# Patient Record
Sex: Male | Born: 1940
Health system: Southern US, Community
[De-identification: ages and names within clinical notes are randomized; demographics above are authoritative.]

## PROBLEM LIST (undated history)

## (undated) DIAGNOSIS — E785 Hyperlipidemia, unspecified: Secondary | ICD-10-CM

## (undated) DIAGNOSIS — I480 Paroxysmal atrial fibrillation: Secondary | ICD-10-CM

## (undated) DIAGNOSIS — C801 Malignant (primary) neoplasm, unspecified: Secondary | ICD-10-CM

## (undated) DIAGNOSIS — I340 Nonrheumatic mitral (valve) insufficiency: Secondary | ICD-10-CM

## (undated) DIAGNOSIS — I639 Cerebral infarction, unspecified: Secondary | ICD-10-CM

## (undated) DIAGNOSIS — E119 Type 2 diabetes mellitus without complications: Secondary | ICD-10-CM

## (undated) DIAGNOSIS — J449 Chronic obstructive pulmonary disease, unspecified: Secondary | ICD-10-CM

## (undated) DIAGNOSIS — K219 Gastro-esophageal reflux disease without esophagitis: Secondary | ICD-10-CM

## (undated) DIAGNOSIS — I10 Essential (primary) hypertension: Secondary | ICD-10-CM

## (undated) HISTORY — PX: TONSILLECTOMY: SUR1361

---

## 2014-10-22 DIAGNOSIS — E782 Mixed hyperlipidemia: Secondary | ICD-10-CM | POA: Insufficient documentation

## 2015-01-15 DIAGNOSIS — C801 Malignant (primary) neoplasm, unspecified: Secondary | ICD-10-CM

## 2015-01-15 HISTORY — DX: Malignant (primary) neoplasm, unspecified: C80.1

## 2015-02-06 DIAGNOSIS — C44300 Unspecified malignant neoplasm of skin of unspecified part of face: Secondary | ICD-10-CM | POA: Insufficient documentation

## 2015-08-18 DIAGNOSIS — I10 Essential (primary) hypertension: Secondary | ICD-10-CM | POA: Insufficient documentation

## 2015-09-16 ENCOUNTER — Encounter: Payer: Self-pay | Admitting: Emergency Medicine

## 2015-09-16 ENCOUNTER — Emergency Department: Payer: Medicare Other

## 2015-09-16 ENCOUNTER — Emergency Department
Admission: EM | Admit: 2015-09-16 | Discharge: 2015-09-16 | Disposition: A | Discharge status: Home / Self Care | Payer: Medicare Other | Attending: Emergency Medicine | Admitting: Emergency Medicine

## 2015-09-16 DIAGNOSIS — R911 Solitary pulmonary nodule: Secondary | ICD-10-CM | POA: Insufficient documentation

## 2015-09-16 DIAGNOSIS — M546 Pain in thoracic spine: Secondary | ICD-10-CM

## 2015-09-16 DIAGNOSIS — R1013 Epigastric pain: Secondary | ICD-10-CM | POA: Insufficient documentation

## 2015-09-16 DIAGNOSIS — I10 Essential (primary) hypertension: Secondary | ICD-10-CM | POA: Insufficient documentation

## 2015-09-16 DIAGNOSIS — M549 Dorsalgia, unspecified: Secondary | ICD-10-CM | POA: Diagnosis present

## 2015-09-16 DIAGNOSIS — IMO0001 Reserved for inherently not codable concepts without codable children: Secondary | ICD-10-CM

## 2015-09-16 DIAGNOSIS — E119 Type 2 diabetes mellitus without complications: Secondary | ICD-10-CM | POA: Insufficient documentation

## 2015-09-16 HISTORY — DX: Type 2 diabetes mellitus without complications: E11.9

## 2015-09-16 HISTORY — DX: Gastro-esophageal reflux disease without esophagitis: K21.9

## 2015-09-16 HISTORY — DX: Hyperlipidemia, unspecified: E78.5

## 2015-09-16 HISTORY — DX: Essential (primary) hypertension: I10

## 2015-09-16 LAB — CBC
HEMATOCRIT: 40.7 % (ref 40.0–52.0)
Hemoglobin: 14 g/dL (ref 13.0–18.0)
MCH: 26.2 pg (ref 26.0–34.0)
MCHC: 34.3 g/dL (ref 32.0–36.0)
MCV: 76.3 fL — ABNORMAL LOW (ref 80.0–100.0)
PLATELETS: 184 10*3/uL (ref 150–440)
RBC: 5.33 MIL/uL (ref 4.40–5.90)
RDW: 15.5 % — AB (ref 11.5–14.5)
WBC: 9 10*3/uL (ref 3.8–10.6)

## 2015-09-16 LAB — BASIC METABOLIC PANEL
Anion gap: 8 (ref 5–15)
BUN: 25 mg/dL — AB (ref 6–20)
CALCIUM: 9.2 mg/dL (ref 8.9–10.3)
CO2: 22 mmol/L (ref 22–32)
CREATININE: 1.17 mg/dL (ref 0.61–1.24)
Chloride: 106 mmol/L (ref 101–111)
GFR calc Af Amer: >60 mL/min (ref >60)
GFR, EST NON AFRICAN AMERICAN: 59 mL/min — AB (ref >60)
GLUCOSE: 166 mg/dL — AB (ref 65–99)
POTASSIUM: 4 mmol/L (ref 3.5–5.1)
SODIUM: 136 mmol/L (ref 135–145)

## 2015-09-16 LAB — HEPATIC FUNCTION PANEL
ALK PHOS: 79 U/L (ref 38–126)
ALT: 36 U/L (ref 17–63)
AST: 60 U/L — ABNORMAL HIGH (ref 15–41)
Albumin: 4.5 g/dL (ref 3.5–5.0)
BILIRUBIN DIRECT: 0.5 mg/dL (ref 0.1–0.5)
BILIRUBIN INDIRECT: 0.5 mg/dL (ref 0.3–0.9)
TOTAL PROTEIN: 7.5 g/dL (ref 6.5–8.1)
Total Bilirubin: 1 mg/dL (ref 0.3–1.2)

## 2015-09-16 LAB — TROPONIN I: Troponin I: <0.03 ng/mL (ref <0.03)

## 2015-09-16 LAB — LIPASE, BLOOD: LIPASE: 36 U/L (ref 11–51)

## 2015-09-16 MED ORDER — IOPAMIDOL (ISOVUE-370) INJECTION 76%
100.0000 mL | Freq: Once | INTRAVENOUS | Status: AC | PRN
  Administered 2015-09-16: 100 mL via INTRAVENOUS

## 2015-09-16 NOTE — ED Provider Notes (Signed)
[facility] Emergency Department Provider Note   ____________________________________________   First MD Initiated Contact with Patient 09/16/15 [phone]     (approximate)  I have reviewed the triage vital signs and the nursing notes.   HISTORY  Chief Complaint Chest Pain and Back Pain    HPI Gabriel Brewer is a 75 y.o. male who comes into the hospital today with some back pain and epigastric pain. The patient reports he woke up at 2 AM with back pain. He reports that he dealt with it at home until about 45 minutes prior to coming in. He felt a lump or a knot in his epigastric area. He had some shortness of breath as well. He felt sweaty at home. He reports that the epigastric pain eased but his back pain has continued. He reports that he does have a history of back pain but is typically lower than this. The patient had a colonoscopy done yesterday. He had no polyps removed reports everything was okay. The patient denies radiation of this pain anywhere. After his colonoscopy he had some eggs and toast as well as hamburger steak with onions. He reports that he had some french fries as well. The patient rates his pain currently a 3-4 out of 10 in intensity. The patient reports that the pain is a dull pain. He has some nausea and no vomiting.   Past Medical History:  Diagnosis Date  . Diabetes mellitus without complication ([place])   . GERD (gastroesophageal reflux disease)   . Hyperlipidemia   . Hypertension     There are no active problems to display for this patient.   Past Surgical History:  Procedure Laterality Date  . TONSILLECTOMY      Prior to Admission medications   Not on File    Allergies Review of patient's allergies indicates no known allergies.  No family history on file.  Social History Social History  Substance Use Topics  . Smoking status: Never Smoker  . Smokeless tobacco: Not on file  . Alcohol use No    Review of  Systems Constitutional: No fever/chills Eyes: No visual changes. ENT: No sore throat. Cardiovascular: Denies chest pain. Respiratory: Denies shortness of breath. Gastrointestinal: No abdominal pain.  No nausea, no vomiting.  No diarrhea.  No constipation. Genitourinary: Negative for dysuria. Musculoskeletal: Negative for back pain. Skin: Negative for rash. Neurological: Negative for headaches, focal weakness or numbness.  10-point ROS otherwise negative.  ____________________________________________   PHYSICAL EXAM:  VITAL SIGNS: ED Triage Vitals  Enc Vitals Group     BP 09/16/15 0630 (!) 154/80     Pulse Rate 09/16/15 0631 78     Resp 09/16/15 0631 20     Temp 09/16/15 0633 97.3 F (36.3 C)     Temp Source 09/16/15 0633 Oral     SpO2 09/16/15 0631 98 %     Weight 09/16/15 0631 180 lb (81.6 kg)     Height 09/16/15 0631 5\' 11"  (1.803 m)     Head Circumference --      Peak Flow --      Pain Score 09/16/15 0633 5     Pain Loc --      Pain Edu? --      Excl. in [place]? --     Constitutional: Alert and oriented. Well appearing and in moderate distress. Eyes: Conjunctivae are normal. PERRL. EOMI. Head: Atraumatic. Nose: No congestion/rhinnorhea. Mouth/Throat: Mucous membranes are moist.  Oropharynx non-erythematous. Cardiovascular: Normal rate, regular rhythm.  Grossly normal heart sounds.  Good peripheral circulation. Respiratory: Normal respiratory effort.  No retractions. Lungs CTAB. Gastrointestinal: Soft with epigastric abd pain to palpation. No distention. Positive bowel sounds Musculoskeletal: No lower extremity tenderness nor edema.   Neurologic:  Normal speech and language.  Skin:  Skin is warm, dry and intact.  Psychiatric: Mood and affect are normal.   ____________________________________________   LABS (all labs ordered are listed, but only abnormal results are displayed)  Labs Reviewed  BASIC METABOLIC PANEL - Abnormal; Notable for the following:        Result Value   Glucose, Bld 166 (*)    BUN 25 (*)    GFR calc non Af Amer 59 (*)    All other components within normal limits  CBC - Abnormal; Notable for the following:    MCV 76.3 (*)    RDW 15.5 (*)    All other components within normal limits  HEPATIC FUNCTION PANEL - Abnormal; Notable for the following:    AST 60 (*)    All other components within normal limits  TROPONIN I  LIPASE, BLOOD   ____________________________________________  EKG  ED ECG REPORT I, Loney Hering, the attending physician, personally viewed and interpreted this ECG.   Date: 09/16/2015  EKG Time: 629  Rate: 64  Rhythm: normal sinus rhythm  Axis: normal  Intervals:none  ST&T Change: none  ____________________________________________  RADIOLOGY  CXR ____________________________________________   PROCEDURES  Procedure(s) performed: None  Procedures  Critical Care performed: No  ____________________________________________   INITIAL IMPRESSION / ASSESSMENT AND PLAN / ED COURSE  Pertinent labs & imaging results that were available during my care of the patient were reviewed by me and considered in my medical decision making (see chart for details).  This is a 75 year old male who comes into the hospital today with some epigastric pain and back pain. We will check some blood work as well as chest x-ray and reassess the patient. I will also do a CT of the patient's chest and pelvis given his recent procedure as well as his pain that radiates into his back. The patient's care was signed out to Dr. Mena Goes who will follow-up the results of the CT scan and the blood work.  Clinical Course  Value Comment By Time  DG Chest 2 View No active cardiopulmonary disease. Minimal atelectasis of both lung bases.   Loney Hering, MD 09/02 [phone]     ____________________________________________   FINAL CLINICAL IMPRESSION(S) / ED DIAGNOSES  Final diagnoses:  Epigastric pain   Midline thoracic back pain      NEW MEDICATIONS STARTED DURING THIS VISIT:  New Prescriptions   No medications on file     Note:  This document was prepared using Dragon voice recognition software and may include unintentional dictation errors.    Loney Hering, MD 09/16/15 [phone]

## 2015-09-16 NOTE — Discharge Instructions (Addendum)
You have been seen in the Emergency Department (ED)  today for back pain.    CT Scan showed a small nodule in your lung: please follow-up with Dr. Titus Dubin about this: Small pulmonary nodules identified. Largest has a mean diameter of 5 mm in the left lower lobe. No follow-up needed if patient is low-risk (and has no known or suspected primary neoplasm). Non-contrast chest CT can be considered in 12 months if patient is high-risk.   Please follow up with your doctor as soon as possible regarding today's ED visit and your back pain.  Return to the ED for worsening back pain, fever, weakness or numbness of either leg, or if you develop either (1) an inability to urinate or have bowel movements, or (2) loss of your ability to control your bathroom functions (if you start having "accidents"), or if you develop other new symptoms that concern you.

## 2015-09-16 NOTE — ED Notes (Signed)
MD Webster at bedside 

## 2015-09-16 NOTE — ED Provider Notes (Signed)
----------------------------------------- 8:49 AM on 09/16/2015 -----------------------------------------  Checked in with patient, currently at CT. His wife reports that he is feeling better, and he is been resting well. Labs including LFTs and troponin are reassuring. Plan at this time is to follow-up on CT scan, also I will check a second troponin at about 10:15 AM. Continue to observe.  Dg Chest 2 View  Result Date: 09/16/2015 CLINICAL DATA:  Woke up at 2 a.m. with mid back pain and substernal chest pain. EXAM: CHEST  2 VIEW COMPARISON:  None. FINDINGS: The heart size and mediastinal contours are within normal limits. Biapical pleural thickening is noted. There is no focal infiltrate, pulmonary edema, or pleural effusion. Minimal atelectasis of the lung bases are noted. Degenerative joint changes of bilateral acromioclavicular joints are identified. IMPRESSION: No active cardiopulmonary disease. Minimal atelectasis of both lung bases. Electronically Signed   By: Abelardo Diesel M.D.   On: 09/16/2015 07:32   Ct Angio Chest/abd/pel For Dissection W And/or Wo Contrast  Result Date: 09/16/2015 CLINICAL DATA:  Pt states intense back pain started early this am followed by chest and abdominal pain also. PT states is feels like he has a big knot in his chest. Pt denies hx of surgery to C/A/P. Pt with hx of skin ca. 15ml of Isovue 370 used. EXAM: CT ANGIOGRAPHY CHEST, ABDOMEN AND PELVIS TECHNIQUE: Multidetector CT imaging through the chest, abdomen and pelvis was performed using the standard protocol during bolus administration of intravenous contrast. Multiplanar reconstructed images and MIPs were obtained and reviewed to evaluate the vascular anatomy. CONTRAST:  100 cc Isovue 370 COMPARISON:  Chest x-ray 09/15/2015 FINDINGS: CT CHEST FINDINGS Heart: Heart size is normal. No imaged pericardial effusion or significant coronary artery calcifications. Vascular structures: Thoracic aorta is normally opacified. No  evidence for dissection. There is atherosclerotic calcification of the thoracic aorta not associated with aneurysm. The pulmonary arteries are grossly well opacified and no large pulmonary emboli are present. Mediastinum/thyroid: The visualized portion of the thyroid gland has a normal appearance. No mediastinal, hilar, or axillary adenopathy. Small hiatal hernia is present. Otherwise the esophagus is normal in appearance. Lungs/Airways: Scattered emphysematous changes are present. Mild perihilar peribronchial thickening. Small pulmonary nodules are present, the majority of which are 3 mm and smaller. These are identified within the right upper lobe, right lower lobe, and left lower lobe. The largest nodule is identified within the left lower lobe on image 39 of series 7 and measures 4 x 6 mm. There are no pulmonary consolidations, pleural effusions. Chest wall/osseous: Visualized osseous structures have a normal appearance. CT ABDOMEN AND PELVIS FINDINGS Hepatobiliary: No focal liver lesions are identified. The gallbladder is distended and contains layering dependent sludge or stones. Pancreas: Normal in appearance. Spleen: Normal in appearance. Adrenals/Urinary Tract: The adrenal glands are normal in appearance. Kidneys are normal in enhancement bilaterally. No hydronephrosis or solid renal mass identified. No ureteral obstruction. Stomach/Bowel: Hiatal hernia is present. Otherwise the stomach has a normal appearance. Small bowel loops are normal in caliber and wall thickness. The appendix is well seen and has a normal appearance. Numerous colonic diverticula are present but there is no evidence for acute diverticulitis. Vascular/Lymphatic: There is atherosclerotic calcification of the abdominal aorta and its branches. No evidence for dissection. No aneurysm. There are 2 left renal arteries and a single right renal artery. No significant atherosclerosis at the origins of the celiac, SMA, or IMA. No retroperitoneal  or mesenteric adenopathy. Reproductive: Prostate gland is enlarged with a prominent impression  on the base of bladder. Seminal vesicles are normal in appearance. Other: None Musculoskeletal: Mild degenerative changes are identified throughout the lumbar spine. Significant disc height loss and sclerosis identified at L2-3 with large posterior osteophyte identified at same level. No suspicious lytic or blastic lesions are identified. Review of the MIP images confirms the above findings. IMPRESSION: 1. Atherosclerosis of the thoracic and abdominal aorta not associated with aneurysm or dissection. 2. Small pulmonary nodules identified. Largest has a mean diameter of 5 mm in the left lower lobe. No follow-up needed if patient is low-risk (and has no known or suspected primary neoplasm). Non-contrast chest CT can be considered in 12 months if patient is high-risk. This recommendation follows the consensus statement: Guidelines for Management of Incidental Pulmonary Nodules Detected on CT Images:From the Fleischner Society 2017; published online before print (10.1148/radiol.IJ:2314499). 3. Gallbladder sludge or layering stones. 4. Hiatal hernia. 5. Colonic diverticulosis. 6. Prostatic enlargement. 7. Spondylosis primarily at L2-3. Electronically Signed   By: Nolon Nations M.D.   On: 09/16/2015 09:18     ----------------------------------------- 10:21 AM on 09/16/2015 -----------------------------------------  Patient reports no pain or symptoms at present. We will obtain a second troponin, if negative anticipate discharge to home with follow-up with his doctor. Discussed lung nodule with the patient, and reports his primary is a already aware and has been following these previous.   ----------------------------------------- 11:17 AM on 09/16/2015 -----------------------------------------  Pain and symptom-free. Return precautions and treatment recommendations and follow-up discussed with the patient who  is agreeable with the plan.    Delman Kitten, MD 09/16/15 [phone]

## 2015-09-16 NOTE — ED Notes (Signed)
Pt alert and oriented X4, active, cooperative, pt in NAD. RR even and unlabored, color WNL.  Pt informed to return if any life threatening symptoms occur.   

## 2015-09-16 NOTE — ED Triage Notes (Signed)
Patient reports woke around 2am with mid back pain.  Report approximated 30 minutes prior to arrival started having chest pain.

## 2015-09-27 DIAGNOSIS — I340 Nonrheumatic mitral (valve) insufficiency: Secondary | ICD-10-CM | POA: Insufficient documentation

## 2015-09-27 DIAGNOSIS — I059 Rheumatic mitral valve disease, unspecified: Secondary | ICD-10-CM | POA: Insufficient documentation

## 2015-09-27 DIAGNOSIS — I058 Other rheumatic mitral valve diseases: Secondary | ICD-10-CM | POA: Insufficient documentation

## 2016-01-22 DIAGNOSIS — D1801 Hemangioma of skin and subcutaneous tissue: Secondary | ICD-10-CM | POA: Diagnosis not present

## 2016-01-22 DIAGNOSIS — L853 Xerosis cutis: Secondary | ICD-10-CM | POA: Diagnosis not present

## 2016-01-22 DIAGNOSIS — Z85828 Personal history of other malignant neoplasm of skin: Secondary | ICD-10-CM | POA: Diagnosis not present

## 2016-01-22 DIAGNOSIS — L570 Actinic keratosis: Secondary | ICD-10-CM | POA: Diagnosis not present

## 2016-01-22 DIAGNOSIS — X32XXXA Exposure to sunlight, initial encounter: Secondary | ICD-10-CM | POA: Diagnosis not present

## 2016-01-22 DIAGNOSIS — L821 Other seborrheic keratosis: Secondary | ICD-10-CM | POA: Diagnosis not present

## 2016-02-05 DIAGNOSIS — I10 Essential (primary) hypertension: Secondary | ICD-10-CM | POA: Diagnosis not present

## 2016-02-05 DIAGNOSIS — I340 Nonrheumatic mitral (valve) insufficiency: Secondary | ICD-10-CM | POA: Diagnosis not present

## 2016-02-22 DIAGNOSIS — H348112 Central retinal vein occlusion, right eye, stable: Secondary | ICD-10-CM | POA: Diagnosis not present

## 2016-02-22 DIAGNOSIS — H35372 Puckering of macula, left eye: Secondary | ICD-10-CM | POA: Diagnosis not present

## 2016-02-22 DIAGNOSIS — H348322 Tributary (branch) retinal vein occlusion, left eye, stable: Secondary | ICD-10-CM | POA: Diagnosis not present

## 2016-02-22 DIAGNOSIS — E113293 Type 2 diabetes mellitus with mild nonproliferative diabetic retinopathy without macular edema, bilateral: Secondary | ICD-10-CM | POA: Diagnosis not present

## 2016-04-16 DIAGNOSIS — R911 Solitary pulmonary nodule: Secondary | ICD-10-CM | POA: Diagnosis not present

## 2016-04-16 DIAGNOSIS — I10 Essential (primary) hypertension: Secondary | ICD-10-CM | POA: Diagnosis not present

## 2016-04-16 DIAGNOSIS — E782 Mixed hyperlipidemia: Secondary | ICD-10-CM | POA: Diagnosis not present

## 2016-04-16 DIAGNOSIS — R7309 Other abnormal glucose: Secondary | ICD-10-CM | POA: Diagnosis not present

## 2016-04-23 DIAGNOSIS — J439 Emphysema, unspecified: Secondary | ICD-10-CM | POA: Diagnosis not present

## 2016-04-23 DIAGNOSIS — R918 Other nonspecific abnormal finding of lung field: Secondary | ICD-10-CM | POA: Diagnosis not present

## 2016-05-31 DIAGNOSIS — H34811 Central retinal vein occlusion, right eye, with macular edema: Secondary | ICD-10-CM | POA: Diagnosis not present

## 2016-05-31 DIAGNOSIS — E113293 Type 2 diabetes mellitus with mild nonproliferative diabetic retinopathy without macular edema, bilateral: Secondary | ICD-10-CM | POA: Diagnosis not present

## 2016-05-31 DIAGNOSIS — H348322 Tributary (branch) retinal vein occlusion, left eye, stable: Secondary | ICD-10-CM | POA: Diagnosis not present

## 2016-05-31 DIAGNOSIS — H35372 Puckering of macula, left eye: Secondary | ICD-10-CM | POA: Diagnosis not present

## 2016-07-02 DIAGNOSIS — H348322 Tributary (branch) retinal vein occlusion, left eye, stable: Secondary | ICD-10-CM | POA: Diagnosis not present

## 2016-07-02 DIAGNOSIS — H34811 Central retinal vein occlusion, right eye, with macular edema: Secondary | ICD-10-CM | POA: Diagnosis not present

## 2016-07-22 DIAGNOSIS — L570 Actinic keratosis: Secondary | ICD-10-CM | POA: Diagnosis not present

## 2016-07-22 DIAGNOSIS — L821 Other seborrheic keratosis: Secondary | ICD-10-CM | POA: Diagnosis not present

## 2016-07-22 DIAGNOSIS — X32XXXA Exposure to sunlight, initial encounter: Secondary | ICD-10-CM | POA: Diagnosis not present

## 2016-07-22 DIAGNOSIS — D485 Neoplasm of uncertain behavior of skin: Secondary | ICD-10-CM | POA: Diagnosis not present

## 2016-07-30 DIAGNOSIS — H348322 Tributary (branch) retinal vein occlusion, left eye, stable: Secondary | ICD-10-CM | POA: Diagnosis not present

## 2016-07-30 DIAGNOSIS — H34811 Central retinal vein occlusion, right eye, with macular edema: Secondary | ICD-10-CM | POA: Diagnosis not present

## 2016-08-05 DIAGNOSIS — I10 Essential (primary) hypertension: Secondary | ICD-10-CM | POA: Diagnosis not present

## 2016-08-05 DIAGNOSIS — I340 Nonrheumatic mitral (valve) insufficiency: Secondary | ICD-10-CM | POA: Diagnosis not present

## 2016-08-06 DIAGNOSIS — L570 Actinic keratosis: Secondary | ICD-10-CM | POA: Diagnosis not present

## 2016-08-06 DIAGNOSIS — X32XXXA Exposure to sunlight, initial encounter: Secondary | ICD-10-CM | POA: Diagnosis not present

## 2016-09-10 DIAGNOSIS — H348322 Tributary (branch) retinal vein occlusion, left eye, stable: Secondary | ICD-10-CM | POA: Diagnosis not present

## 2016-09-10 DIAGNOSIS — H34811 Central retinal vein occlusion, right eye, with macular edema: Secondary | ICD-10-CM | POA: Diagnosis not present

## 2016-11-03 ENCOUNTER — Encounter: Payer: Self-pay | Admitting: Specialist

## 2016-11-03 ENCOUNTER — Inpatient Hospital Stay
Admission: EM | Admit: 2016-11-03 | Discharge: 2016-11-07 | DRG: 871 | Disposition: A | Discharge status: Home / Self Care | Payer: Medicare Other | Attending: Internal Medicine | Admitting: Internal Medicine

## 2016-11-03 ENCOUNTER — Emergency Department: Payer: Medicare Other

## 2016-11-03 DIAGNOSIS — I480 Paroxysmal atrial fibrillation: Secondary | ICD-10-CM | POA: Diagnosis present

## 2016-11-03 DIAGNOSIS — K219 Gastro-esophageal reflux disease without esophagitis: Secondary | ICD-10-CM | POA: Diagnosis present

## 2016-11-03 DIAGNOSIS — R05 Cough: Secondary | ICD-10-CM | POA: Diagnosis not present

## 2016-11-03 DIAGNOSIS — R0902 Hypoxemia: Secondary | ICD-10-CM | POA: Diagnosis present

## 2016-11-03 DIAGNOSIS — Z87891 Personal history of nicotine dependence: Secondary | ICD-10-CM

## 2016-11-03 DIAGNOSIS — J181 Lobar pneumonia, unspecified organism: Secondary | ICD-10-CM | POA: Diagnosis not present

## 2016-11-03 DIAGNOSIS — I5033 Acute on chronic diastolic (congestive) heart failure: Secondary | ICD-10-CM | POA: Diagnosis present

## 2016-11-03 DIAGNOSIS — Z7982 Long term (current) use of aspirin: Secondary | ICD-10-CM

## 2016-11-03 DIAGNOSIS — A419 Sepsis, unspecified organism: Secondary | ICD-10-CM | POA: Diagnosis present

## 2016-11-03 DIAGNOSIS — J189 Pneumonia, unspecified organism: Secondary | ICD-10-CM | POA: Diagnosis not present

## 2016-11-03 DIAGNOSIS — Z85828 Personal history of other malignant neoplasm of skin: Secondary | ICD-10-CM | POA: Diagnosis not present

## 2016-11-03 DIAGNOSIS — Z7984 Long term (current) use of oral hypoglycemic drugs: Secondary | ICD-10-CM | POA: Diagnosis not present

## 2016-11-03 DIAGNOSIS — D72829 Elevated white blood cell count, unspecified: Secondary | ICD-10-CM | POA: Diagnosis not present

## 2016-11-03 DIAGNOSIS — R0603 Acute respiratory distress: Secondary | ICD-10-CM | POA: Diagnosis not present

## 2016-11-03 DIAGNOSIS — E119 Type 2 diabetes mellitus without complications: Secondary | ICD-10-CM | POA: Diagnosis present

## 2016-11-03 DIAGNOSIS — R0600 Dyspnea, unspecified: Secondary | ICD-10-CM

## 2016-11-03 DIAGNOSIS — J441 Chronic obstructive pulmonary disease with (acute) exacerbation: Secondary | ICD-10-CM | POA: Diagnosis not present

## 2016-11-03 DIAGNOSIS — Z8673 Personal history of transient ischemic attack (TIA), and cerebral infarction without residual deficits: Secondary | ICD-10-CM

## 2016-11-03 DIAGNOSIS — Z79899 Other long term (current) drug therapy: Secondary | ICD-10-CM

## 2016-11-03 DIAGNOSIS — J440 Chronic obstructive pulmonary disease with acute lower respiratory infection: Secondary | ICD-10-CM | POA: Diagnosis present

## 2016-11-03 DIAGNOSIS — I110 Hypertensive heart disease with heart failure: Secondary | ICD-10-CM | POA: Diagnosis present

## 2016-11-03 DIAGNOSIS — E785 Hyperlipidemia, unspecified: Secondary | ICD-10-CM | POA: Diagnosis present

## 2016-11-03 DIAGNOSIS — I499 Cardiac arrhythmia, unspecified: Secondary | ICD-10-CM | POA: Diagnosis not present

## 2016-11-03 DIAGNOSIS — I340 Nonrheumatic mitral (valve) insufficiency: Secondary | ICD-10-CM | POA: Diagnosis present

## 2016-11-03 DIAGNOSIS — E876 Hypokalemia: Secondary | ICD-10-CM | POA: Diagnosis present

## 2016-11-03 DIAGNOSIS — I10 Essential (primary) hypertension: Secondary | ICD-10-CM | POA: Diagnosis not present

## 2016-11-03 DIAGNOSIS — I4891 Unspecified atrial fibrillation: Secondary | ICD-10-CM | POA: Diagnosis not present

## 2016-11-03 DIAGNOSIS — R0602 Shortness of breath: Secondary | ICD-10-CM | POA: Diagnosis not present

## 2016-11-03 HISTORY — DX: Malignant (primary) neoplasm, unspecified: C80.1

## 2016-11-03 LAB — INFLUENZA PANEL BY PCR (TYPE A & B)
INFLAPCR: NEGATIVE
Influenza B By PCR: NEGATIVE

## 2016-11-03 LAB — COMPREHENSIVE METABOLIC PANEL
ALBUMIN: 3.2 g/dL — AB (ref 3.5–5.0)
ALK PHOS: 150 U/L — AB (ref 38–126)
ALT: 38 U/L (ref 17–63)
AST: 42 U/L — AB (ref 15–41)
Anion gap: 11 (ref 5–15)
BILIRUBIN TOTAL: 0.7 mg/dL (ref 0.3–1.2)
BUN: 20 mg/dL (ref 6–20)
CO2: 22 mmol/L (ref 22–32)
CREATININE: 1.24 mg/dL (ref 0.61–1.24)
Calcium: 8.4 mg/dL — ABNORMAL LOW (ref 8.9–10.3)
Chloride: 100 mmol/L — ABNORMAL LOW (ref 101–111)
GFR calc Af Amer: >60 mL/min (ref >60)
GFR calc non Af Amer: 55 mL/min — ABNORMAL LOW (ref >60)
GLUCOSE: 122 mg/dL — AB (ref 65–99)
Potassium: 3.5 mmol/L (ref 3.5–5.1)
Sodium: 133 mmol/L — ABNORMAL LOW (ref 135–145)
TOTAL PROTEIN: 7.1 g/dL (ref 6.5–8.1)

## 2016-11-03 LAB — TROPONIN I
TROPONIN I: 0.05 ng/mL — AB (ref <0.03)
Troponin I: 0.05 ng/mL (ref <0.03)

## 2016-11-03 LAB — CBC
HEMATOCRIT: 37.3 % — AB (ref 40.0–52.0)
HEMOGLOBIN: 12.3 g/dL — AB (ref 13.0–18.0)
MCH: 26.4 pg (ref 26.0–34.0)
MCHC: 32.9 g/dL (ref 32.0–36.0)
MCV: 80.2 fL (ref 80.0–100.0)
Platelets: 279 10*3/uL (ref 150–440)
RBC: 4.65 MIL/uL (ref 4.40–5.90)
RDW: 16.3 % — ABNORMAL HIGH (ref 11.5–14.5)
WBC: 15.6 10*3/uL — ABNORMAL HIGH (ref 3.8–10.6)

## 2016-11-03 LAB — BRAIN NATRIURETIC PEPTIDE: B Natriuretic Peptide: 70 pg/mL (ref 0.0–100.0)

## 2016-11-03 LAB — LACTIC ACID, PLASMA: Lactic Acid, Venous: 1.5 mmol/L (ref 0.5–1.9)

## 2016-11-03 MED ORDER — GUAIFENESIN ER 600 MG PO TB12
600.0000 mg | ORAL_TABLET | Freq: Two times a day (BID) | ORAL | Status: DC
  Administered 2016-11-03 – 2016-11-07 (×8): 600 mg via ORAL
  Filled 2016-11-03 (×8): qty 1

## 2016-11-03 MED ORDER — VITAMIN D3 25 MCG (1000 UNIT) PO TABS
4000.0000 [IU] | ORAL_TABLET | ORAL | Status: DC
  Administered 2016-11-03: 4000 [IU] via ORAL
  Filled 2016-11-03 (×2): qty 4

## 2016-11-03 MED ORDER — PANTOPRAZOLE SODIUM 40 MG PO TBEC
40.0000 mg | DELAYED_RELEASE_TABLET | Freq: Every day | ORAL | Status: DC
  Administered 2016-11-03 – 2016-11-07 (×5): 40 mg via ORAL
  Filled 2016-11-03 (×5): qty 1

## 2016-11-03 MED ORDER — BUDESONIDE 0.5 MG/2ML IN SUSP
0.5000 mg | Freq: Two times a day (BID) | RESPIRATORY_TRACT | Status: DC
  Administered 2016-11-03 – 2016-11-07 (×8): 0.5 mg via RESPIRATORY_TRACT
  Filled 2016-11-03 (×8): qty 2

## 2016-11-03 MED ORDER — ACETAMINOPHEN 325 MG PO TABS: 650.0000 mg | ORAL_TABLET | Freq: Four times a day (QID) | ORAL | Status: DC | PRN

## 2016-11-03 MED ORDER — AZITHROMYCIN 500 MG IV SOLR
500.0000 mg | Freq: Once | INTRAVENOUS | Status: AC
  Administered 2016-11-03: 500 mg via INTRAVENOUS
  Filled 2016-11-03: qty 500

## 2016-11-03 MED ORDER — AMLODIPINE BESYLATE 5 MG PO TABS
5.0000 mg | ORAL_TABLET | Freq: Every evening | ORAL | Status: DC
  Administered 2016-11-03 – 2016-11-04 (×2): 5 mg via ORAL
  Filled 2016-11-03 (×2): qty 1

## 2016-11-03 MED ORDER — ACETAMINOPHEN 650 MG RE SUPP: 650.0000 mg | Freq: Four times a day (QID) | RECTAL | Status: DC | PRN

## 2016-11-03 MED ORDER — GUAIFENESIN 100 MG/5ML PO SOLN
5.0000 mL | ORAL | Status: DC | PRN
  Administered 2016-11-05: 100 mg via ORAL
  Filled 2016-11-03 (×2): qty 5

## 2016-11-03 MED ORDER — ENOXAPARIN SODIUM 40 MG/0.4ML ~~LOC~~ SOLN
40.0000 mg | SUBCUTANEOUS | Status: DC
  Administered 2016-11-03 – 2016-11-04 (×2): 40 mg via SUBCUTANEOUS
  Filled 2016-11-03 (×2): qty 0.4

## 2016-11-03 MED ORDER — ONDANSETRON HCL 4 MG/2ML IJ SOLN: 4.0000 mg | Freq: Four times a day (QID) | INTRAMUSCULAR | Status: DC | PRN

## 2016-11-03 MED ORDER — METFORMIN HCL 500 MG PO TABS
500.0000 mg | ORAL_TABLET | Freq: Two times a day (BID) | ORAL | Status: DC
  Administered 2016-11-04 – 2016-11-05 (×4): 500 mg via ORAL
  Filled 2016-11-03 (×4): qty 1

## 2016-11-03 MED ORDER — SODIUM CHLORIDE 0.9 % IV BOLUS (SEPSIS)
1000.0000 mL | Freq: Once | INTRAVENOUS | Status: AC
  Administered 2016-11-03: 1000 mL via INTRAVENOUS

## 2016-11-03 MED ORDER — SODIUM CHLORIDE 0.9 % IV BOLUS (SEPSIS)
500.0000 mL | Freq: Once | INTRAVENOUS | Status: AC
Start: 2016-11-03 — End: 2016-11-03
  Administered 2016-11-03: 500 mL via INTRAVENOUS

## 2016-11-03 MED ORDER — ATORVASTATIN CALCIUM 10 MG PO TABS
10.0000 mg | ORAL_TABLET | Freq: Every evening | ORAL | Status: DC
  Administered 2016-11-03 – 2016-11-06 (×4): 10 mg via ORAL
  Filled 2016-11-03 (×4): qty 1

## 2016-11-03 MED ORDER — IPRATROPIUM-ALBUTEROL 0.5-2.5 (3) MG/3ML IN SOLN
3.0000 mL | Freq: Four times a day (QID) | RESPIRATORY_TRACT | Status: DC
  Administered 2016-11-03 – 2016-11-06 (×11): 3 mL via RESPIRATORY_TRACT
  Filled 2016-11-03 (×11): qty 3

## 2016-11-03 MED ORDER — CEFTRIAXONE SODIUM IN DEXTROSE 20 MG/ML IV SOLN
1.0000 g | Freq: Once | INTRAVENOUS | Status: AC
  Administered 2016-11-03: 1 g via INTRAVENOUS
  Filled 2016-11-03: qty 50

## 2016-11-03 MED ORDER — BENAZEPRIL HCL 20 MG PO TABS
40.0000 mg | ORAL_TABLET | Freq: Every evening | ORAL | Status: DC
  Administered 2016-11-03 – 2016-11-06 (×4): 40 mg via ORAL
  Filled 2016-11-03: qty 2
  Filled 2016-11-03: qty 1
  Filled 2016-11-03 (×2): qty 2

## 2016-11-03 MED ORDER — DEXTROMETHORPHAN POLISTIREX ER 30 MG/5ML PO SUER
30.0000 mg | Freq: Two times a day (BID) | ORAL | Status: DC
  Administered 2016-11-03 – 2016-11-07 (×8): 30 mg via ORAL
  Filled 2016-11-03 (×8): qty 5

## 2016-11-03 MED ORDER — AZITHROMYCIN 250 MG PO TABS
250.0000 mg | ORAL_TABLET | Freq: Every day | ORAL | Status: DC
  Administered 2016-11-04 – 2016-11-06 (×3): 250 mg via ORAL
  Filled 2016-11-03 (×3): qty 1

## 2016-11-03 MED ORDER — ASPIRIN EC 81 MG PO TBEC
81.0000 mg | DELAYED_RELEASE_TABLET | Freq: Every day | ORAL | Status: DC
  Administered 2016-11-03 – 2016-11-06 (×4): 81 mg via ORAL
  Filled 2016-11-03 (×5): qty 1

## 2016-11-03 MED ORDER — CEFTRIAXONE SODIUM 1 G IJ SOLR
1.0000 g | INTRAMUSCULAR | Status: DC
  Administered 2016-11-04 – 2016-11-06 (×3): 1 g via INTRAVENOUS
  Filled 2016-11-03 (×4): qty 10

## 2016-11-03 MED ORDER — ONDANSETRON HCL 4 MG PO TABS: 4.0000 mg | ORAL_TABLET | Freq: Four times a day (QID) | ORAL | Status: DC | PRN

## 2016-11-03 MED ORDER — AMLODIPINE BESY-BENAZEPRIL HCL 5-40 MG PO CAPS: 1.0000 | ORAL_CAPSULE | Freq: Every day | ORAL | Status: DC

## 2016-11-03 MED ORDER — ALBUTEROL SULFATE (2.5 MG/3ML) 0.083% IN NEBU
5.0000 mg | INHALATION_SOLUTION | Freq: Once | RESPIRATORY_TRACT | Status: AC
  Administered 2016-11-03: 5 mg via RESPIRATORY_TRACT
  Filled 2016-11-03: qty 6

## 2016-11-03 MED ORDER — DM-GUAIFENESIN ER 30-600 MG PO TB12: 1.0000 | ORAL_TABLET | Freq: Two times a day (BID) | ORAL | Status: DC

## 2016-11-03 NOTE — ED Provider Notes (Signed)
[facility]  ____________________________________________   I have reviewed the triage vital signs and the nursing notes.   HISTORY  Chief Complaint Shortness of Breath and Weakness    HPI Gabriel Brewer is a 76 y.o. male Who is pretty healthy at baseline hypertension hyperlipidemia, diabetes presents today complaining of cough and fever. He has a low-grade fever for the last week or 2 while he was on a cruise ship. That became acutely worse with productive cough and higher fever over the last few days. He denies any antibiotic use, he did see a doctor there that told him to use Afrin. Patient has had no dysuria no urinary frequency, denies any other complaints for significant cough and fever.    Past Medical History:  Diagnosis Date  . Diabetes mellitus without complication ([place])   . GERD (gastroesophageal reflux disease)   . Hyperlipidemia   . Hypertension     There are no active problems to display for this patient.   Past Surgical History:  Procedure Laterality Date  . TONSILLECTOMY      Prior to Admission medications   Not on File    Allergies Patient has no known allergies.  No family history on file.  Social History Social History  Substance Use Topics  . Smoking status: Never Smoker  . Smokeless tobacco: Not on file  . Alcohol use No    Review of Systems Constitutional: No fever/chills Eyes: No visual changes. ENT: No sore throat. No stiff neck no neck pain Cardiovascular: Denies chest pain. Respiratory: Denies shortness of breath. Gastrointestinal:   no vomiting.  No diarrhea.  No constipation. Genitourinary: Negative for dysuria. Musculoskeletal: Negative lower extremity swelling Skin: Negative for rash. Neurological: Negative for severe headaches, focal weakness or numbness.   ____________________________________________   PHYSICAL EXAM:  VITAL SIGNS: ED Triage Vitals   Enc Vitals Group     BP 11/03/16 1512 (!) 143/73     Pulse Rate 11/03/16 1512 (!) 105     Resp 11/03/16 1512 (!) 26     Temp 11/03/16 1512 (!) 100.7 F (38.2 C)     Temp Source 11/03/16 1512 Oral     SpO2 11/03/16 1512 94 %     Weight 11/03/16 1513 175 lb (79.4 kg)     Height 11/03/16 1513 5\' 11"  (1.803 m)     Head Circumference --      Peak Flow --      Pain Score --      Pain Loc --      Pain Edu? --      Excl. in [place]? --     Constitutional: Alert and oriented. Well appearing and in no acute distress. Eyes: Conjunctivae are normal Head: Atraumatic HEENT: No congestion/rhinnorhea. Mucous membranes are moist.  Oropharynx non-erythematous Neck:   Nontender with no meningismus, no masses, no stridor Cardiovascular: Normal rate, regular rhythm. Grossly normal heart sounds.  Good peripheral circulation. Respiratory: Normal respiratory effort.  No retractions. there is occasional cough, there are rhonchi noted especially on the left slight wheeze also noted Abdominal: Soft and nontender. No distention. No guarding no rebound Back:  There is no focal tenderness or step off.  there is no midline tenderness there are no lesions noted. there is no CVA tenderness Musculoskeletal: No lower extremity tenderness, no upper extremity tenderness. No joint effusions, no DVT signs strong distal pulses no edema Neurologic:  Normal speech and language. No gross focal neurologic deficits are appreciated.  Skin:  Skin is warm, dry and intact. No rash noted. Psychiatric: Mood and affect are normal. Speech and behavior are normal.  ____________________________________________   LABS (all labs ordered are listed, but only abnormal results are displayed)  Labs Reviewed  TROPONIN I - Abnormal; Notable for the following:       Result Value   Troponin I 0.05 (*)    All other components within normal limits  CBC - Abnormal; Notable for the following:    WBC 15.6 (*)    Hemoglobin 12.3 (*)    HCT 37.3  (*)    RDW 16.3 (*)    All other components within normal limits  COMPREHENSIVE METABOLIC PANEL - Abnormal; Notable for the following:    Sodium 133 (*)    Chloride 100 (*)    Glucose, Bld 122 (*)    Calcium 8.4 (*)    Albumin 3.2 (*)    AST 42 (*)    Alkaline Phosphatase 150 (*)    GFR calc non Af Amer 55 (*)    All other components within normal limits  CULTURE, BLOOD (ROUTINE X 2)  CULTURE, BLOOD (ROUTINE X 2)  LACTIC ACID, PLASMA  LACTIC ACID, PLASMA  BRAIN NATRIURETIC PEPTIDE    Pertinent labs  results that were available during my care of the patient were reviewed by me and considered in my medical decision making (see chart for details). ____________________________________________  EKG  I personally interpreted any EKGs ordered by me or triage sinus tach rate 105 no acute ischemic changes, P-R 158 care is 88 ____________________________________________  RADIOLOGY  Pertinent labs & imaging results that were available during my care of the patient were reviewed by me and considered in my medical decision making (see chart for details). If possible, patient and/or family made aware of any abnormal findings. ____________________________________________    PROCEDURES  Procedure(s) performed: None  Procedures  Critical Care performed: None  ____________________________________________   INITIAL IMPRESSION / ASSESSMENT AND PLAN / ED COURSE  Pertinent labs & imaging results that were available during my care of the patient were reviewed by me and considered in my medical decision making (see chart for details).  patient with was likely pneumonia clinically, low suspicion for PE given cough and fever, we'll obtain chest x-ray blood work and reassess.      ____________________________________________   FINAL CLINICAL IMPRESSION(S) / ED DIAGNOSES  Final diagnoses:  None      This chart was dictated using voice recognition software.  Despite best efforts  to proofread,  errors can occur which can change meaning.      Schuyler Amor, MD 11/03/16 [phone]

## 2016-11-03 NOTE — ED Notes (Signed)
Transport to 148

## 2016-11-03 NOTE — ED Notes (Addendum)
Pt sick x 3 weeks cough, cold and fever, diarrhea that cleared up. Was in [place] 2 days and symptoms started. Got back last night. Flew into [place] then after 2 days was on a cruise back.

## 2016-11-03 NOTE — H&P (Signed)
[facility] [place] NAME: Carolyn Maniscalco    MR#:  [id number]  DATE OF BIRTH:  1940/10/06  DATE OF ADMISSION:  11/03/2016  PRIMARY CARE PHYSICIAN: Farley Ly, MD   REQUESTING/REFERRING PHYSICIAN: Dr. Charlotte Crumb  CHIEF COMPLAINT:   Chief Complaint  Patient presents with  . Shortness of Breath  . Weakness    HISTORY OF PRESENT ILLNESS:  Arav Bannister  is a 76 y.o. male with a known history of diabetes, hypertension, hyperlipidemia, GERD who presents to the hospital due to weakness, shortness of breath and cough. Patient just returned from outside the country last night. Patient was traveling in [place] over the past 3 weeks and said he fell ill 3 weeks ago which initially started with some diarrhea which has now resolved. Over the past week to 10 days he developed some cough which shortness of breath and weakness. Patient's cough is productive with some green-yellow sputum. He attempted to see her physician in [place] but was unsuccessful and returnd back home to [place] and was not feeling any better and therefore came to the ER for further evaluation. Patient's chest x-ray findings on admission were consistent with a pneumonia. Patient was noted to be tachycardic, no travel leukocytosis with the chest x-ray findings as mentioned above hospitalist services were contacted further treatment and evaluation.  PAST MEDICAL HISTORY:   Past Medical History:  Diagnosis Date  . Diabetes mellitus without complication ([place])   . GERD (gastroesophageal reflux disease)   . Hyperlipidemia   . Hypertension     PAST SURGICAL HISTORY:   Past Surgical History:  Procedure Laterality Date  . TONSILLECTOMY      SOCIAL HISTORY:   Social History  Substance Use Topics  . Smoking status: Former Smoker    Packs/day: 0.50    Years: 2.00    Types: Cigarettes  . Smokeless tobacco: Not on file  . Alcohol use No    FAMILY HISTORY:   Family History   Problem Relation Age of Onset  . Cerebral aneurysm Mother   . Heart disease Father   . COPD Father     DRUG ALLERGIES:  No Known Allergies  REVIEW OF SYSTEMS:   Review of Systems  Constitutional: Negative for fever and weight loss.  HENT: Negative for congestion, nosebleeds and tinnitus.   Eyes: Negative for blurred vision, double vision and redness.  Respiratory: Positive for cough, sputum production and shortness of breath. Negative for hemoptysis.   Cardiovascular: Negative for chest pain, orthopnea, leg swelling and PND.  Gastrointestinal: Negative for abdominal pain, diarrhea, melena, nausea and vomiting.  Genitourinary: Negative for dysuria, hematuria and urgency.  Musculoskeletal: Negative for falls and joint pain.  Neurological: Positive for weakness. Negative for dizziness, tingling, sensory change, focal weakness, seizures and headaches.  Endo/Heme/Allergies: Negative for polydipsia. Does not bruise/bleed easily.  Psychiatric/Behavioral: Negative for depression and memory loss. The patient is not nervous/anxious.     MEDICATIONS AT HOME:   Prior to Admission medications   Medication Sig Start Date End Date Taking? Authorizing Provider  amLODipine-benazepril (LOTREL) 5-40 MG capsule Take 1 capsule by mouth daily. 10/08/16  Yes [provider]  aspirin EC 81 MG tablet Take 81 mg by mouth daily.   Yes [provider]  atorvastatin (LIPITOR) 10 MG tablet Take 1 tablet by mouth daily. 10/24/14  Yes [provider]  Cholecalciferol 4000 units CAPS Take 1 capsule by mouth every 7 (seven) days.   Yes  [provider]  metFORMIN (GLUCOPHAGE) 500 MG tablet Take 500 mg by mouth 2 (two) times daily.   Yes [provider]  omeprazole (PRILOSEC) 40 MG capsule Take 1 capsule by mouth daily. 09/21/14  Yes [provider]      VITAL SIGNS:  Blood pressure 137/84, pulse (!) 116, temperature (!) 100.7 F (38.2 C), temperature source  Oral, resp. rate (!) 21, height 5\' 11"  (1.803 m), weight 79.4 kg (175 lb), SpO2 95 %.  PHYSICAL EXAMINATION:  Physical Exam  GENERAL:  76 y.o.-year-old patient lying in the bed in mild Resp. Distress.  EYES: Pupils equal, round, reactive to light and accommodation. No scleral icterus. Extraocular muscles intact.  HEENT: Head atraumatic, normocephalic. Oropharynx and nasopharynx clear. No oropharyngeal erythema, moist oral mucosa  NECK:  Supple, no jugular venous distention. No thyroid enlargement, no tenderness.  LUNGS: Good a/E b/l, Diffuse rhonchi b/l, No rales, wheezing. No use of accessory muscles of respiration.  CARDIOVASCULAR: S1, S2 RRR. No murmurs, rubs, gallops, clicks.  ABDOMEN: Soft, nontender, nondistended. Bowel sounds present. No organomegaly or mass.  EXTREMITIES: No pedal edema, cyanosis, or clubbing. + 2 pedal & radial pulses b/l.   NEUROLOGIC: Cranial nerves II through XII are intact. No focal Motor or sensory deficits appreciated b/l PSYCHIATRIC: The patient is alert and oriented x 3.  SKIN: No obvious rash, lesion, or ulcer.   LABORATORY PANEL:   CBC  Recent Labs Lab 11/03/16 1527  WBC 15.6*  HGB 12.3*  HCT 37.3*  PLT 279   ------------------------------------------------------------------------------------------------------------------  Chemistries   Recent Labs Lab 11/03/16 1527  NA 133*  K 3.5  CL 100*  CO2 22  GLUCOSE 122*  BUN 20  CREATININE 1.24  CALCIUM 8.4*  AST 42*  ALT 38  ALKPHOS 150*  BILITOT 0.7   ------------------------------------------------------------------------------------------------------------------  Cardiac Enzymes  Recent Labs Lab 11/03/16 1527  TROPONINI 0.05*   ------------------------------------------------------------------------------------------------------------------  RADIOLOGY:  Dg Chest 2 View  Result Date: 11/03/2016 CLINICAL DATA:  Cough and fever EXAM: CHEST  2 VIEW COMPARISON:  09/15/2015  FINDINGS: Chronic lung disease with prominent lung markings bilaterally. Patchy mild airspace disease in the left base is new and could represent atelectasis or pneumonia. There is apical pleural scarring bilaterally. Negative for heart failure or effusion. Negative for mass or adenopathy. No acute skeletal abnormality. IMPRESSION: Chronic lung disease. Left lower lobe airspace disease may represent pneumonia or atelectasis. Electronically Signed   By: Franchot Gallo M.D.   On: 11/03/2016 17:25     IMPRESSION AND PLAN:   76 year old male with past medical history of diabetes, hypertension, hyperlipidemia who presents to the hospital due to shortness of breath, cough, weakness and noted to have a pneumonia.  1. Sepsis-patient meets criteria given his low-grade fever, tachycardia and chest x-ray findings suggestive pneumonia. -We'll treat the patient with IV ceftriaxone and Zithromax for required pneumonia. Follow cultures.  2. Pneumonia-this is likely community-acquired pneumonia. We'll treat the patient with IV ceftriaxone, Zithromax. Follow sputum, blood cultures.  3. COPD-no acute exacerbation but patient still has some wheezing and bronchospasm. -I will place the patient on scheduled DuoNeb nebs, Pulmicort nebs. Continue antibiotic for pneumonia as mentioned. Continue antitussives for the cough. Follow clinically.  4. Essential hypertension-continue Norvasc, benazepril.  5. Hyperlipidemia-continue atorvastatin.  6. Diabetes type 2 without complication-continue metformin.  7. GERD-continue Protonix.    All the records are reviewed and case discussed with ED provider. Management plans discussed with the patient, family and they are in agreement.  CODE STATUS: Full code  TOTAL TIME TAKING CARE OF THIS PATIENT: 40 minutes.    Henreitta Leber M.D on 11/03/2016 at 6:17 PM  Between 7am to 6pm - Pager - [phone]  After 6pm go to [email or web address] - password [email or web address] [facility]  [facility]  Office  [phone]  CC: Primary care physician; Farley Ly, MD

## 2016-11-03 NOTE — ED Triage Notes (Signed)
Pt reports having been in [place] for the past 3 weeks, returning home last night, pt states that he got sick within 2 days of getting there and has continued to have cough, congestion, fever, sob, and weakness

## 2016-11-04 LAB — PROTIME-INR
INR: 1.19
PROTHROMBIN TIME: 15 s (ref 11.4–15.2)

## 2016-11-04 LAB — MAGNESIUM: Magnesium: 1.2 mg/dL — ABNORMAL LOW (ref 1.7–2.4)

## 2016-11-04 LAB — CBC
HCT: 32.3 % — ABNORMAL LOW (ref 40.0–52.0)
Hemoglobin: 10.9 g/dL — ABNORMAL LOW (ref 13.0–18.0)
MCH: 27.2 pg (ref 26.0–34.0)
MCHC: 33.8 g/dL (ref 32.0–36.0)
MCV: 80.3 fL (ref 80.0–100.0)
PLATELETS: 231 10*3/uL (ref 150–440)
RBC: 4.03 MIL/uL — AB (ref 4.40–5.90)
RDW: 16.2 % — ABNORMAL HIGH (ref 11.5–14.5)
WBC: 12.5 10*3/uL — ABNORMAL HIGH (ref 3.8–10.6)

## 2016-11-04 LAB — BASIC METABOLIC PANEL
Anion gap: 5 (ref 5–15)
BUN: 12 mg/dL (ref 6–20)
CALCIUM: 8 mg/dL — AB (ref 8.9–10.3)
CO2: 23 mmol/L (ref 22–32)
Chloride: 106 mmol/L (ref 101–111)
Creatinine, Ser: 0.93 mg/dL (ref 0.61–1.24)
GFR calc non Af Amer: >60 mL/min (ref >60)
GLUCOSE: 163 mg/dL — AB (ref 65–99)
Potassium: 2.8 mmol/L — ABNORMAL LOW (ref 3.5–5.1)
Sodium: 134 mmol/L — ABNORMAL LOW (ref 135–145)

## 2016-11-04 LAB — APTT: APTT: 43 s — AB (ref 24–36)

## 2016-11-04 LAB — GLUCOSE, CAPILLARY
GLUCOSE-CAPILLARY: 123 mg/dL — AB (ref 65–99)
Glucose-Capillary: 139 mg/dL — ABNORMAL HIGH (ref 65–99)

## 2016-11-04 LAB — TROPONIN I
TROPONIN I: 0.04 ng/mL — AB (ref <0.03)
Troponin I: 0.05 ng/mL (ref <0.03)

## 2016-11-04 MED ORDER — INSULIN ASPART 100 UNIT/ML ~~LOC~~ SOLN: 0.0000 [IU] | Freq: Every day | SUBCUTANEOUS | Status: DC

## 2016-11-04 MED ORDER — HEPARIN (PORCINE) IN NACL 100-0.45 UNIT/ML-% IJ SOLN
1100.0000 [IU]/h | INTRAMUSCULAR | Status: DC
  Administered 2016-11-05: 950 [IU]/h via INTRAVENOUS
  Filled 2016-11-04: qty 250

## 2016-11-04 MED ORDER — HEPARIN BOLUS VIA INFUSION
4000.0000 [IU] | Freq: Once | INTRAVENOUS | Status: DC
  Filled 2016-11-04: qty 4000

## 2016-11-04 MED ORDER — METOPROLOL TARTRATE 5 MG/5ML IV SOLN
5.0000 mg | Freq: Once | INTRAVENOUS | Status: AC
  Administered 2016-11-04: 5 mg via INTRAVENOUS
  Filled 2016-11-04: qty 5

## 2016-11-04 MED ORDER — LORATADINE 10 MG PO TABS
10.0000 mg | ORAL_TABLET | Freq: Every day | ORAL | Status: DC
  Administered 2016-11-05 – 2016-11-07 (×3): 10 mg via ORAL
  Filled 2016-11-04 (×3): qty 1

## 2016-11-04 MED ORDER — SODIUM CHLORIDE 0.9 % IV SOLN
INTRAVENOUS | Status: DC
  Administered 2016-11-05: via INTRAVENOUS

## 2016-11-04 MED ORDER — MAGNESIUM SULFATE 4 GM/100ML IV SOLN
4.0000 g | Freq: Once | INTRAVENOUS | Status: AC
  Administered 2016-11-04: 4 g via INTRAVENOUS
  Filled 2016-11-04: qty 100

## 2016-11-04 MED ORDER — DILTIAZEM HCL 100 MG IV SOLR
5.0000 mg/h | INTRAVENOUS | Status: DC
  Administered 2016-11-05: 5 mg/h via INTRAVENOUS
  Filled 2016-11-04: qty 100

## 2016-11-04 MED ORDER — METOPROLOL TARTRATE 25 MG PO TABS: 12.5000 mg | ORAL_TABLET | Freq: Two times a day (BID) | ORAL | Status: DC

## 2016-11-04 MED ORDER — INSULIN ASPART 100 UNIT/ML ~~LOC~~ SOLN
0.0000 [IU] | Freq: Three times a day (TID) | SUBCUTANEOUS | Status: DC
  Administered 2016-11-04 – 2016-11-06 (×4): 1 [IU] via SUBCUTANEOUS
  Filled 2016-11-04 (×4): qty 1

## 2016-11-04 MED ORDER — POTASSIUM CHLORIDE CRYS ER 20 MEQ PO TBCR
40.0000 meq | EXTENDED_RELEASE_TABLET | ORAL | Status: AC
  Administered 2016-11-04 (×2): 40 meq via ORAL
  Filled 2016-11-04 (×2): qty 2

## 2016-11-04 MED ORDER — MAGNESIUM SULFATE 2 GM/50ML IV SOLN: 2.0000 g | Freq: Once | INTRAVENOUS | Status: DC

## 2016-11-04 MED ORDER — METOPROLOL TARTRATE 5 MG/5ML IV SOLN: 2.5000 mg | Freq: Once | INTRAVENOUS | Status: DC

## 2016-11-04 NOTE — Progress Notes (Signed)
Spoke with Dr. Marcille Blanco pt with potassium of 2.8 and troponin back up to 0.05. Md to place orders.

## 2016-11-04 NOTE — Progress Notes (Addendum)
Patient heart rate went to 170s.  Monitor showed a fib.  Patient's rate was fluctuating significantly, and EKG did not show a fib, though HR had fallen back to 112 when EKG was done.  Patient has no prior diagnosis of a fib.  5 mg IV lopressor ordered, echo and cardiology orders placed.  Update: multiple doses lopressor have not provided adequate rate control.  Repeat EKG shows a fib RVR.  Will transfer patient to cardiac floor and start cardizem gtt.  Jacqulyn Bath [facility] 11/04/2016, 9:38 PM

## 2016-11-04 NOTE — Progress Notes (Addendum)
ANTICOAGULATION CONSULT NOTE - Initial Consult  Pharmacy Consult for heparin Indication: atrial fibrillation  No Known Allergies  Patient Measurements: Height: 5\' 11"  (180.3 cm) Weight: 175 lb (79.4 kg) IBW/kg (Calculated) : 75.3 Heparin Dosing Weight: 80 kg  Vital Signs: Temp: 99.3 F (37.4 C) (10/22 1917) Temp Source: Oral (10/22 1917) BP: 115/66 (10/22 2241) Pulse Rate: 105 (10/22 2241)  Labs:  Recent Labs  11/03/16 1527 11/03/16 2000 11/04/16 0015 11/04/16 0353  HGB 12.3*  --   --  10.9*  HCT 37.3*  --   --  32.3*  PLT 279  --   --  231  CREATININE 1.24  --   --  0.93  TROPONINI 0.05* 0.05* 0.04* 0.05*    Estimated Creatinine Clearance: 72 mL/min (by C-G formula based on SCr of 0.93 mg/dL).   Medical History: Past Medical History:  Diagnosis Date  . Diabetes mellitus without complication ([place])   . GERD (gastroesophageal reflux disease)   . Hyperlipidemia   . Hypertension     Medications:  Scheduled:  . amLODipine  5 mg Oral QPM   And  . benazepril  40 mg Oral QPM  . aspirin EC  81 mg Oral Daily  . atorvastatin  10 mg Oral QPM  . azithromycin  250 mg Oral Daily  . budesonide (PULMICORT) nebulizer solution  0.5 mg Nebulization BID  . cholecalciferol  4,000 Units Oral Q7 days  . dextromethorphan  30 mg Oral BID   And  . guaiFENesin  600 mg Oral BID  . enoxaparin (LOVENOX) injection  40 mg Subcutaneous Q24H  . heparin  4,000 Units Intravenous Once  . insulin aspart  0-5 Units Subcutaneous QHS  . insulin aspart  0-9 Units Subcutaneous TID WC  . ipratropium-albuterol  3 mL Nebulization Q6H  . loratadine  10 mg Oral Daily  . metFORMIN  500 mg Oral BID WC  . pantoprazole  40 mg Oral Daily    Assessment: Patient admitted w/ SOB found to be in afib w/ RVR w/ rate of 146 bpm on EKG. Patient is being started heparin drip. Patient was on VTE prophylaxis w/ lovenox 40 mg subq daily w/ last dose on 1022 @ 2046.  Goal of Therapy:  Heparin level 0.3-0.7  units/ml Monitor platelets by anticoagulation protocol: Yes   Plan:  Considering patient received two prior doses of lovenox will not bolus w/ heparin. Will start drip @ 950 units/hr  Will check HL/aPTT since patient received lovenox as well 1023 @ 0700. Baseline labs ordered and will dose off of aPTT if HL/aPTT not correlating.  Tobie Lords, PharmD, BCPS Clinical Pharmacist 11/04/2016

## 2016-11-04 NOTE — Progress Notes (Signed)
Patient HR 170; asymptomatic. MD notified. Orders received for sat EKG, and cardiology consult

## 2016-11-04 NOTE — Progress Notes (Signed)
Pt remaining alert and oriented. Pt remaining on room air. No complaints of pain during the night. Voiding without difficulty. Pt able to sleep in between care.

## 2016-11-04 NOTE — Progress Notes (Signed)
[facility] [place] NAME: Gabriel Brewer    MR#:  [id number]  DATE OF BIRTH:  12-Jul-1940  SUBJECTIVE:  CHIEF COMPLAINT:   Chief Complaint  Patient presents with  . Shortness of Breath  . Weakness   - feels much better today. Off oxygen -Still has cough and shortness of breath  REVIEW OF SYSTEMS:  Review of Systems  Constitutional: Positive for malaise/fatigue. Negative for chills and fever.  HENT: Positive for congestion. Negative for ear discharge, hearing loss and nosebleeds.   Eyes: Negative for blurred vision and double vision.  Respiratory: Positive for cough and shortness of breath. Negative for wheezing.   Cardiovascular: Negative for chest pain, palpitations and leg swelling.  Gastrointestinal: Negative for abdominal pain, constipation, diarrhea, nausea and vomiting.  Genitourinary: Negative for dysuria.  Musculoskeletal: Negative for myalgias.  Neurological: Negative for dizziness, sensory change, speech change, focal weakness, seizures and headaches.  Psychiatric/Behavioral: Negative for depression.    DRUG ALLERGIES:  No Known Allergies  VITALS:  Blood pressure 129/60, pulse (!) 116, temperature 98.4 F (36.9 C), temperature source Oral, resp. rate (!) 22, height 5\' 11"  (1.803 m), weight 79.4 kg (175 lb), SpO2 92 %.  PHYSICAL EXAMINATION:  Physical Exam  GENERAL:  76 y.o.-year-old patient lying in the bed with no acute distress.  EYES: Pupils equal, round, reactive to light and accommodation. No scleral icterus. Extraocular muscles intact.  HEENT: Head atraumatic, normocephalic. Oropharynx and nasopharynx clear.  NECK:  Supple, no jugular venous distention. No thyroid enlargement, no tenderness.  LUNGS: Normal breath sounds bilaterally, scattered expiratory wheezing with rhonchi at the bases.no wheezing, rales or crepitation. No use of accessory muscles of respiration.  CARDIOVASCULAR: S1, S2 normal. No murmurs, rubs,  or gallops.  ABDOMEN: Soft, nontender, nondistended. Bowel sounds present. No organomegaly or mass.  EXTREMITIES: No pedal edema, cyanosis, or clubbing.  NEUROLOGIC: Cranial nerves II through XII are intact. Muscle strength 5/5 in all extremities. Sensation intact. Gait not checked. Global weakness noted. PSYCHIATRIC: The patient is alert and oriented x 3.  SKIN: No obvious rash, lesion, or ulcer.    LABORATORY PANEL:   CBC  Recent Labs Lab 11/04/16 0353  WBC 12.5*  HGB 10.9*  HCT 32.3*  PLT 231   ------------------------------------------------------------------------------------------------------------------  Chemistries   Recent Labs Lab 11/03/16 1527 11/04/16 0353  NA 133* 134*  K 3.5 2.8*  CL 100* 106  CO2 22 23  GLUCOSE 122* 163*  BUN 20 12  CREATININE 1.24 0.93  CALCIUM 8.4* 8.0*  MG  --  1.2*  AST 42*  --   ALT 38  --   ALKPHOS 150*  --   BILITOT 0.7  --    ------------------------------------------------------------------------------------------------------------------  Cardiac Enzymes  Recent Labs Lab 11/04/16 0353  TROPONINI 0.05*   ------------------------------------------------------------------------------------------------------------------  RADIOLOGY:  Dg Chest 2 View  Result Date: 11/03/2016 CLINICAL DATA:  Cough and fever EXAM: CHEST  2 VIEW COMPARISON:  09/15/2015 FINDINGS: Chronic lung disease with prominent lung markings bilaterally. Patchy mild airspace disease in the left base is new and could represent atelectasis or pneumonia. There is apical pleural scarring bilaterally. Negative for heart failure or effusion. Negative for mass or adenopathy. No acute skeletal abnormality. IMPRESSION: Chronic lung disease. Left lower lobe airspace disease may represent pneumonia or atelectasis. Electronically Signed   By: Franchot Gallo M.D.   On: 11/03/2016 17:25    EKG:   Orders placed or performed during the hospital encounter of 11/03/16  .  ED EKG  . ED EKG    ASSESSMENT AND PLAN:   76 year old male with past medical history of diabetes, hypertension, hyperlipidemia who presents to the hospital due to shortness of breath, cough, weakness and noted to have a pneumonia.  #1 Sepsis- secondary to community acquired pneumonia -blood cultures pending -on rocephin and azithromycin  #2 COPD-stable, no indication for systemic steroids -Continue nebulizer treatments and cough medications. -Added Claritin for congestion  #3 hypertension-on Norvasc and benazepril  #4 hypokalemia and hypomagnesemia-being replaced aggressively  #5 diabetes mellitus-on metformin, add sliding scale insulin  #6 DVT Prophylaxis- lovenox    All the records are reviewed and case discussed with Care Management/Social Workerr. Management plans discussed with the patient, family and they are in agreement.  CODE STATUS: Full Code  TOTAL TIME TAKING CARE OF THIS PATIENT: 37 minutes.   POSSIBLE D/C IN 1-2 DAYS, DEPENDING ON CLINICAL CONDITION.   Gladstone Lighter M.D on 11/04/2016 at 1:52 PM  Between 7am to 6pm - Pager - [phone]  After 6pm go to [email or web address] - password [email or web address] [facility]  Office  [phone]  CC: Primary care physician; Farley Ly, MD

## 2016-11-04 NOTE — Progress Notes (Signed)
Patient HR is still up in the 130's going up to the 160's. Dr called multiple times and new orders given. Patient will be transferred to a telemetry floor when a bed is ready. Will continue to monitor.

## 2016-11-05 ENCOUNTER — Inpatient Hospital Stay (HOSPITAL_COMMUNITY)
Admit: 2016-11-05 | Discharge: 2016-11-05 | Disposition: A | Discharge status: Home / Self Care | Payer: Medicare Other | Attending: Internal Medicine | Admitting: Internal Medicine

## 2016-11-05 ENCOUNTER — Inpatient Hospital Stay: Payer: Medicare Other

## 2016-11-05 DIAGNOSIS — I4891 Unspecified atrial fibrillation: Secondary | ICD-10-CM

## 2016-11-05 DIAGNOSIS — J181 Lobar pneumonia, unspecified organism: Secondary | ICD-10-CM

## 2016-11-05 DIAGNOSIS — I340 Nonrheumatic mitral (valve) insufficiency: Secondary | ICD-10-CM

## 2016-11-05 DIAGNOSIS — I10 Essential (primary) hypertension: Secondary | ICD-10-CM

## 2016-11-05 LAB — BASIC METABOLIC PANEL
Anion gap: 8 (ref 5–15)
BUN: 13 mg/dL (ref 6–20)
CHLORIDE: 103 mmol/L (ref 101–111)
CO2: 24 mmol/L (ref 22–32)
CREATININE: 0.99 mg/dL (ref 0.61–1.24)
Calcium: 8.3 mg/dL — ABNORMAL LOW (ref 8.9–10.3)
GFR calc non Af Amer: >60 mL/min (ref >60)
Glucose, Bld: 125 mg/dL — ABNORMAL HIGH (ref 65–99)
POTASSIUM: 3.6 mmol/L (ref 3.5–5.1)
Sodium: 135 mmol/L (ref 135–145)

## 2016-11-05 LAB — CBC
HEMATOCRIT: 35 % — AB (ref 40.0–52.0)
Hemoglobin: 11.9 g/dL — ABNORMAL LOW (ref 13.0–18.0)
MCH: 27 pg (ref 26.0–34.0)
MCHC: 34.1 g/dL (ref 32.0–36.0)
MCV: 79.4 fL — AB (ref 80.0–100.0)
PLATELETS: 301 10*3/uL (ref 150–440)
RBC: 4.41 MIL/uL (ref 4.40–5.90)
RDW: 16.1 % — AB (ref 11.5–14.5)
WBC: 10.9 10*3/uL — AB (ref 3.8–10.6)

## 2016-11-05 LAB — GLUCOSE, CAPILLARY
GLUCOSE-CAPILLARY: 131 mg/dL — AB (ref 65–99)
GLUCOSE-CAPILLARY: 109 mg/dL — AB (ref 65–99)
GLUCOSE-CAPILLARY: 140 mg/dL — AB (ref 65–99)
Glucose-Capillary: 126 mg/dL — ABNORMAL HIGH (ref 65–99)

## 2016-11-05 LAB — HEMOGLOBIN A1C
Hgb A1c MFr Bld: 5.8 % — ABNORMAL HIGH (ref 4.8–5.6)
Mean Plasma Glucose: 120 mg/dL

## 2016-11-05 LAB — TSH: TSH: 1.319 u[IU]/mL (ref 0.350–4.500)

## 2016-11-05 LAB — MAGNESIUM: MAGNESIUM: 2 mg/dL (ref 1.7–2.4)

## 2016-11-05 LAB — HEPARIN LEVEL (UNFRACTIONATED)
HEPARIN UNFRACTIONATED: 0.11 [IU]/mL — AB (ref 0.30–0.70)
Heparin Unfractionated: 0.22 IU/mL — ABNORMAL LOW (ref 0.30–0.70)

## 2016-11-05 MED ORDER — HEPARIN BOLUS VIA INFUSION
2200.0000 [IU] | Freq: Once | INTRAVENOUS | Status: AC
  Administered 2016-11-05: 2200 [IU] via INTRAVENOUS
  Filled 2016-11-05: qty 2200

## 2016-11-05 MED ORDER — FUROSEMIDE 10 MG/ML IJ SOLN
20.0000 mg | Freq: Two times a day (BID) | INTRAMUSCULAR | Status: DC
  Administered 2016-11-05 – 2016-11-07 (×5): 20 mg via INTRAVENOUS
  Filled 2016-11-05 (×5): qty 2

## 2016-11-05 MED ORDER — SODIUM CHLORIDE 0.9% FLUSH
3.0000 mL | Freq: Two times a day (BID) | INTRAVENOUS | Status: DC
  Administered 2016-11-05 – 2016-11-07 (×4): 3 mL via INTRAVENOUS

## 2016-11-05 MED ORDER — POTASSIUM CHLORIDE CRYS ER 20 MEQ PO TBCR
40.0000 meq | EXTENDED_RELEASE_TABLET | Freq: Once | ORAL | Status: AC
  Administered 2016-11-05: 40 meq via ORAL
  Filled 2016-11-05: qty 2

## 2016-11-05 MED ORDER — POTASSIUM CHLORIDE CRYS ER 20 MEQ PO TBCR
40.0000 meq | EXTENDED_RELEASE_TABLET | Freq: Two times a day (BID) | ORAL | Status: AC
  Administered 2016-11-05 (×2): 40 meq via ORAL
  Filled 2016-11-05 (×2): qty 2

## 2016-11-05 MED ORDER — HEPARIN (PORCINE) IN NACL 100-0.45 UNIT/ML-% IJ SOLN
1350.0000 [IU]/h | INTRAMUSCULAR | Status: DC
  Administered 2016-11-05 (×2): 1250 [IU]/h via INTRAVENOUS
  Filled 2016-11-05: qty 250

## 2016-11-05 MED ORDER — DILTIAZEM HCL ER COATED BEADS 120 MG PO CP24
120.0000 mg | ORAL_CAPSULE | Freq: Every day | ORAL | Status: DC
  Administered 2016-11-05 – 2016-11-06 (×2): 120 mg via ORAL
  Filled 2016-11-05 (×2): qty 1

## 2016-11-05 MED ORDER — IOPAMIDOL (ISOVUE-370) INJECTION 76%
75.0000 mL | Freq: Once | INTRAVENOUS | Status: AC | PRN
  Administered 2016-11-05: 75 mL via INTRAVENOUS

## 2016-11-05 NOTE — Progress Notes (Signed)
Pt starting to have more crackles throughout lung fields, and sounding wet. Pt has normal saline infusing @ 156ml/hr that I started at midnight, MD paged. Dr. Marcille Blanco gave orders to stop fluids. Pt is still on cardizem gtt @ 5mg , pt has converted back to sinus rhythm with heart rate currently 91. Also heparin gtt infusing @ 9.5. VSS. Will continue to monitor.

## 2016-11-05 NOTE — Progress Notes (Signed)
ANTICOAGULATION CONSULT NOTE   Pharmacy Consult for heparin Indication: atrial fibrillation  No Known Allergies  Patient Measurements: Height: 5\' 11"  (180.3 cm) Weight: 166 lb 4.8 oz (75.4 kg) IBW/kg (Calculated) : 75.3 Heparin Dosing Weight: 75.4 kg  Vital Signs: Temp: 98.5 F (36.9 C) (10/23 1946) Temp Source: Oral (10/23 1946) BP: 130/65 (10/23 1946) Pulse Rate: 80 (10/23 1946)  Labs:  Recent Labs  11/03/16 1527 11/03/16 2000 11/04/16 0015 11/04/16 0353 11/04/16 2312 11/05/16 0723 11/05/16 1730 11/05/16 1920  HGB 12.3*  --   --  10.9*  --   --   --  11.9*  HCT 37.3*  --   --  32.3*  --   --   --  35.0*  PLT 279  --   --  231  --   --   --  301  APTT  --   --   --   --  43*  --   --   --   LABPROT  --   --   --   --  15.0  --   --   --   INR  --   --   --   --  1.19  --   --   --   HEPARINUNFRC  --   --   --   --   --  0.22* 0.11*  --   CREATININE 1.24  --   --  0.93  --  0.99  --   --   TROPONINI 0.05* 0.05* 0.04* 0.05*  --   --   --   --     Estimated Creatinine Clearance: 67.6 mL/min (by C-G formula based on SCr of 0.99 mg/dL).   Assessment: Patient admitted w/ SOB found to be in afib w/ RVR w/ rate of 146 bpm on EKG.  Goal of Therapy:  Heparin level 0.3-0.7 units/ml Monitor platelets by anticoagulation protocol: Yes   Plan:  HL = 0.11 is subtherapeutic on infusion of 1100 units/hr. Spoke with RN - no interruptions in infusion or issues with IV access.  Ordered heparin bolus of 2200 units and increased heparin infusion to 1250 units/hr. Will check HL in 6 hours and CBC with AM labs tomorrow.  Pharmacy will continue to follow.  Lenis Noon, PharmD Clinical Pharmacist 11/05/2016 10:26 PM

## 2016-11-05 NOTE — Progress Notes (Addendum)
ANTICOAGULATION CONSULT NOTE   Pharmacy Consult for heparin Indication: atrial fibrillation  No Known Allergies  Patient Measurements: Height: 5\' 11"  (180.3 cm) Weight: 166 lb 4.8 oz (75.4 kg) IBW/kg (Calculated) : 75.3 Heparin Dosing Weight: 75.4 kg  Vital Signs: Temp: 99.1 F (37.3 C) (10/23 0447) Temp Source: Oral (10/23 0447) BP: 131/64 (10/23 0447) Pulse Rate: 82 (10/23 0447)  Labs:  Recent Labs  11/03/16 1527 11/03/16 2000 11/04/16 0015 11/04/16 0353 11/04/16 2312 11/05/16 0723  HGB 12.3*  --   --  10.9*  --   --   HCT 37.3*  --   --  32.3*  --   --   PLT 279  --   --  231  --   --   APTT  --   --   --   --  43*  --   LABPROT  --   --   --   --  15.0  --   INR  --   --   --   --  1.19  --   HEPARINUNFRC  --   --   --   --   --  0.22*  CREATININE 1.24  --   --  0.93  --  0.99  TROPONINI 0.05* 0.05* 0.04* 0.05*  --   --     Estimated Creatinine Clearance: 67.6 mL/min (by C-G formula based on SCr of 0.99 mg/dL).   Assessment: Patient admitted w/ SOB found to be in afib w/ RVR w/ rate of 146 bpm on EKG. Patient is being started heparin drip. Patient was on VTE prophylaxis w/ lovenox 40 mg subq daily w/ last dose on 1022 @ 2046.  Goal of Therapy:  Heparin level 0.3-0.7 units/ml Monitor platelets by anticoagulation protocol: Yes   Plan:  Considering patient received two prior doses of lovenox will not bolus w/ heparin. Will start drip @ 950 units/hr  Will check HL/aPTT since patient received lovenox as well 1023 @ 0700. Baseline labs ordered and will dose off of aPTT if HL/aPTT not correlating.  10/23 at 0723- first heparin level subtherapeutic at 0.22. Heparin drip started at 10/23 at [place].  No order for CBC therefore CBC was not drawn this AM with labs. Called lab, no extra tube drawn, cannot add on CBC. Will order CBC with next level to avoid sticking pt numerous times. Per RN, no s/sx of bleeding noted. RN confirms heparin drip running at 9.5 ml/hr.   Will increase heparin drip to 1100 unit/hr (=11 ml/hr) Heparin level and CBC in 8h, CBC in AM  Pharmacy will continue to follow.  Rayna Sexton, PharmD, BCPS Clinical Pharmacist 11/05/2016 8:53 AM

## 2016-11-05 NOTE — Consult Note (Signed)
Cardiology Consultation:   Patient ID: Gabriel Brewer; [id number]; October 10, 1940   Admit date: 11/03/2016 Date of Consult: 11/05/2016  Primary Care Provider: Farley Ly, MD Primary Cardiologist: Cephus Slater   Patient Profile:   Gabriel Brewer is a 76 y.o. male with a hx of non-rheumatic mitral regurgitation with history of mitral valve bacterial endocarditis in 03/2015 in the setting of MRSA bacteremia/sepsis, diastolic dysfunction, priro stroke involving the right eye x 2, COPD, DM2, HTN, and HLD who is being seen today for the evaluation of new onset Afib with RVR at the request of Dr. Jannifer Franklin, MD.  History of Present Illness:   Gabriel Brewer is followed at [facility]. Admitted to hospital in [place] in 03/2015 with MRSA bacteremia/sepsis. Found to have mitral valve vegetation. Treated with ABX. Most recent echo from 07/2016 showed an EF of 95-62%, diastolic dysfunction, mild mitral regurgitation with an eccentric jet and mild tricuspid regurgitation. No prior ischemia workups.   He recently went on vacation to [place] in mid September, 2018 for 3 weeks. While in [place] he went on a week-long cruise. While he was on the cruise, he noted URI symptoms consisting of nasal congestion, rhinorrhea, watery eyes, and sneezing. He was seen by the ship's onboard doctor who advised OTC therapy. While in the airport in [place], [place] he noticed his was more SOB than his baseline SOB. No associated chest pain, hemoptysis, or LE swelling. He returned via flight on 11/03/2016. Upon arrival to the [place] he presented to [facility] ED on 10/21 with persistent weakness and SOB. He was felt to have CAP and treated accordingly. Overnight into 10/23 the patient developed new onset Afib with RVR with heart rates in the 170s bpm. He noted mildly increased SOB and palpitations. He was started on heparin gtt and IV Cardizem. He converted to sinus rhythm around midnight, 10/23 and has remained in sinus rhythm since. Troponin has been  minimally elevated and flat trending with a peak of 0.05. BNP normal at 70. He was noted to be hypokalemic at 2.8 which has improved to 3.6 currently. His magnesium was also low at 1.2 which too has been repleted to 2.0. TSH was normal. He was noted to have a leukocytosis of 15.6 that has improved to 12.5 currently. Given his SOB and travel history, cardiology ordered CTA chest to evaluate for PE, which was negative, though did show PNA and small pleural effusions. Never with chest pain. He remains on supplemental oxygen at 4 L via nasal cannula.    Past Medical History:  Diagnosis Date  . Cancer ([place]) 2017   skin   . Diabetes mellitus without complication ([place])   . GERD (gastroesophageal reflux disease)   . Hyperlipidemia   . Hypertension     Past Surgical History:  Procedure Laterality Date  . TONSILLECTOMY       Home Meds: Prior to Admission medications   Medication Sig Start Date End Date Taking? Authorizing Provider  amLODipine-benazepril (LOTREL) 5-40 MG capsule Take 1 capsule by mouth daily. 10/08/16  Yes [provider]  aspirin EC 81 MG tablet Take 81 mg by mouth daily.   Yes [provider]  atorvastatin (LIPITOR) 10 MG tablet Take 1 tablet by mouth daily. 10/24/14  Yes [provider]  Cholecalciferol 4000 units CAPS Take 1 capsule by mouth every 7 (seven) days.   Yes [provider]  metFORMIN (GLUCOPHAGE) 500 MG tablet Take 500 mg by mouth 2 (two) times daily.   Yes  [provider]  omeprazole (PRILOSEC) 40 MG capsule Take 1 capsule by mouth daily. 09/21/14  Yes [provider]    Inpatient Medications: Scheduled Meds: . aspirin EC  81 mg Oral Daily  . atorvastatin  10 mg Oral QPM  . azithromycin  250 mg Oral Daily  . benazepril  40 mg Oral QPM  . budesonide (PULMICORT) nebulizer solution  0.5 mg Nebulization BID  . cholecalciferol  4,000 Units Oral Q7 days  . dextromethorphan  30 mg Oral BID   And  . guaiFENesin   600 mg Oral BID  . diltiazem  120 mg Oral Daily  . insulin aspart  0-5 Units Subcutaneous QHS  . insulin aspart  0-9 Units Subcutaneous TID WC  . ipratropium-albuterol  3 mL Nebulization Q6H  . loratadine  10 mg Oral Daily  . metFORMIN  500 mg Oral BID WC  . pantoprazole  40 mg Oral Daily   Continuous Infusions: . cefTRIAXone (ROCEPHIN)  IV Stopped (11/04/16 2001)  . heparin 1,100 Units/hr (11/05/16 0908)  . magnesium sulfate 1 - 4 g bolus IVPB     PRN Meds: acetaminophen **OR** acetaminophen, guaiFENesin, ondansetron **OR** ondansetron (ZOFRAN) IV  Allergies:  No Known Allergies  Social History:   Social History   Social History  . Marital status: Married    Spouse name: N/A  . Number of children: N/A  . Years of education: N/A   Occupational History  . Not on file.   Social History Main Topics  . Smoking status: Former Smoker    Packs/day: 0.50    Years: 2.00    Types: Cigarettes    Quit date: 11/05/2010  . Smokeless tobacco: Never Used  . Alcohol use No  . Drug use: No  . Sexual activity: Yes   Other Topics Concern  . Not on file   Social History Narrative  . No narrative on file     Family History:  Family History  Problem Relation Age of Onset  . Cerebral aneurysm Mother   . Heart disease Father   . COPD Father     ROS:  Review of Systems  Constitutional: Positive for malaise/fatigue. Negative for chills, diaphoresis, fever and weight loss.  HENT: Positive for congestion.   Eyes: Negative for discharge and redness.  Respiratory: Positive for shortness of breath. Negative for cough, hemoptysis, sputum production and wheezing.   Cardiovascular: Positive for palpitations. Negative for chest pain, orthopnea, claudication, leg swelling and PND.  Gastrointestinal: Negative for abdominal pain, blood in stool, heartburn, melena, nausea and vomiting.  Genitourinary: Negative for hematuria.  Musculoskeletal: Negative for falls and myalgias.  Skin:  Negative for rash.  Neurological: Positive for weakness. Negative for dizziness, tingling, tremors, sensory change, speech change, focal weakness and loss of consciousness.  Endo/Heme/Allergies: Does not bruise/bleed easily.  Psychiatric/Behavioral: Negative for substance abuse. The patient is not nervous/anxious.   All other systems reviewed and are negative.     Physical Exam/Data:   Vitals:   11/05/16 0155 11/05/16 0201 11/05/16 0447 11/05/16 0745  BP: (!) 113/55 130/65 131/64 (!) 144/65  Pulse: 97 93 82 83  Resp:   18 20  Temp:   99.1 F (37.3 C)   TempSrc:   Oral   SpO2: 90% 90% 90% 92%  Weight:      Height:        Intake/Output Summary (Last 24 hours) at 11/05/16 1054 Last data filed at 11/05/16 1024  Gross per 24 hour  Intake  1014.03 ml  Output             1050 ml  Net           -35.97 ml   Filed Weights   11/03/16 1513 11/05/16 0006  Weight: 175 lb (79.4 kg) 166 lb 4.8 oz (75.4 kg)   Body mass index is 23.19 kg/m.   Physical Exam: General: Well developed, well nourished, in no acute distress. Head: Normocephalic, atraumatic, sclera non-icteric, no xanthomas, nares without discharge.  Neck: Negative for carotid bruits. JVD not elevated. Lungs: Diminished breath sounds along bilateral bases. Breathing is unlabored. Remains on 4 L via nasal cannula.  Heart: RRR with S1 S2. No murmurs, rubs, or gallops appreciated. Abdomen: Soft, non-tender, non-distended with normoactive bowel sounds. No hepatomegaly. No rebound/guarding. No obvious abdominal masses. Msk:  Strength and tone appear normal for age. Extremities: No clubbing or cyanosis. No edema. Distal pedal pulses are 2+ and equal bilaterally. Neuro: Alert and oriented X 3. No facial asymmetry. No focal deficit. Moves all extremities spontaneously. Psych:  Responds to questions appropriately with a normal affect.   EKG:  The EKG was personally reviewed and demonstrates: 10/22 - 21:26, sinus tachycardia,  112 bpm, PACs, no acute st/t changes. 10/22 - 21:28, sinus tachycardia, 107 bpm, PACs, no acute st/t changes. 10/22 - 22:18, Afib with RVR, 146 bpm, no acute st/t changes. Telemetry:  Telemetry was personally reviewed and demonstrates: Afib with RVR with heart rates into the 170s bpm. Converted to NSR around midnight and has remained in NSR since  Weights: Filed Weights   11/03/16 1513 11/05/16 0006  Weight: 175 lb (79.4 kg) 166 lb 4.8 oz (75.4 kg)    Relevant CV Studies: TTE 08/05/2016: IMPRESSIONS Normal left ventricular ejection fraction estimated at 60-65%. Abnormal relaxation filling pattern of the left ventricle for age (stage 1 diastolic dysfunction). Mild mitral regurgitation with eccentric jet. Mild tricuspid regurgitation.  Laboratory Data:  Chemistry Recent Labs Lab 11/03/16 1527 11/04/16 0353 11/05/16 0723  NA 133* 134* 135  K 3.5 2.8* 3.6  CL 100* 106 103  CO2 22 23 24   GLUCOSE 122* 163* 125*  BUN 20 12 13   CREATININE 1.24 0.93 0.99  CALCIUM 8.4* 8.0* 8.3*  GFRNONAA 55* >60 >60  GFRAA >60 >60 >60  ANIONGAP 11 5 8      Recent Labs Lab 11/03/16 1527  PROT 7.1  ALBUMIN 3.2*  AST 42*  ALT 38  ALKPHOS 150*  BILITOT 0.7   Hematology Recent Labs Lab 11/03/16 1527 11/04/16 0353  WBC 15.6* 12.5*  RBC 4.65 4.03*  HGB 12.3* 10.9*  HCT 37.3* 32.3*  MCV 80.2 80.3  MCH 26.4 27.2  MCHC 32.9 33.8  RDW 16.3* 16.2*  PLT 279 231   Cardiac Enzymes Recent Labs Lab 11/03/16 1527 11/03/16 2000 11/04/16 0015 11/04/16 0353  TROPONINI 0.05* 0.05* 0.04* 0.05*   No results for input(s): TROPIPOC in the last 168 hours.  BNP Recent Labs Lab 11/03/16 2000  BNP 70.0    DDimer No results for input(s): DDIMER in the last 168 hours.  Radiology/Studies:  Dg Chest 2 View  Result Date: 11/03/2016 IMPRESSION: Chronic lung disease. Left lower lobe airspace disease may represent pneumonia or atelectasis. Electronically Signed   By: Franchot Gallo M.D.   On:  11/03/2016 17:25   Ct Angio Chest Pe W Or Wo Contrast  Result Date: 11/05/2016 IMPRESSION: Bilateral lower lobe airspace opacities concerning for pneumonia. Trace bilateral effusions. Borderline size right scratched at borderline sized  hilar and mediastinal lymph nodes, likely reactive. Small hiatal hernia. Ectasia of the proximal descending thoracic aorta, 3.7 cm maximally. No evidence of pulmonary embolus. Aortic Atherosclerosis (ICD10-I70.0) and Emphysema (ICD10-J43.9). Electronically Signed   By: Rolm Baptise M.D.   On: 11/05/2016 10:22   Dg Chest Port 1 View  Result Date: 11/05/2016 IMPRESSION: Improved aeration left retrocardiac region. Electronically Signed   By: Inez Catalina M.D.   On: 11/05/2016 07:49    Assessment and Plan:   1. New onset Afib with RVR: -Converted to sinus rhythm on Cardizem gtt -Likely in the setting of hypomagnesemia and hypokalemia, which are both improved, along with possible PNA and mild volume overload -Transition IV Cardizem gtt to PO Cardizem CD 120 mg daily -Continue heparin gtt until echo is formally done and read -He will need DOAC prior to discharge given his CHADS2VASc of at least 7 (HTN, age x 2, DM, stroke x 2, vascular disease)  2. Acute respiratory distress: -CTA negative for PE (performed given recent travel to and from [place] as above -Likely 2/2 PNA, AECOPD, and volume overload -ABX per IM -Gentle diuresis as below  3. Acute on chronic diastolic CHF: -Echo pending -Gentle diuresis  -Monitor renal function -Consider spiro as an outpatient  4. Elevated troponin: -Not consistent with NSTEMI -Likely supply demand ischemia in the setting of PNA, Afib with RVR, and volume overload -Will need outpatient ischemic evaluation with primary cardiologist  -Will be on DOAC in place of ASA -Consider adding beta blocker as respiratory status improves -Check lipid panel for further risk stratification   5. Mitral valve regurgitation: -Echo as  above  6. PNA/AECOPD: -Per IM  7. Electrolyte abnormalities: -Replete magnesium and potassium to goal   8. Hypertension: -Cardizem in place of amlodipine as above -Continue PTA benazepril   9. HLD: -Lipitor -FLP as above  10. DM2: -Per IM    For questions or updates, please contact [place] Please consult [email or web address] for contact info under Cardiology/STEMI.   Signed, Christell Faith, PA-C [place] Pager: [phone] 11/05/2016, 10:54 AM

## 2016-11-05 NOTE — Progress Notes (Signed)
[facility] NAME: Gabriel Brewer    MR#:  [id number]  DATE OF BIRTH:  1940-07-21  SUBJECTIVE:  CHIEF COMPLAINT:   Chief Complaint  Patient presents with  . Shortness of Breath  . Weakness   - Admitted with bilateral pneumonia, hypoxic last night also A. fib with RVR new onset -Currently and cardiology floor, off Cardizem drip this morning. -Also on heparin drip. -Down to 2 L nasal cannula. Patient states he is feeling much better today  REVIEW OF SYSTEMS:  Review of Systems  Constitutional: Positive for malaise/fatigue. Negative for chills and fever.  HENT: Positive for congestion. Negative for ear discharge, hearing loss and nosebleeds.   Eyes: Negative for blurred vision and double vision.  Respiratory: Positive for cough and shortness of breath. Negative for wheezing.   Cardiovascular: Negative for chest pain, palpitations and leg swelling.  Gastrointestinal: Negative for abdominal pain, constipation, diarrhea, nausea and vomiting.  Genitourinary: Negative for dysuria.  Musculoskeletal: Negative for myalgias.  Neurological: Negative for dizziness, sensory change, speech change, focal weakness, seizures and headaches.  Psychiatric/Behavioral: Negative for depression.    DRUG ALLERGIES:  No Known Allergies  VITALS:  Blood pressure (!) 144/65, pulse 83, temperature 99.1 F (37.3 C), temperature source Oral, resp. rate 20, height 5\' 11"  (1.803 m), weight 75.4 kg (166 lb 4.8 oz), SpO2 92 %.  PHYSICAL EXAMINATION:  Physical Exam  GENERAL:  76 y.o.-year-old patient lying in the bed with no acute distress.  EYES: Pupils equal, round, reactive to light and accommodation. No scleral icterus. Extraocular muscles intact.  HEENT: Head atraumatic, normocephalic. Oropharynx and nasopharynx clear.  NECK:  Supple, no jugular venous distention. No thyroid enlargement, no tenderness.  LUNGS: Normal breath sounds bilaterally, improved  expiratory wheezing  But diffuse rhonchi at the bases.no wheezing, rales or crepitation. No use of accessory muscles of respiration.  CARDIOVASCULAR: S1, S2 normal. No murmurs, rubs, or gallops.  ABDOMEN: Soft, nontender, nondistended. Bowel sounds present. No organomegaly or mass.  EXTREMITIES: No pedal edema, cyanosis, or clubbing.  NEUROLOGIC: Cranial nerves II through XII are intact. Muscle strength 5/5 in all extremities. Sensation intact. Gait not checked. Global weakness noted. PSYCHIATRIC: The patient is alert and oriented x 3.  SKIN: No obvious rash, lesion, or ulcer.    LABORATORY PANEL:   CBC  Recent Labs Lab 11/04/16 0353  WBC 12.5*  HGB 10.9*  HCT 32.3*  PLT 231   ------------------------------------------------------------------------------------------------------------------  Chemistries   Recent Labs Lab 11/03/16 1527  11/05/16 0723  NA 133*  < > 135  K 3.5  < > 3.6  CL 100*  < > 103  CO2 22  < > 24  GLUCOSE 122*  < > 125*  BUN 20  < > 13  CREATININE 1.24  < > 0.99  CALCIUM 8.4*  < > 8.3*  MG  --   < > 2.0  AST 42*  --   --   ALT 38  --   --   ALKPHOS 150*  --   --   BILITOT 0.7  --   --   < > = values in this interval not displayed. ------------------------------------------------------------------------------------------------------------------  Cardiac Enzymes  Recent Labs Lab 11/04/16 0353  TROPONINI 0.05*   ------------------------------------------------------------------------------------------------------------------  RADIOLOGY:  Dg Chest 2 View  Result Date: 11/03/2016 CLINICAL DATA:  Cough and fever EXAM: CHEST  2 VIEW COMPARISON:  09/15/2015 FINDINGS: Chronic lung disease with prominent lung markings bilaterally. Patchy mild  airspace disease in the left base is new and could represent atelectasis or pneumonia. There is apical pleural scarring bilaterally. Negative for heart failure or effusion. Negative for mass or adenopathy. No  acute skeletal abnormality. IMPRESSION: Chronic lung disease. Left lower lobe airspace disease may represent pneumonia or atelectasis. Electronically Signed   By: Franchot Gallo M.D.   On: 11/03/2016 17:25   Ct Angio Chest Pe W Or Wo Contrast  Result Date: 11/05/2016 CLINICAL DATA:  Shortness of breath, cough EXAM: CT ANGIOGRAPHY CHEST WITH CONTRAST TECHNIQUE: Multidetector CT imaging of the chest was performed using the standard protocol during bolus administration of intravenous contrast. Multiplanar CT image reconstructions and MIPs were obtained to evaluate the vascular anatomy. CONTRAST:  75 cc Isovue 370 IV COMPARISON:  Chest x-ray earlier today.  Chest CT 09/16/2015 FINDINGS: Cardiovascular: No filling defects in the pulmonary arteries to suggest pulmonary emboli. Ectasia of the proximal descending thoracic aorta, 3.8 cm. Scattered aortic calcifications. Heart is borderline in size. Mediastinum/Nodes: Mildly prominent mediastinal and bilateral hilar lymph nodes, increased since prior study. Right hilar lymph node has a short axis diameter of 1.7 cm. Right subcarinal lymph node has a short axis diameter of 11 mm. No axillary adenopathy. Small hiatal hernia. Lungs/Pleura: Mild emphysema. Extensive bilateral lower lobe airspace opacities. Trace bilateral effusions. Findings concerning for pneumonia. Upper Abdomen: Imaging into the upper abdomen shows no acute findings. Musculoskeletal: Chest wall soft tissues are unremarkable. No acute bony abnormality. Review of the MIP images confirms the above findings. IMPRESSION: Bilateral lower lobe airspace opacities concerning for pneumonia. Trace bilateral effusions. Borderline size right scratched at borderline sized hilar and mediastinal lymph nodes, likely reactive. Small hiatal hernia. Ectasia of the proximal descending thoracic aorta, 3.7 cm maximally. No evidence of pulmonary embolus. Aortic Atherosclerosis (ICD10-I70.0) and Emphysema (ICD10-J43.9).  Electronically Signed   By: Rolm Baptise M.D.   On: 11/05/2016 10:22   Dg Chest Port 1 View  Result Date: 11/05/2016 CLINICAL DATA:  Shortness of Breath EXAM: PORTABLE CHEST 1 VIEW COMPARISON:  11/03/2016 FINDINGS: Cardiac shadow is within normal limits. The lungs are well aerated bilaterally. Mild persistent scarring is noted in the left lung base. Some improved aeration in the left retrocardiac region is noted. No acute bony abnormality is seen. IMPRESSION: Improved aeration left retrocardiac region. Electronically Signed   By: Inez Catalina M.D.   On: 11/05/2016 07:49    EKG:   Orders placed or performed during the hospital encounter of 11/03/16  . ED EKG  . ED EKG  . EKG 12-Lead  . EKG 12-Lead  . EKG 12-Lead  . EKG 12-Lead  . EKG 12-Lead  . EKG 12-Lead    ASSESSMENT AND PLAN:   76 year old male with past medical history of diabetes, hypertension, hyperlipidemia who presents to the hospital due to shortness of breath, cough, weakness and noted to have a pneumonia.  #1 Sepsis- secondary to community acquired pneumonia -blood cultures negative so far. Influenza PCR is negative -on rocephin and azithromycin  #2 COPD-stable, no indication for systemic steroids -Continue nebulizer treatments and cough medications. -Added Claritin for congestion  #3 new onset atrial fibrillation with RVR-currently in sinus rhythm. Cardizem drip has been turned off -On oral Cardizem. Echocardiogram is pending -Continue heparin drip at this time, will need to be changed to oral and decortication prior to discharge.  - appreciate cardiology consult  #4 hypokalemia and hypomagnesemia-replaced aggressively  #5 acute on chronic diastolic CHF-secondary to IV fluids received yesterday. Gentle diuresis as needed.  Monitor renal function.  #5 diabetes mellitus-on metformin, add sliding scale insulin -A1c is 5.8  #6 DVT Prophylaxis- lovenox   Patient and wife updated at bedside. Physical therapy  consult is pending   All the records are reviewed and case discussed with Care Management/Social Workerr. Management plans discussed with the patient, family and they are in agreement.  CODE STATUS: Full Code  TOTAL TIME TAKING CARE OF THIS PATIENT: 37 minutes.   POSSIBLE D/C IN 1-2 DAYS, DEPENDING ON CLINICAL CONDITION.   Gladstone Lighter M.D on 11/05/2016 at 1:22 PM  Between 7am to 6pm - Pager - [phone]  After 6pm go to [email or web address] - password [email or web address] [facility]  Office  [phone]  CC: Primary care physician; Farley Ly, MD

## 2016-11-06 DIAGNOSIS — R0603 Acute respiratory distress: Secondary | ICD-10-CM

## 2016-11-06 LAB — CBC
HEMATOCRIT: 37 % — AB (ref 40.0–52.0)
Hemoglobin: 12.4 g/dL — ABNORMAL LOW (ref 13.0–18.0)
MCH: 27 pg (ref 26.0–34.0)
MCHC: 33.6 g/dL (ref 32.0–36.0)
MCV: 80.3 fL (ref 80.0–100.0)
Platelets: 291 10*3/uL (ref 150–440)
RBC: 4.61 MIL/uL (ref 4.40–5.90)
RDW: 16 % — AB (ref 11.5–14.5)
WBC: 10.5 10*3/uL (ref 3.8–10.6)

## 2016-11-06 LAB — BASIC METABOLIC PANEL
ANION GAP: 10 (ref 5–15)
BUN: 15 mg/dL (ref 6–20)
CALCIUM: 8.5 mg/dL — AB (ref 8.9–10.3)
CO2: 25 mmol/L (ref 22–32)
CREATININE: 1.13 mg/dL (ref 0.61–1.24)
Chloride: 99 mmol/L — ABNORMAL LOW (ref 101–111)
GFR calc Af Amer: >60 mL/min (ref >60)
GFR calc non Af Amer: >60 mL/min (ref >60)
GLUCOSE: 147 mg/dL — AB (ref 65–99)
Potassium: 4.3 mmol/L (ref 3.5–5.1)
Sodium: 134 mmol/L — ABNORMAL LOW (ref 135–145)

## 2016-11-06 LAB — ECHOCARDIOGRAM COMPLETE
Height: 71 in
WEIGHTICAEL: 2660.8 [oz_av]

## 2016-11-06 LAB — GLUCOSE, CAPILLARY
GLUCOSE-CAPILLARY: 128 mg/dL — AB (ref 65–99)
Glucose-Capillary: 110 mg/dL — ABNORMAL HIGH (ref 65–99)
Glucose-Capillary: 119 mg/dL — ABNORMAL HIGH (ref 65–99)
Glucose-Capillary: 127 mg/dL — ABNORMAL HIGH (ref 65–99)

## 2016-11-06 LAB — HEPARIN LEVEL (UNFRACTIONATED): Heparin Unfractionated: 0.29 IU/mL — ABNORMAL LOW (ref 0.30–0.70)

## 2016-11-06 MED ORDER — RIVAROXABAN 20 MG PO TABS
20.0000 mg | ORAL_TABLET | Freq: Once | ORAL | Status: AC
  Administered 2016-11-06: 20 mg via ORAL
  Filled 2016-11-06: qty 1

## 2016-11-06 MED ORDER — HEPARIN BOLUS VIA INFUSION
2250.0000 [IU] | Freq: Once | INTRAVENOUS | Status: AC
  Administered 2016-11-06: 2250 [IU] via INTRAVENOUS
  Filled 2016-11-06: qty 2250

## 2016-11-06 MED ORDER — IPRATROPIUM-ALBUTEROL 0.5-2.5 (3) MG/3ML IN SOLN
3.0000 mL | Freq: Three times a day (TID) | RESPIRATORY_TRACT | Status: DC
  Administered 2016-11-07: 3 mL via RESPIRATORY_TRACT
  Filled 2016-11-06: qty 3

## 2016-11-06 MED ORDER — RIVAROXABAN 20 MG PO TABS: 20.0000 mg | ORAL_TABLET | Freq: Every day | ORAL | Status: DC

## 2016-11-06 NOTE — Progress Notes (Signed)
ANTICOAGULATION CONSULT NOTE   Pharmacy Consult for heparin Indication: atrial fibrillation  No Known Allergies  Patient Measurements: Height: 5\' 11"  (180.3 cm) Weight: 166 lb 4.8 oz (75.4 kg) IBW/kg (Calculated) : 75.3 Heparin Dosing Weight: 75.4 kg  Vital Signs: Temp: 98.5 F (36.9 C) (10/23 1946) Temp Source: Oral (10/23 1946) BP: 130/65 (10/23 1946) Pulse Rate: 80 (10/23 1946)  Labs:  Recent Labs  11/03/16 2000 11/04/16 0015 11/04/16 0353 11/04/16 2312 11/05/16 0723 11/05/16 1730 11/05/16 1920 11/06/16 0239  HGB  --   --  10.9*  --   --   --  11.9* 12.4*  HCT  --   --  32.3*  --   --   --  35.0* 37.0*  PLT  --   --  231  --   --   --  301 291  APTT  --   --   --  43*  --   --   --   --   LABPROT  --   --   --  15.0  --   --   --   --   INR  --   --   --  1.19  --   --   --   --   HEPARINUNFRC  --   --   --   --  0.22* 0.11*  --  0.29*  CREATININE  --   --  0.93  --  0.99  --   --  1.13  TROPONINI 0.05* 0.04* 0.05*  --   --   --   --   --     Estimated Creatinine Clearance: 59.2 mL/min (by C-G formula based on SCr of 1.13 mg/dL).   Assessment: Patient admitted w/ SOB found to be in afib w/ RVR w/ rate of 146 bpm on EKG.  Goal of Therapy:  Heparin level 0.3-0.7 units/ml Monitor platelets by anticoagulation protocol: Yes   Plan:  HL = 0.11 is subtherapeutic on infusion of 1100 units/hr. Spoke with RN - no interruptions in infusion or issues with IV access.  Ordered heparin bolus of 2200 units and increased heparin infusion to 1250 units/hr. Will check HL in 6 hours and CBC with AM labs tomorrow.  10/24 @ 0239 HL 0.29 subtherapeutic. Will rebolus w/ heparin 2250 units IV x 1 and will increase rate to 1350 units/hr and will recheck HL @ 1200. Will recheck CBC w/ am labs.  Pharmacy will continue to follow.  Tobie Lords, PharmD Clinical Pharmacist 11/06/2016 4:20 AM

## 2016-11-06 NOTE — Progress Notes (Signed)
ANTICOAGULATION CONSULT NOTE - Initial Consult  Pharmacy Consult for Xarelto Indication: atrial fibrillation  No Known Allergies  Patient Measurements: Height: 5\' 11"  (180.3 cm) Weight: 162 lb (73.5 kg) IBW/kg (Calculated) : 75.3 Heparin Dosing Weight:    Vital Signs: Temp: 97.8 F (36.6 C) (10/24 0801) Temp Source: Oral (10/24 0801) BP: 125/74 (10/24 0801) Pulse Rate: 87 (10/24 1056)  Labs:  Recent Labs  11/03/16 2000 11/04/16 0015 11/04/16 0353 11/04/16 2312 11/05/16 0723 11/05/16 1730 11/05/16 1920 11/06/16 0239  HGB  --   --  10.9*  --   --   --  11.9* 12.4*  HCT  --   --  32.3*  --   --   --  35.0* 37.0*  PLT  --   --  231  --   --   --  301 291  APTT  --   --   --  43*  --   --   --   --   LABPROT  --   --   --  15.0  --   --   --   --   INR  --   --   --  1.19  --   --   --   --   HEPARINUNFRC  --   --   --   --  0.22* 0.11*  --  0.29*  CREATININE  --   --  0.93  --  0.99  --   --  1.13  TROPONINI 0.05* 0.04* 0.05*  --   --   --   --   --     Estimated Creatinine Clearance: 57.8 mL/min (by C-G formula based on SCr of 1.13 mg/dL).   Medical History: Past Medical History:  Diagnosis Date  . Cancer ([place]) 2017   skin   . Diabetes mellitus without complication ([place])   . GERD (gastroesophageal reflux disease)   . Hyperlipidemia   . Hypertension      Assessment: Patient admitted w/ SOB found to be in afib w/ RVR  Transitioning from Heparin drip to Xarelto 10/24.  Goal of Therapy:  Monitor platelets by anticoagulation protocol: Yes   Plan:  Heparin drip discontinued. Will start Xarelto 20 mg daily today.  Alice Vitelli A 11/06/2016,10:59 AM

## 2016-11-06 NOTE — Progress Notes (Signed)
Progress Note  Patient Name: Gabriel Brewer Date of Encounter: 11/06/2016  Primary Cardiologist: [facility]  Subjective   No chest pain or palpitations. SOB about the same. Remains on supplemental oxygen via nasal cannula. Echo pending. Has remained in sinus rhythm. On heparin gtt.   Inpatient Medications    Scheduled Meds: . aspirin EC  81 mg Oral Daily  . atorvastatin  10 mg Oral QPM  . azithromycin  250 mg Oral Daily  . benazepril  40 mg Oral QPM  . budesonide (PULMICORT) nebulizer solution  0.5 mg Nebulization BID  . cholecalciferol  4,000 Units Oral Q7 days  . dextromethorphan  30 mg Oral BID   And  . guaiFENesin  600 mg Oral BID  . diltiazem  120 mg Oral Daily  . furosemide  20 mg Intravenous Q12H  . insulin aspart  0-5 Units Subcutaneous QHS  . insulin aspart  0-9 Units Subcutaneous TID WC  . ipratropium-albuterol  3 mL Nebulization Q6H  . loratadine  10 mg Oral Daily  . pantoprazole  40 mg Oral Daily  . sodium chloride flush  3 mL Intravenous Q12H   Continuous Infusions: . cefTRIAXone (ROCEPHIN)  IV Stopped (11/05/16 2044)  . heparin 1,350 Units/hr (11/06/16 0427)  . magnesium sulfate 1 - 4 g bolus IVPB     PRN Meds: acetaminophen **OR** acetaminophen, guaiFENesin, ondansetron **OR** ondansetron (ZOFRAN) IV   Vital Signs    Vitals:   11/05/16 2135 11/06/16 0424 11/06/16 0500 11/06/16 0801  BP:  (!) 141/76  125/74  Pulse:  85  80  Resp:    18  Temp:  97.9 F (36.6 C)  97.8 F (36.6 C)  TempSrc:  Oral  Oral  SpO2: 92% 94%  96%  Weight:   162 lb (73.5 kg)   Height:        Intake/Output Summary (Last 24 hours) at 11/06/16 0835 Last data filed at 11/06/16 0726  Gross per 24 hour  Intake          1163.52 ml  Output             4100 ml  Net         -2936.48 ml   Filed Weights   11/03/16 1513 11/05/16 0006 11/06/16 0500  Weight: 175 lb (79.4 kg) 166 lb 4.8 oz (75.4 kg) 162 lb (73.5 kg)    Telemetry    NSR - Personally Reviewed  ECG    n/a -  Personally Reviewed  Physical Exam   GEN: No acute distress.   Neck: No JVD. Cardiac: RRR, no murmurs, rubs, or gallops.  Respiratory: Coarse breath sounds bilaterally.  GI: Soft, nontender, non-distended.   MS: No edema; No deformity. Neuro:  Alert and oriented x 3; Nonfocal.  Psych: Normal affect.  Labs    Chemistry Recent Labs Lab 11/03/16 1527 11/04/16 0353 11/05/16 0723 11/06/16 0239  NA 133* 134* 135 134*  K 3.5 2.8* 3.6 4.3  CL 100* 106 103 99*  CO2 22 23 24 25   GLUCOSE 122* 163* 125* 147*  BUN 20 12 13 15   CREATININE 1.24 0.93 0.99 1.13  CALCIUM 8.4* 8.0* 8.3* 8.5*  PROT 7.1  --   --   --   ALBUMIN 3.2*  --   --   --   AST 42*  --   --   --   ALT 38  --   --   --   ALKPHOS 150*  --   --   --  BILITOT 0.7  --   --   --   GFRNONAA 55* >60 >60 >60  GFRAA >60 >60 >60 >60  ANIONGAP 11 5 8 10      Hematology Recent Labs Lab 11/04/16 0353 11/05/16 1920 11/06/16 0239  WBC 12.5* 10.9* 10.5  RBC 4.03* 4.41 4.61  HGB 10.9* 11.9* 12.4*  HCT 32.3* 35.0* 37.0*  MCV 80.3 79.4* 80.3  MCH 27.2 27.0 27.0  MCHC 33.8 34.1 33.6  RDW 16.2* 16.1* 16.0*  PLT 231 301 291    Cardiac Enzymes Recent Labs Lab 11/03/16 1527 11/03/16 2000 11/04/16 0015 11/04/16 0353  TROPONINI 0.05* 0.05* 0.04* 0.05*   No results for input(s): TROPIPOC in the last 168 hours.   BNP Recent Labs Lab 11/03/16 2000  BNP 70.0     DDimer No results for input(s): DDIMER in the last 168 hours.   Radiology    Ct Angio Chest Pe W Or Wo Contrast  Result Date: 11/05/2016 IMPRESSION: Bilateral lower lobe airspace opacities concerning for pneumonia. Trace bilateral effusions. Borderline size right scratched at borderline sized hilar and mediastinal lymph nodes, likely reactive. Small hiatal hernia. Ectasia of the proximal descending thoracic aorta, 3.7 cm maximally. No evidence of pulmonary embolus. Aortic Atherosclerosis (ICD10-I70.0) and Emphysema (ICD10-J43.9). Electronically Signed    By: Rolm Baptise M.D.   On: 11/05/2016 10:22   Dg Chest Port 1 View  Result Date: 11/05/2016 IMPRESSION: Improved aeration left retrocardiac region. Electronically Signed   By: Inez Catalina M.D.   On: 11/05/2016 07:49    Cardiac Studies   TTE pending.  TTE 08/05/2016: IMPRESSIONS Normal left ventricular ejection fraction estimated at 60-65%. Abnormal relaxation filling pattern of the left ventricle for age (stage 1 diastolic dysfunction). Mild mitral regurgitation with eccentric jet. Mild tricuspid regurgitation.  Patient Profile     76 y.o. male with history of non-rheumatic mitral regurgitation with history of mitral valve bacterial endocarditis in 03/2015 in the setting of MRSA bacteremia/sepsis, diastolic dysfunction, priro stroke involving the right eye x 2, COPD, DM2, HTN, and HLD who is being seen today for the evaluation of new onset Afib with RVR at the request of Dr. Jannifer Franklin, MD.  Assessment & Plan    1. New onset Afib with RVR: -Converted to sinus rhythm on Cardizem gtt, has remained in NSR -Likely in the setting of hypomagnesemia and hypokalemia, which are both improved, along with possible PNA and mild volume overload -Continue PO Cardizem CD 120 mg daily -Continue heparin gtt until echo is formally done and read -He would like to change to Xarelto 20 mg q dinner prior to discharge  -Would stop ASA when starting DOAC -CHADS2VASc of at least 7 (HTN, age x 2, DM, stroke x 2, vascular disease)  2. Acute respiratory distress: -CTA negative for PE (performed given recent travel to and from [place] as above -Likely 2/2 PNA, AECOPD, and volume overload -ABX per IM -Gentle diuresis as below  3. Acute on chronic diastolic CHF: -Echo pending -Gentle diuresis  -Monitor renal function -Consider spiro as an outpatient  4. Elevated troponin: -Not consistent with NSTEMI -Likely supply demand ischemia in the setting of PNA, Afib with RVR, and volume overload -Will need  outpatient ischemic evaluation with primary cardiologist  -Will be on DOAC in place of ASA -Consider adding beta blocker as respiratory status improves -Check lipid panel for further risk stratification   5. Mitral valve regurgitation: -Echo as above  6. PNA/AECOPD: -Per IM  7. Electrolyte abnormalities: -Replete magnesium and  potassium to goal   8. Hypertension: -Cardizem in place of amlodipine as above -Continue PTA benazepril   9. HLD: -Lipitor -FLP as above  10. DM2: -Per IM  For questions or updates, please contact [place] Please consult [email or web address] for contact info under Cardiology/STEMI.    Signed, Christell Faith, PA-C [place] Pager: [phone] 11/06/2016, 8:35 AM   Attending Note Patient seen and examined, agree with detailed note above,  Patient presentation and plan discussed on rounds.   Breathing mildly improved, still on 4 L nasal cannula oxygen Echocardiogram results reviewed with him showing normal LV function Does report significant prior smoking history but does not use oxygen at home  On clinical exam coarse breath sounds, moderately reduced diffusely, heart sounds regular rate and rhythm, abdomen soft nontender no significant lower extreme edema   lab work reviewed as above  A/P: 1) atrial fibrillation with RVR Converted to normal sinus rhythm Likely secondary to COPD exacerbation High risk of recurrent atrial fi2) brillation, elevated CHADS VASC  Would recommend long-term NOAC.  Discussed with him in detail, he would prefer once a day xarelto Stop aspirin, stop heparin infusion,  Start xarelto 20 mg daily   2) acute respiratory distress COPD exacerbation on antibiotics, nebulizers  Greater than 50% was spent in counseling and coordination of care with patient Total encounter time 25 minutes or more   Signed: Esmond Plants  M.D., Ph.D. [facility]

## 2016-11-06 NOTE — Progress Notes (Signed)
Pt walked around nurses station and tolerated well, vss no complaints of pain. Pt off oxygen Sat 95% on R/A. Will continue to monitor.

## 2016-11-06 NOTE — Progress Notes (Signed)
[place] [facility] [place] NAME: Gabriel Brewer    MR#:  [id number]  DATE OF BIRTH:  1940/03/05  SUBJECTIVE:  CHIEF COMPLAINT:   Chief Complaint  Patient presents with  . Shortness of Breath  . Weakness   -still on 3L o2, breathing is improved - in NSR now  REVIEW OF SYSTEMS:  Review of Systems  Constitutional: Positive for malaise/fatigue. Negative for chills and fever.  HENT: Positive for congestion. Negative for ear discharge, hearing loss and nosebleeds.   Eyes: Negative for blurred vision and double vision.  Respiratory: Positive for cough and shortness of breath. Negative for wheezing.   Cardiovascular: Negative for chest pain, palpitations and leg swelling.  Gastrointestinal: Negative for abdominal pain, constipation, diarrhea, nausea and vomiting.  Genitourinary: Negative for dysuria.  Musculoskeletal: Negative for myalgias.  Neurological: Negative for dizziness, sensory change, speech change, focal weakness, seizures and headaches.  Psychiatric/Behavioral: Negative for depression.    DRUG ALLERGIES:  No Known Allergies  VITALS:  Blood pressure 125/74, pulse 91, temperature 97.8 F (36.6 C), temperature source Oral, resp. rate 18, height 5\' 11"  (1.803 m), weight 73.5 kg (162 lb), SpO2 94 %.  PHYSICAL EXAMINATION:  Physical Exam  GENERAL:  76 y.o.-year-old patient lying in the bed with no acute distress.  EYES: Pupils equal, round, reactive to light and accommodation. No scleral icterus. Extraocular muscles intact.  HEENT: Head atraumatic, normocephalic. Oropharynx and nasopharynx clear.  NECK:  Supple, no jugular venous distention. No thyroid enlargement, no tenderness.  LUNGS: Normal breath sounds bilaterally, improved expiratory wheezing  But diffuse rhonchi at the bases.no wheezing, rales or crepitation. No use of accessory muscles of respiration.  CARDIOVASCULAR: S1, S2 normal. No murmurs, rubs, or gallops.  ABDOMEN: Soft,  nontender, nondistended. Bowel sounds present. No organomegaly or mass.  EXTREMITIES: No pedal edema, cyanosis, or clubbing.  NEUROLOGIC: Cranial nerves II through XII are intact. Muscle strength 5/5 in all extremities. Sensation intact. Gait not checked. Global weakness noted. PSYCHIATRIC: The patient is alert and oriented x 3.  SKIN: No obvious rash, lesion, or ulcer.    LABORATORY PANEL:   CBC  Recent Labs Lab 11/06/16 0239  WBC 10.5  HGB 12.4*  HCT 37.0*  PLT 291   ------------------------------------------------------------------------------------------------------------------  Chemistries   Recent Labs Lab 11/03/16 1527  11/05/16 0723 11/06/16 0239  NA 133*  < > 135 134*  K 3.5  < > 3.6 4.3  CL 100*  < > 103 99*  CO2 22  < > 24 25  GLUCOSE 122*  < > 125* 147*  BUN 20  < > 13 15  CREATININE 1.24  < > 0.99 1.13  CALCIUM 8.4*  < > 8.3* 8.5*  MG  --   < > 2.0  --   AST 42*  --   --   --   ALT 38  --   --   --   ALKPHOS 150*  --   --   --   BILITOT 0.7  --   --   --   < > = values in this interval not displayed. ------------------------------------------------------------------------------------------------------------------  Cardiac Enzymes  Recent Labs Lab 11/04/16 0353  TROPONINI 0.05*   ------------------------------------------------------------------------------------------------------------------  RADIOLOGY:  Ct Angio Chest Pe W Or Wo Contrast  Result Date: 11/05/2016 CLINICAL DATA:  Shortness of breath, cough EXAM: CT ANGIOGRAPHY CHEST WITH CONTRAST TECHNIQUE: Multidetector CT imaging of the chest was performed using the standard protocol during bolus administration of  intravenous contrast. Multiplanar CT image reconstructions and MIPs were obtained to evaluate the vascular anatomy. CONTRAST:  75 cc Isovue 370 IV COMPARISON:  Chest x-ray earlier today.  Chest CT 09/16/2015 FINDINGS: Cardiovascular: No filling defects in the pulmonary arteries to  suggest pulmonary emboli. Ectasia of the proximal descending thoracic aorta, 3.8 cm. Scattered aortic calcifications. Heart is borderline in size. Mediastinum/Nodes: Mildly prominent mediastinal and bilateral hilar lymph nodes, increased since prior study. Right hilar lymph node has a short axis diameter of 1.7 cm. Right subcarinal lymph node has a short axis diameter of 11 mm. No axillary adenopathy. Small hiatal hernia. Lungs/Pleura: Mild emphysema. Extensive bilateral lower lobe airspace opacities. Trace bilateral effusions. Findings concerning for pneumonia. Upper Abdomen: Imaging into the upper abdomen shows no acute findings. Musculoskeletal: Chest wall soft tissues are unremarkable. No acute bony abnormality. Review of the MIP images confirms the above findings. IMPRESSION: Bilateral lower lobe airspace opacities concerning for pneumonia. Trace bilateral effusions. Borderline size right scratched at borderline sized hilar and mediastinal lymph nodes, likely reactive. Small hiatal hernia. Ectasia of the proximal descending thoracic aorta, 3.7 cm maximally. No evidence of pulmonary embolus. Aortic Atherosclerosis (ICD10-I70.0) and Emphysema (ICD10-J43.9). Electronically Signed   By: Rolm Baptise M.D.   On: 11/05/2016 10:22   Dg Chest Port 1 View  Result Date: 11/05/2016 CLINICAL DATA:  Shortness of Breath EXAM: PORTABLE CHEST 1 VIEW COMPARISON:  11/03/2016 FINDINGS: Cardiac shadow is within normal limits. The lungs are well aerated bilaterally. Mild persistent scarring is noted in the left lung base. Some improved aeration in the left retrocardiac region is noted. No acute bony abnormality is seen. IMPRESSION: Improved aeration left retrocardiac region. Electronically Signed   By: Inez Catalina M.D.   On: 11/05/2016 07:49    EKG:   Orders placed or performed during the hospital encounter of 11/03/16  . ED EKG  . ED EKG  . EKG 12-Lead  . EKG 12-Lead  . EKG 12-Lead  . EKG 12-Lead  . EKG 12-Lead    . EKG 12-Lead    ASSESSMENT AND PLAN:   76 year old male with past medical history of diabetes, hypertension, hyperlipidemia who presents to the hospital due to shortness of breath, cough, weakness and noted to have a pneumonia.  #1 Sepsis- secondary to community acquired pneumonia -blood cultures negative so far. Influenza PCR is negative -on rocephin and azithromycin- treat for 7 days  #2 COPD-stable, no indication for systemic steroids -Continue nebulizer treatments and cough medications. -Added Claritin for congestion  #3 new onset atrial fibrillation with RVR-currently in sinus rhythm. Cardizem drip has been turned off -On oral Cardizem. Echocardiogram is normal EF, mild diastolic dysfunction noted -discontinue heparin drip , since [place] vasc score is elevated, started oral anticoagulation with xarelto - appreciate cardiology consult  #4 hypokalemia and hypomagnesemia-replaced   #5 acute on chronic diastolic CHF-secondary to IV fluids received on admission.  On IV Lasix currently.  Continue for today.  Will discontinue tomorrow.  Gentle diuresis as needed. Monitor renal function.  #5 diabetes mellitus-on metformin-Metformin held due to receiving contrast for CT., add sliding scale insulin -A1c is 5.8  #6 DVT Prophylaxis- lovenox   Encourage ambulation today   All the records are reviewed and case discussed with Care Management/Social Workerr. Management plans discussed with the patient, family and they are in agreement.  CODE STATUS: Full Code  TOTAL TIME TAKING CARE OF THIS PATIENT: 35 minutes.   POSSIBLE D/C IN 1-2 DAYS, DEPENDING ON CLINICAL CONDITION.  Gladstone Lighter M.D on 11/06/2016 at 10:44 AM  Between 7am to 6pm - Pager - [phone]  After 6pm go to [email or web address] - password [email or web address] [facility]  Office  [phone]  CC: Primary care physician; Farley Ly, MD

## 2016-11-06 NOTE — Plan of Care (Signed)
Problem: Education: Goal: Knowledge of [facility] information/materials will improve Outcome: Progressing Pt ambulated around nurses station and tolerated well. Weaned off O2. Sat 95% on R/A. Will continue to monitor.

## 2016-11-06 NOTE — Care Management (Signed)
Provided patient with 30 day trial Xarelto coupon.  Verbally confirms he has Medicare part D coverage.

## 2016-11-07 DIAGNOSIS — I480 Paroxysmal atrial fibrillation: Secondary | ICD-10-CM

## 2016-11-07 DIAGNOSIS — J441 Chronic obstructive pulmonary disease with (acute) exacerbation: Secondary | ICD-10-CM

## 2016-11-07 LAB — CBC
HEMATOCRIT: 40.1 % (ref 40.0–52.0)
Hemoglobin: 13.4 g/dL (ref 13.0–18.0)
MCH: 26.8 pg (ref 26.0–34.0)
MCHC: 33.3 g/dL (ref 32.0–36.0)
MCV: 80.3 fL (ref 80.0–100.0)
PLATELETS: 350 10*3/uL (ref 150–440)
RBC: 4.99 MIL/uL (ref 4.40–5.90)
RDW: 16 % — ABNORMAL HIGH (ref 11.5–14.5)
WBC: 10.7 10*3/uL — AB (ref 3.8–10.6)

## 2016-11-07 LAB — BASIC METABOLIC PANEL
Anion gap: 12 (ref 5–15)
BUN: 22 mg/dL — AB (ref 6–20)
CALCIUM: 8.9 mg/dL (ref 8.9–10.3)
CO2: 25 mmol/L (ref 22–32)
CREATININE: 1.18 mg/dL (ref 0.61–1.24)
Chloride: 97 mmol/L — ABNORMAL LOW (ref 101–111)
GFR, EST NON AFRICAN AMERICAN: 58 mL/min — AB (ref >60)
Glucose, Bld: 143 mg/dL — ABNORMAL HIGH (ref 65–99)
Potassium: 4 mmol/L (ref 3.5–5.1)
SODIUM: 134 mmol/L — AB (ref 135–145)

## 2016-11-07 LAB — GLUCOSE, CAPILLARY
GLUCOSE-CAPILLARY: 108 mg/dL — AB (ref 65–99)
Glucose-Capillary: 111 mg/dL — ABNORMAL HIGH (ref 65–99)

## 2016-11-07 MED ORDER — BENAZEPRIL HCL 40 MG PO TABS: 40.0000 mg | ORAL_TABLET | Freq: Every evening | ORAL | 2 refills | Status: DC

## 2016-11-07 MED ORDER — DILTIAZEM HCL ER COATED BEADS 180 MG PO CP24: 180.0000 mg | ORAL_CAPSULE | Freq: Every day | ORAL | 2 refills | Status: DC

## 2016-11-07 MED ORDER — DILTIAZEM HCL ER COATED BEADS 180 MG PO CP24
180.0000 mg | ORAL_CAPSULE | Freq: Every day | ORAL | Status: DC
  Administered 2016-11-07: 180 mg via ORAL
  Filled 2016-11-07: qty 1

## 2016-11-07 MED ORDER — RIVAROXABAN 20 MG PO TABS: 20.0000 mg | ORAL_TABLET | Freq: Every day | ORAL | 2 refills | Status: DC

## 2016-11-07 MED ORDER — CEFUROXIME AXETIL 500 MG PO TABS: 500.0000 mg | ORAL_TABLET | Freq: Two times a day (BID) | ORAL | 0 refills | Status: DC

## 2016-11-07 NOTE — Progress Notes (Signed)
Gabriel Brewer to be D/C'd Home per MD order.  Discussed prescriptions and follow up appointments with the patient. Prescriptions electronically sent to [facility], medication list explained in detail. Pt verbalized understanding.  Allergies as of 11/07/2016   No Known Allergies     Medication List    STOP taking these medications   amLODipine-benazepril 5-40 MG capsule Commonly known as:  LOTREL   aspirin EC 81 MG tablet     TAKE these medications   atorvastatin 10 MG tablet Commonly known as:  LIPITOR Take 1 tablet by mouth daily.   benazepril 40 MG tablet Commonly known as:  LOTENSIN Take 1 tablet (40 mg total) by mouth every evening.   cefUROXime 500 MG tablet Commonly known as:  CEFTIN Take 1 tablet (500 mg total) by mouth 2 (two) times daily with a meal. X 1 week   Cholecalciferol 4000 units Caps Take 1 capsule by mouth every 7 (seven) days.   diltiazem 180 MG 24 hr capsule Commonly known as:  CARDIZEM CD Take 1 capsule (180 mg total) by mouth daily.   metFORMIN 500 MG tablet Commonly known as:  GLUCOPHAGE Take 500 mg by mouth 2 (two) times daily.   omeprazole 40 MG capsule Commonly known as:  PRILOSEC Take 1 capsule by mouth daily.   rivaroxaban 20 MG Tabs tablet Commonly known as:  XARELTO Take 1 tablet (20 mg total) by mouth daily with supper.       Vitals:   11/07/16 0730 11/07/16 1240  BP: 122/65 109/61  Pulse: 87 (!) 108  Resp: 18 18  Temp: 97.9 F (36.6 C)   SpO2: 97% 92%    Skin clean, dry and intact without evidence of skin break down, no evidence of skin tears noted. IV catheter discontinued intact. Site without signs and symptoms of complications. Dressing and pressure applied. Tele box removed and returned.Pt denies pain at this time. No complaints noted.  An After Visit Summary was printed and given to the patient. Patient escorted via [place], and D/C home via private auto. Son at bedside.  Rolley Sims

## 2016-11-07 NOTE — Discharge Summary (Addendum)
[facility] [place] NAME: Gabriel Brewer    MR#:  [id number]  DATE OF BIRTH:  [birth year]  DATE OF ADMISSION:  11/03/2016   ADMITTING PHYSICIAN: Henreitta Leber, MD  DATE OF DISCHARGE: 11/07/2016  PRIMARY CARE PHYSICIAN: Farley Ly, MD   ADMISSION DIAGNOSIS:   Chronic pneumonia [J18.9]  DISCHARGE DIAGNOSIS:   Active Problems:   Pneumonia   SECONDARY DIAGNOSIS:   Past Medical History:  Diagnosis Date  . Cancer ([place]) 2017   skin   . Diabetes mellitus without complication ([place])   . GERD (gastroesophageal reflux disease)   . Hyperlipidemia   . Hypertension     HOSPITAL COURSE:   76 year old male with past medical history of diabetes, hypertension, hyperlipidemia who presents to the hospital due to shortness of breath, cough, weakness and noted to have a pneumonia.  #1 Sepsis- secondary to community acquired pneumonia -blood cultures negative . Influenza PCR is negative -was on  rocephin and azithromycin- discharge on ceftin for 1 week  #2 new onset atrial fibrillation with RVR-currently in sinus rhythm.  - Has been off of Cardizem drip and On oral Cardizem. Echocardiogram is normal EF, mild diastolic dysfunction noted -Received  heparin drip , since [place] vasc score is elevated, started oral anticoagulation with xarelto - appreciate cardiology consult-outpatient follow-up recommended  #3 hypokalemia and hypomagnesemia-replaced   #5 acute on chronic diastolic CHF-secondary to IV fluids received on admission.   -Received IV Lasix in the hospital. Chest x-ray did not show any fluid accumulation -Discontinue Lasix at discharge and encourage low salt diet.  #5 diabetes mellitus-on metformin-Metformin restarted at discharge -A1c is 5.8  Ambulated well, on room air. Will be discharged today   DISCHARGE CONDITIONS:   Guarded  CONSULTS OBTAINED:   Treatment Team:  Nelva Bush, MD  DRUG ALLERGIES:   No  Known Allergies DISCHARGE MEDICATIONS:   Allergies as of 11/07/2016   No Known Allergies     Medication List    STOP taking these medications   amLODipine-benazepril 5-40 MG capsule Commonly known as:  LOTREL   aspirin EC 81 MG tablet     TAKE these medications   atorvastatin 10 MG tablet Commonly known as:  LIPITOR Take 1 tablet by mouth daily.   benazepril 40 MG tablet Commonly known as:  LOTENSIN Take 1 tablet (40 mg total) by mouth every evening.   cefUROXime 500 MG tablet Commonly known as:  CEFTIN Take 1 tablet (500 mg total) by mouth 2 (two) times daily with a meal. X 1 week   Cholecalciferol 4000 units Caps Take 1 capsule by mouth every 7 (seven) days.   diltiazem 180 MG 24 hr capsule Commonly known as:  CARDIZEM CD Take 1 capsule (180 mg total) by mouth daily.   metFORMIN 500 MG tablet Commonly known as:  GLUCOPHAGE Take 500 mg by mouth 2 (two) times daily.   omeprazole 40 MG capsule Commonly known as:  PRILOSEC Take 1 capsule by mouth daily.   rivaroxaban 20 MG Tabs tablet Commonly known as:  XARELTO Take 1 tablet (20 mg total) by mouth daily with supper.        DISCHARGE INSTRUCTIONS:   1. PCP f/u in 1-2 weeks 2. Cardiology f/u in 2-3 weeks  DIET:   Cardiac diet  ACTIVITY:   Activity as tolerated  OXYGEN:   Home Oxygen: No.  Oxygen Delivery: room air  DISCHARGE LOCATION:   home   If you experience  worsening of your admission symptoms, develop shortness of breath, life threatening emergency, suicidal or homicidal thoughts you must seek medical attention immediately by calling 911 or calling your MD immediately  if symptoms less severe.  You Must read complete instructions/literature along with all the possible adverse reactions/side effects for all the Medicines you take and that have been prescribed to you. Take any new Medicines after you have completely understood and accpet all the possible adverse reactions/side effects.    Please note  You were cared for by a hospitalist during your hospital stay. If you have any questions about your discharge medications or the care you received while you were in the hospital after you are discharged, you can call the unit and asked to speak with the hospitalist on call if the hospitalist that took care of you is not available. Once you are discharged, your primary care physician will handle any further medical issues. Please note that NO REFILLS for any discharge medications will be authorized once you are discharged, as it is imperative that you return to your primary care physician (or establish a relationship with a primary care physician if you do not have one) for your aftercare needs so that they can reassess your need for medications and monitor your lab values.    On the day of Discharge:  VITAL SIGNS:   Blood pressure 122/65, pulse 87, temperature 97.9 F (36.6 C), temperature source Oral, resp. rate 18, height 5\' 11"  (1.803 m), weight 72.1 kg (158 lb 14.4 oz), SpO2 97 %.  PHYSICAL EXAMINATION:    GENERAL:  76 y.o.-year-old patient lying in the bed with no acute distress.  EYES: Pupils equal, round, reactive to light and accommodation. No scleral icterus. Extraocular muscles intact.  HEENT: Head atraumatic, normocephalic. Oropharynx and nasopharynx clear.  NECK:  Supple, no jugular venous distention. No thyroid enlargement, no tenderness.  LUNGS: Normal breath sounds bilaterally, improved wheezing  minimal rhonchi at the bases.no wheezing, rales or crepitation. No use of accessory muscles of respiration.  CARDIOVASCULAR: S1, S2 normal. No murmurs, rubs, or gallops.  ABDOMEN: Soft, nontender, nondistended. Bowel sounds present. No organomegaly or mass.  EXTREMITIES: No pedal edema, cyanosis, or clubbing.  NEUROLOGIC: Cranial nerves II through XII are intact. Muscle strength 5/5 in all extremities. Sensation intact. Gait not checked. Global weakness  noted. PSYCHIATRIC: The patient is alert and oriented x 3.  SKIN: No obvious rash, lesion, or ulcer.   DATA REVIEW:   CBC  Recent Labs Lab 11/07/16 0500  WBC 10.7*  HGB 13.4  HCT 40.1  PLT 350    Chemistries   Recent Labs Lab 11/03/16 1527  11/05/16 0723  11/07/16 0500  NA 133*  < > 135  < > 134*  K 3.5  < > 3.6  < > 4.0  CL 100*  < > 103  < > 97*  CO2 22  < > 24  < > 25  GLUCOSE 122*  < > 125*  < > 143*  BUN 20  < > 13  < > 22*  CREATININE 1.24  < > 0.99  < > 1.18  CALCIUM 8.4*  < > 8.3*  < > 8.9  MG  --   < > 2.0  --   --   AST 42*  --   --   --   --   ALT 38  --   --   --   --   ALKPHOS 150*  --   --   --   --  BILITOT 0.7  --   --   --   --   < > = values in this interval not displayed.   Microbiology Results  Results for orders placed or performed during the hospital encounter of 11/03/16  Culture, blood (routine x 2)     Status: None (Preliminary result)   Collection Time: 11/03/16  3:39 PM  Result Value Ref Range Status   Specimen Description BLOOD BLOOD RIGHT ARM  Final   Special Requests   Final    BOTTLES DRAWN AEROBIC AND ANAEROBIC Blood Culture results may not be optimal due to an excessive volume of blood received in culture bottles   Culture NO GROWTH 4 DAYS  Final   Report Status PENDING  Incomplete  Culture, blood (routine x 2)     Status: None (Preliminary result)   Collection Time: 11/03/16  3:44 PM  Result Value Ref Range Status   Specimen Description BLOOD BLOOD LEFT FOREARM  Final   Special Requests   Final    BOTTLES DRAWN AEROBIC AND ANAEROBIC Blood Culture adequate volume   Culture NO GROWTH 4 DAYS  Final   Report Status PENDING  Incomplete    RADIOLOGY:  No results found.   Management plans discussed with the patient, family and they are in agreement.  CODE STATUS:     Code Status Orders        Start     Ordered   11/03/16 1953  Full code  Continuous     11/03/16 1952    Code Status History    Date Active Date  Inactive Code Status Order ID Comments User Context   This patient has a current code status but no historical code status.      TOTAL TIME TAKING CARE OF THIS PATIENT: 38 minutes.    Gladstone Lighter M.D on 11/07/2016 at 9:18 AM  Between 7am to 6pm - Pager - [phone]  After 6pm go to [email or web address] - Proofreader  [facility]  Office  [phone]  CC: Primary care physician; Farley Ly, MD   Note: This dictation was prepared with Dragon dictation along with smaller phrase technology. Any transcriptional errors that result from this process are unintentional.

## 2016-11-07 NOTE — Care Management (Signed)
No discharge needs identified by members of the care team 

## 2016-11-07 NOTE — Progress Notes (Signed)
Progress Note  Patient Name: Gabriel Brewer Date of Encounter: 11/07/2016  Primary Cardiologist: [place] well this morning, slept well Now off oxygen, Reports he is ambulated without difficulty  Telemetry reviewed, maintaining normal sinus rhythm   Inpatient Medications    Scheduled Meds: . aspirin EC  81 mg Oral Daily  . atorvastatin  10 mg Oral QPM  . azithromycin  250 mg Oral Daily  . benazepril  40 mg Oral QPM  . budesonide (PULMICORT) nebulizer solution  0.5 mg Nebulization BID  . cholecalciferol  4,000 Units Oral Q7 days  . dextromethorphan  30 mg Oral BID   And  . guaiFENesin  600 mg Oral BID  . diltiazem  120 mg Oral Daily  . furosemide  20 mg Intravenous Q12H  . insulin aspart  0-5 Units Subcutaneous QHS  . insulin aspart  0-9 Units Subcutaneous TID WC  . ipratropium-albuterol  3 mL Nebulization Q6H  . loratadine  10 mg Oral Daily  . pantoprazole  40 mg Oral Daily  . sodium chloride flush  3 mL Intravenous Q12H     Vital Signs    Vitals:   11/07/16 0500 11/07/16 0548 11/07/16 0730 11/07/16 1240  BP:  (!) 116/57 122/65 109/61  Pulse:  87 87 (!) 108  Resp:   18 18  Temp:  97.8 F (36.6 C) 97.9 F (36.6 C)   TempSrc:  Oral Oral   SpO2:  92% 97% 92%  Weight: 158 lb 14.4 oz (72.1 kg)     Height:        Intake/Output Summary (Last 24 hours) at 11/07/16 1905 Last data filed at 11/07/16 1031  Gross per 24 hour  Intake              530 ml  Output              650 ml  Net             -120 ml   Filed Weights   11/05/16 0006 11/06/16 0500 11/07/16 0500  Weight: 166 lb 4.8 oz (75.4 kg) 162 lb (73.5 kg) 158 lb 14.4 oz (72.1 kg)    Telemetry    NSR - Personally Reviewed  ECG    n/a - Personally Reviewed  Physical Exam   GEN: No acute distress.   Neck: No JVD. Cardiac: RRR, no murmurs, rubs, or gallops.  Respiratory: Coarse breath sounds bilaterally.  GI: Soft, nontender, non-distended.   MS: No edema; No  deformity. Neuro:  Alert and oriented x 3; Nonfocal.  Psych: Normal affect.  Labs    Chemistry Recent Labs Lab 11/03/16 1527  11/05/16 0723 11/06/16 0239 11/07/16 0500  NA 133*  < > 135 134* 134*  K 3.5  < > 3.6 4.3 4.0  CL 100*  < > 103 99* 97*  CO2 22  < > 24 25 25   GLUCOSE 122*  < > 125* 147* 143*  BUN 20  < > 13 15 22*  CREATININE 1.24  < > 0.99 1.13 1.18  CALCIUM 8.4*  < > 8.3* 8.5* 8.9  PROT 7.1  --   --   --   --   ALBUMIN 3.2*  --   --   --   --   AST 42*  --   --   --   --   ALT 38  --   --   --   --   ALKPHOS 150*  --   --   --   --  BILITOT 0.7  --   --   --   --   GFRNONAA 55*  < > >60 >60 58*  GFRAA >60  < > >60 >60 >60  ANIONGAP 11  < > 8 10 12   < > = values in this interval not displayed.   Hematology  Recent Labs Lab 11/05/16 1920 11/06/16 0239 11/07/16 0500  WBC 10.9* 10.5 10.7*  RBC 4.41 4.61 4.99  HGB 11.9* 12.4* 13.4  HCT 35.0* 37.0* 40.1  MCV 79.4* 80.3 80.3  MCH 27.0 27.0 26.8  MCHC 34.1 33.6 33.3  RDW 16.1* 16.0* 16.0*  PLT 301 291 350    Cardiac Enzymes  Recent Labs Lab 11/03/16 1527 11/03/16 2000 11/04/16 0015 11/04/16 0353  TROPONINI 0.05* 0.05* 0.04* 0.05*   No results for input(s): TROPIPOC in the last 168 hours.   BNP  Recent Labs Lab 11/03/16 2000  BNP 70.0     DDimer No results for input(s): DDIMER in the last 168 hours.   Radiology    Ct Angio Chest Pe W Or Wo Contrast  Result Date: 11/05/2016 IMPRESSION: Bilateral lower lobe airspace opacities concerning for pneumonia. Trace bilateral effusions. Borderline size right scratched at borderline sized hilar and mediastinal lymph nodes, likely reactive. Small hiatal hernia. Ectasia of the proximal descending thoracic aorta, 3.7 cm maximally. No evidence of pulmonary embolus. Aortic Atherosclerosis (ICD10-I70.0) and Emphysema (ICD10-J43.9). Electronically Signed   By: Rolm Baptise M.D.   On: 11/05/2016 10:22   Dg Chest Port 1 View  Result Date:  11/05/2016 IMPRESSION: Improved aeration left retrocardiac region. Electronically Signed   By: Inez Catalina M.D.   On: 11/05/2016 07:49    Cardiac Studies   TTE pending.  TTE 08/05/2016: IMPRESSIONS Normal left ventricular ejection fraction estimated at 60-65%. Abnormal relaxation filling pattern of the left ventricle for age (stage 1 diastolic dysfunction). Mild mitral regurgitation with eccentric jet. Mild tricuspid regurgitation.  Patient Profile     76 y.o. male with history of non-rheumatic mitral regurgitation with history of mitral valve bacterial endocarditis in 03/2015 in the setting of MRSA bacteremia/sepsis, diastolic dysfunction, priro stroke involving the right eye x 2, COPD, DM2, HTN, and HLD who is being seen today for the evaluation of new onset Afib with RVR at the request of Dr. Jannifer Franklin, MD.  Assessment & Plan      1) atrial fibrillation with RVR Maintaining normal sinus rhythm Converted after receiving diltiazem infusion Because of his atrial fibrillation possibly secondary to COPD exacerbation Now on room air High risk of recurrent atrial fi2) brillation, elevated CHADS VASC  He is willing to take Xarelto 20 mg daily  Stop aspirin Continue diltiazem  2) acute respiratory distress COPD exacerbation on antibiotics, nebulizers Much better, now on nasal cannula oxygen Will need to be seen in follow-up  Greater than 50% was spent in counseling and coordination of care with patient Total encounter time 25 minutes or more   Signed: Esmond Plants  M.D., Ph.D. [facility]

## 2016-11-07 NOTE — Discharge Instructions (Signed)
Information on my medicine - XARELTO (Rivaroxaban)  This medication education was reviewed with me or my healthcare representative as part of my discharge preparation.  The pharmacist that spoke with me during my hospital stay was:  Candelaria Stagers, [facility]  Why was Xarelto prescribed for you? Xarelto was prescribed for you to reduce the risk of a blood clot forming that can cause a stroke if you have a medical condition called atrial fibrillation (a type of irregular heartbeat).  What do you need to know about xarelto ? Take your Xarelto ONCE DAILY at the same time every day with your evening meal. If you have difficulty swallowing the tablet whole, you may crush it and mix in applesauce just prior to taking your dose.  Take Xarelto exactly as prescribed by your doctor and DO NOT stop taking Xarelto without talking to the doctor who prescribed the medication.  Stopping without other stroke prevention medication to take the place of Xarelto may increase your risk of developing a clot that causes a stroke.  Refill your prescription before you run out.  After discharge, you should have regular check-up appointments with your healthcare provider that is prescribing your Xarelto.  In the future your dose may need to be changed if your kidney function or weight changes by a significant amount.  What do you do if you miss a dose? If you are taking Xarelto ONCE DAILY and you miss a dose, take it as soon as you remember on the same day then continue your regularly scheduled once daily regimen the next day. Do not take two doses of Xarelto at the same time or on the same day.   Important Safety Information A possible side effect of Xarelto is bleeding. You should call your healthcare provider right away if you experience any of the following: ? Bleeding from an injury or your nose that does not stop. ? Unusual colored urine (red or dark brown) or unusual colored stools (red or black). ? Unusual  bruising for unknown reasons. ? A serious fall or if you hit your head (even if there is no bleeding).  Some medicines may interact with Xarelto and might increase your risk of bleeding while on Xarelto. To help avoid this, consult your healthcare provider or pharmacist prior to using any new prescription or non-prescription medications, including herbals, vitamins, non-steroidal anti-inflammatory drugs (NSAIDs) and supplements.  This website has more information on Xarelto: [email or web address]

## 2016-11-08 LAB — CULTURE, BLOOD (ROUTINE X 2)
Culture: NO GROWTH
Culture: NO GROWTH
Special Requests: ADEQUATE

## 2016-11-12 DIAGNOSIS — J181 Lobar pneumonia, unspecified organism: Secondary | ICD-10-CM | POA: Diagnosis not present

## 2016-11-12 DIAGNOSIS — I480 Paroxysmal atrial fibrillation: Secondary | ICD-10-CM | POA: Diagnosis not present

## 2016-11-12 DIAGNOSIS — E876 Hypokalemia: Secondary | ICD-10-CM | POA: Diagnosis not present

## 2016-11-12 DIAGNOSIS — I10 Essential (primary) hypertension: Secondary | ICD-10-CM | POA: Diagnosis not present

## 2016-11-14 DIAGNOSIS — R918 Other nonspecific abnormal finding of lung field: Secondary | ICD-10-CM | POA: Diagnosis not present

## 2016-11-14 DIAGNOSIS — J9811 Atelectasis: Secondary | ICD-10-CM | POA: Diagnosis not present

## 2016-11-14 DIAGNOSIS — J181 Lobar pneumonia, unspecified organism: Secondary | ICD-10-CM | POA: Diagnosis not present

## 2016-11-14 DIAGNOSIS — H34811 Central retinal vein occlusion, right eye, with macular edema: Secondary | ICD-10-CM | POA: Diagnosis not present

## 2016-11-14 DIAGNOSIS — H348322 Tributary (branch) retinal vein occlusion, left eye, stable: Secondary | ICD-10-CM | POA: Diagnosis not present

## 2016-11-14 DIAGNOSIS — Z23 Encounter for immunization: Secondary | ICD-10-CM | POA: Diagnosis not present

## 2016-11-29 DIAGNOSIS — J189 Pneumonia, unspecified organism: Secondary | ICD-10-CM | POA: Diagnosis not present

## 2016-12-12 DIAGNOSIS — H34811 Central retinal vein occlusion, right eye, with macular edema: Secondary | ICD-10-CM | POA: Diagnosis not present

## 2016-12-12 DIAGNOSIS — H348322 Tributary (branch) retinal vein occlusion, left eye, stable: Secondary | ICD-10-CM | POA: Diagnosis not present

## 2016-12-30 DIAGNOSIS — M6080 Other myositis, unspecified site: Secondary | ICD-10-CM | POA: Diagnosis not present

## 2017-01-09 ENCOUNTER — Emergency Department: Payer: Medicare Other

## 2017-01-09 ENCOUNTER — Emergency Department
Admission: EM | Admit: 2017-01-09 | Discharge: 2017-01-09 | Disposition: A | Discharge status: Home / Self Care | Payer: Medicare Other | Attending: Emergency Medicine | Admitting: Emergency Medicine

## 2017-01-09 ENCOUNTER — Other Ambulatory Visit: Payer: Self-pay

## 2017-01-09 DIAGNOSIS — I739 Peripheral vascular disease, unspecified: Secondary | ICD-10-CM | POA: Diagnosis not present

## 2017-01-09 DIAGNOSIS — I10 Essential (primary) hypertension: Secondary | ICD-10-CM | POA: Diagnosis not present

## 2017-01-09 DIAGNOSIS — E785 Hyperlipidemia, unspecified: Secondary | ICD-10-CM | POA: Insufficient documentation

## 2017-01-09 DIAGNOSIS — Z79899 Other long term (current) drug therapy: Secondary | ICD-10-CM | POA: Diagnosis not present

## 2017-01-09 DIAGNOSIS — Z87891 Personal history of nicotine dependence: Secondary | ICD-10-CM | POA: Diagnosis not present

## 2017-01-09 DIAGNOSIS — M7989 Other specified soft tissue disorders: Secondary | ICD-10-CM | POA: Diagnosis not present

## 2017-01-09 DIAGNOSIS — Z7984 Long term (current) use of oral hypoglycemic drugs: Secondary | ICD-10-CM | POA: Diagnosis not present

## 2017-01-09 DIAGNOSIS — E119 Type 2 diabetes mellitus without complications: Secondary | ICD-10-CM | POA: Insufficient documentation

## 2017-01-09 DIAGNOSIS — M79604 Pain in right leg: Secondary | ICD-10-CM | POA: Diagnosis not present

## 2017-01-09 DIAGNOSIS — I70211 Atherosclerosis of native arteries of extremities with intermittent claudication, right leg: Secondary | ICD-10-CM | POA: Insufficient documentation

## 2017-01-09 LAB — CBC WITH DIFFERENTIAL/PLATELET
Basophils Absolute: 0.1 10*3/uL (ref 0–0.1)
Basophils Relative: 1 %
EOS ABS: 0.1 10*3/uL (ref 0–0.7)
EOS PCT: 2 %
HCT: 36.8 % — ABNORMAL LOW (ref 40.0–52.0)
HEMOGLOBIN: 12.3 g/dL — AB (ref 13.0–18.0)
LYMPHS ABS: 1.7 10*3/uL (ref 1.0–3.6)
Lymphocytes Relative: 32 %
MCH: 26.7 pg (ref 26.0–34.0)
MCHC: 33.4 g/dL (ref 32.0–36.0)
MCV: 79.8 fL — ABNORMAL LOW (ref 80.0–100.0)
MONOS PCT: 11 %
Monocytes Absolute: 0.6 10*3/uL (ref 0.2–1.0)
NEUTROS PCT: 54 %
Neutro Abs: 2.9 10*3/uL (ref 1.4–6.5)
Platelets: 221 10*3/uL (ref 150–440)
RBC: 4.61 MIL/uL (ref 4.40–5.90)
RDW: 15.1 % — ABNORMAL HIGH (ref 11.5–14.5)
WBC: 5.4 10*3/uL (ref 3.8–10.6)

## 2017-01-09 LAB — COMPREHENSIVE METABOLIC PANEL
ALT: 12 U/L — AB (ref 17–63)
AST: 22 U/L (ref 15–41)
Albumin: 4.1 g/dL (ref 3.5–5.0)
Alkaline Phosphatase: 77 U/L (ref 38–126)
Anion gap: 6 (ref 5–15)
BUN: 22 mg/dL — AB (ref 6–20)
CHLORIDE: 106 mmol/L (ref 101–111)
CO2: 23 mmol/L (ref 22–32)
CREATININE: 1.11 mg/dL (ref 0.61–1.24)
Calcium: 9 mg/dL (ref 8.9–10.3)
GFR calc Af Amer: >60 mL/min (ref >60)
GFR calc non Af Amer: >60 mL/min (ref >60)
Glucose, Bld: 105 mg/dL — ABNORMAL HIGH (ref 65–99)
Potassium: 3.8 mmol/L (ref 3.5–5.1)
SODIUM: 135 mmol/L (ref 135–145)
Total Bilirubin: 1 mg/dL (ref 0.3–1.2)
Total Protein: 6.8 g/dL (ref 6.5–8.1)

## 2017-01-09 MED ORDER — TRAMADOL HCL 50 MG PO TABS: 50.0000 mg | ORAL_TABLET | Freq: Four times a day (QID) | ORAL | 0 refills | Status: DC | PRN

## 2017-01-09 NOTE — ED Triage Notes (Signed)
Pt c/o pain in the right leg from the knee to the ankle with intermittent swelling, worse with movement, for the past 10 days. Denies injury. States he went to the urgent care last week and was given IBU states it was better while taking IBU but pain came right back after finishing the prescription.

## 2017-01-09 NOTE — Discharge Instructions (Signed)
Call and make an appointment with Dr. Gale Journey for follow-up. Continue regular medication as prescribed by your doctor. Tramadol 1 tablet every 6 hours if needed for pain.

## 2017-01-09 NOTE — ED Provider Notes (Signed)
[facility] Emergency Department Provider Note  ____________________________________________   First MD Initiated Contact with Patient 01/09/17 1035     (approximate)  I have reviewed the triage vital signs and the nursing notes.   HISTORY  Chief Complaint Claudication   HPI Gabriel Brewer is a 76 y.o. male is here complaining of right leg pain. Patient states that his pain is localized from his calf down to his ankle. Patient states it is worse with walking and is reduced are completely resolves with rest. He denies any difficulties in the past. He denies any injury to his leg. He states the only traveling he has done has been an hourfrom home. He currently continues to take xarelto since his hospitalization for pneumonia this year. Patient records indicate that patient developed atrial fibrillation while hospitalized. He has followed up with his PCP in [place] and has continued this medication.he denies any previous DVTs.   Past Medical History:  Diagnosis Date  . Cancer (Gabriel Brewer) 2017   skin   . Diabetes mellitus without complication ([place] Brew[place])   . GERD (gastroesophageal reflux disease)   . Hyperlipidemia   . Hypertension     Patient Active Problem List   Diagnosis Date Noted  . Pneumonia 11/03/2016    Past Surgical History:  Procedure Laterality Date  . TONSILLECTOMY      Prior to Admission medications   Medication Sig Start Date End Date Taking? Authorizing Provider  atorvastatin (LIPITOR) 10 MG tablet Take 1 tablet by mouth daily. 10/24/14   [provider]  benazepril (LOTENSIN) 40 MG tablet Take 1 tablet (40 mg total) by mouth every evening. 11/07/16   Gladstone Lighter, MD  Cholecalciferol 4000 units CAPS Take 1 capsule by mouth every 7 (seven) days.    [provider]  diltiazem (CARDIZEM CD) 180 MG 24 hr capsule Take 1 capsule (180 mg total) by mouth daily. 11/07/16   Gladstone Lighter, MD  metFORMIN  (GLUCOPHAGE) 500 MG tablet Take 500 mg by mouth 2 (two) times daily.    [provider]  omeprazole (PRILOSEC) 40 MG capsule Take 1 capsule by mouth daily. 09/21/14   [provider]  rivaroxaban (XARELTO) 20 MG TABS tablet Take 1 tablet (20 mg total) by mouth daily with supper. 11/07/16   Gladstone Lighter, MD  traMADol (ULTRAM) 50 MG tablet Take 1 tablet (50 mg total) by mouth every 6 (six) hours as needed. 01/09/17   [name], PA-C    Allergies Patient has no known allergies.  Family History  Problem Relation Age of Onset  . Cerebral aneurysm Mother   . Heart disease Father   . COPD Father     Social History Social History   Tobacco Use  . Smoking status: Former Smoker    Packs/day: 0.50    Years: 2.00    Pack years: 1.00    Types: Cigarettes    Last attempt to quit: 11/05/2010    Years since quitting: 6.1  . Smokeless tobacco: Never Used  Substance Use Topics  . Alcohol use: No  . Drug use: No    Review of Systems Constitutional: No fever/chills Cardiovascular: Denies chest pain. Respiratory: Denies shortness of breath. Gastrointestinal: No abdominal pain.  No nausea, no vomiting.   Musculoskeletal: positive right leg pain Skin: Negative for rash. No trauma. Neurological: Negative for headaches, focal weakness or numbness. ____________________________________________   PHYSICAL EXAM:  VITAL SIGNS: ED Triage Vitals  Enc Vitals Group  BP 01/09/17 1031 (!) 152/85     Pulse Rate 01/09/17 1031 83     Resp 01/09/17 1031 17     Temp 01/09/17 1031 (!) 97.5 F (36.4 C)     Temp Source 01/09/17 1031 Oral     SpO2 01/09/17 1031 99 %     Weight 01/09/17 1031 165 lb (74.8 kg)     Height 01/09/17 1031 5\' 11"  (1.803 m)     Head Circumference --      Peak Flow --      Pain Score 01/09/17 1030 6     Pain Loc --      Pain Edu? --      Excl. in [place]? --     Constitutional: Alert and oriented. Well appearing and in no acute distress. Eyes:  Conjunctivae are normal.  Head: Atraumatic. Neck: No stridor.   Cardiovascular: Normal rate, regular rhythm. Grossly normal heart sounds.  Good peripheral circulation. Respiratory: Normal respiratory effort.  No retractions. Lungs CTAB. Gastrointestinal: Soft and nontender. No distention.  Musculoskeletal: examination of the right leg in comparison with left leg does not show any gross deformity.  Skin is warm and dry. There is no discoloration. There is no point tenderness on palpation. Homans sign is negative. Motor sensory function intact. Capillary refill is less than 3 seconds. Range of motion is without restriction. There is no soft tissue edema present. Neurologic:  Normal speech and language. No gross focal neurologic deficits are appreciated. No gait instability. Skin:  Skin is warm, dry and intact. As noted above. Psychiatric: Mood and affect are normal. Speech and behavior are normal.  ____________________________________________   LABS (all labs ordered are listed, but only abnormal results are displayed)  Labs Reviewed  COMPREHENSIVE METABOLIC PANEL - Abnormal; Notable for the following components:      Result Value   Glucose, Bld 105 (*)    BUN 22 (*)    ALT 12 (*)    All other components within normal limits  CBC WITH DIFFERENTIAL/PLATELET - Abnormal; Notable for the following components:   Hemoglobin 12.3 (*)    HCT 36.8 (*)    MCV 79.8 (*)    RDW 15.1 (*)    All other components within normal limits   ____________________________________________   RADIOLOGY  US Venous Img Lower Unilateral Right  Result Date: 01/09/2017 CLINICAL DATA:  Right leg pain.  Swelling.  Symptoms for 10 days. EXAM: RIGHT LOWER EXTREMITY VENOUS DUPLEX ULTRASOUND TECHNIQUE: Doppler venous assessment of the right lower extremity deep venous system was performed, including characterization of spectral flow, compressibility, and phasicity. COMPARISON:  None. FINDINGS: There is complete  compressibility of the right common femoral, femoral, and popliteal veins. Doppler analysis demonstrates respiratory phasicity and augmentation of flow with calf compression. No obvious superficial vein or calf vein thrombosis. IMPRESSION: No evidence of right lower extremity DVT. Electronically Signed   By: Marybelle Killings M.D.   On: 01/09/2017 11:20    ____________________________________________   PROCEDURES  Procedure(s) performed: None  Procedures  Critical Care performed: No  ____________________________________________   INITIAL IMPRESSION / ASSESSMENT AND PLAN / ED COURSE Patient is continue his present medication and follow-up with his PCP. He was given a prescription for tramadol 50 mg 1 every 6 hours if needed for pain. We discussed his atrial fib which he did not understand on his last hospitalization. Patient will return to the emergency department if any severe worsening of his symptoms. He is given information about claudication to  read.  By history this is most likely what is causing his leg pain.  ____________________________________________   FINAL CLINICAL IMPRESSION(S) / ED DIAGNOSES  Final diagnoses:  Right leg claudication [facility])     ED Discharge Orders        Ordered    traMADol (ULTRAM) 50 MG tablet  Every 6 hours PRN     01/09/17 1140       Note:  This document was prepared using Dragon voice recognition software and may include unintentional dictation errors.    [name], PA-C 01/09/17 1522    Lisa Roca, MD 01/10/17 [phone]

## 2017-01-09 NOTE — ED Notes (Signed)
inSee triage note  States he developed pain to right leg  Pain goes from knee into ankle  Denies any injury  States pain is daily but changes in intensity  Also has had some intermittent swelling

## 2017-01-15 DIAGNOSIS — G629 Polyneuropathy, unspecified: Secondary | ICD-10-CM | POA: Diagnosis not present

## 2017-01-15 DIAGNOSIS — H7392 Unspecified disorder of tympanic membrane, left ear: Secondary | ICD-10-CM | POA: Diagnosis not present

## 2017-01-15 DIAGNOSIS — I480 Paroxysmal atrial fibrillation: Secondary | ICD-10-CM | POA: Diagnosis not present

## 2017-01-15 DIAGNOSIS — E782 Mixed hyperlipidemia: Secondary | ICD-10-CM | POA: Diagnosis not present

## 2017-01-15 DIAGNOSIS — R202 Paresthesia of skin: Secondary | ICD-10-CM | POA: Diagnosis not present

## 2017-01-15 DIAGNOSIS — I10 Essential (primary) hypertension: Secondary | ICD-10-CM | POA: Diagnosis not present

## 2017-01-15 DIAGNOSIS — R7309 Other abnormal glucose: Secondary | ICD-10-CM | POA: Diagnosis not present

## 2017-01-16 DIAGNOSIS — G629 Polyneuropathy, unspecified: Secondary | ICD-10-CM | POA: Diagnosis not present

## 2017-01-16 DIAGNOSIS — I480 Paroxysmal atrial fibrillation: Secondary | ICD-10-CM | POA: Diagnosis not present

## 2017-01-16 DIAGNOSIS — R202 Paresthesia of skin: Secondary | ICD-10-CM | POA: Diagnosis not present

## 2017-01-23 DIAGNOSIS — H348322 Tributary (branch) retinal vein occlusion, left eye, stable: Secondary | ICD-10-CM | POA: Diagnosis not present

## 2017-01-23 DIAGNOSIS — H34811 Central retinal vein occlusion, right eye, with macular edema: Secondary | ICD-10-CM | POA: Diagnosis not present

## 2017-01-27 DIAGNOSIS — L821 Other seborrheic keratosis: Secondary | ICD-10-CM | POA: Diagnosis not present

## 2017-01-27 DIAGNOSIS — D1801 Hemangioma of skin and subcutaneous tissue: Secondary | ICD-10-CM | POA: Diagnosis not present

## 2017-01-27 DIAGNOSIS — Z872 Personal history of diseases of the skin and subcutaneous tissue: Secondary | ICD-10-CM | POA: Diagnosis not present

## 2017-01-27 DIAGNOSIS — L570 Actinic keratosis: Secondary | ICD-10-CM | POA: Diagnosis not present

## 2017-01-27 DIAGNOSIS — X32XXXA Exposure to sunlight, initial encounter: Secondary | ICD-10-CM | POA: Diagnosis not present

## 2017-01-27 DIAGNOSIS — Z85828 Personal history of other malignant neoplasm of skin: Secondary | ICD-10-CM | POA: Diagnosis not present

## 2017-01-31 DIAGNOSIS — J340 Abscess, furuncle and carbuncle of nose: Secondary | ICD-10-CM | POA: Diagnosis not present

## 2017-01-31 DIAGNOSIS — H748X3 Other specified disorders of middle ear and mastoid, bilateral: Secondary | ICD-10-CM | POA: Diagnosis not present

## 2017-01-31 DIAGNOSIS — H6980 Other specified disorders of Eustachian tube, unspecified ear: Secondary | ICD-10-CM | POA: Diagnosis not present

## 2017-02-21 DIAGNOSIS — H6980 Other specified disorders of Eustachian tube, unspecified ear: Secondary | ICD-10-CM | POA: Diagnosis not present

## 2017-02-21 DIAGNOSIS — H748X2 Other specified disorders of left middle ear and mastoid: Secondary | ICD-10-CM | POA: Diagnosis not present

## 2017-03-13 DIAGNOSIS — H348322 Tributary (branch) retinal vein occlusion, left eye, stable: Secondary | ICD-10-CM | POA: Diagnosis not present

## 2017-03-13 DIAGNOSIS — H34811 Central retinal vein occlusion, right eye, with macular edema: Secondary | ICD-10-CM | POA: Diagnosis not present

## 2017-04-18 DIAGNOSIS — H748X2 Other specified disorders of left middle ear and mastoid: Secondary | ICD-10-CM | POA: Diagnosis not present

## 2017-04-18 DIAGNOSIS — R040 Epistaxis: Secondary | ICD-10-CM | POA: Diagnosis not present

## 2017-05-01 DIAGNOSIS — H34811 Central retinal vein occlusion, right eye, with macular edema: Secondary | ICD-10-CM | POA: Diagnosis not present

## 2017-05-01 DIAGNOSIS — H348322 Tributary (branch) retinal vein occlusion, left eye, stable: Secondary | ICD-10-CM | POA: Diagnosis not present

## 2017-05-12 DIAGNOSIS — J432 Centrilobular emphysema: Secondary | ICD-10-CM | POA: Diagnosis not present

## 2017-05-12 DIAGNOSIS — R918 Other nonspecific abnormal finding of lung field: Secondary | ICD-10-CM | POA: Diagnosis not present

## 2017-05-14 DIAGNOSIS — I480 Paroxysmal atrial fibrillation: Secondary | ICD-10-CM | POA: Diagnosis not present

## 2017-05-14 DIAGNOSIS — I10 Essential (primary) hypertension: Secondary | ICD-10-CM | POA: Diagnosis not present

## 2017-05-14 DIAGNOSIS — I058 Other rheumatic mitral valve diseases: Secondary | ICD-10-CM | POA: Diagnosis not present

## 2017-05-14 DIAGNOSIS — I340 Nonrheumatic mitral (valve) insufficiency: Secondary | ICD-10-CM | POA: Diagnosis not present

## 2017-06-26 DIAGNOSIS — H348322 Tributary (branch) retinal vein occlusion, left eye, stable: Secondary | ICD-10-CM | POA: Diagnosis not present

## 2017-06-26 DIAGNOSIS — H34811 Central retinal vein occlusion, right eye, with macular edema: Secondary | ICD-10-CM | POA: Diagnosis not present

## 2017-07-10 DIAGNOSIS — H524 Presbyopia: Secondary | ICD-10-CM | POA: Diagnosis not present

## 2017-07-10 DIAGNOSIS — E113293 Type 2 diabetes mellitus with mild nonproliferative diabetic retinopathy without macular edema, bilateral: Secondary | ICD-10-CM | POA: Diagnosis not present

## 2017-07-10 DIAGNOSIS — H34833 Tributary (branch) retinal vein occlusion, bilateral, with macular edema: Secondary | ICD-10-CM | POA: Diagnosis not present

## 2017-07-10 DIAGNOSIS — H5203 Hypermetropia, bilateral: Secondary | ICD-10-CM | POA: Diagnosis not present

## 2017-07-10 DIAGNOSIS — H2513 Age-related nuclear cataract, bilateral: Secondary | ICD-10-CM | POA: Diagnosis not present

## 2017-07-10 DIAGNOSIS — H52223 Regular astigmatism, bilateral: Secondary | ICD-10-CM | POA: Diagnosis not present

## 2017-08-15 DIAGNOSIS — H348322 Tributary (branch) retinal vein occlusion, left eye, stable: Secondary | ICD-10-CM | POA: Diagnosis not present

## 2017-08-15 DIAGNOSIS — H34811 Central retinal vein occlusion, right eye, with macular edema: Secondary | ICD-10-CM | POA: Diagnosis not present

## 2017-08-20 DIAGNOSIS — R7309 Other abnormal glucose: Secondary | ICD-10-CM | POA: Diagnosis not present

## 2017-08-20 DIAGNOSIS — R7303 Prediabetes: Secondary | ICD-10-CM | POA: Diagnosis not present

## 2017-08-20 DIAGNOSIS — E782 Mixed hyperlipidemia: Secondary | ICD-10-CM | POA: Diagnosis not present

## 2017-08-20 DIAGNOSIS — I10 Essential (primary) hypertension: Secondary | ICD-10-CM | POA: Diagnosis not present

## 2017-08-20 DIAGNOSIS — I480 Paroxysmal atrial fibrillation: Secondary | ICD-10-CM | POA: Diagnosis not present

## 2017-09-29 DIAGNOSIS — X32XXXA Exposure to sunlight, initial encounter: Secondary | ICD-10-CM | POA: Diagnosis not present

## 2017-09-29 DIAGNOSIS — D485 Neoplasm of uncertain behavior of skin: Secondary | ICD-10-CM | POA: Diagnosis not present

## 2017-09-29 DIAGNOSIS — L859 Epidermal thickening, unspecified: Secondary | ICD-10-CM | POA: Diagnosis not present

## 2017-09-29 DIAGNOSIS — Z08 Encounter for follow-up examination after completed treatment for malignant neoplasm: Secondary | ICD-10-CM | POA: Diagnosis not present

## 2017-09-29 DIAGNOSIS — Z85828 Personal history of other malignant neoplasm of skin: Secondary | ICD-10-CM | POA: Diagnosis not present

## 2017-09-29 DIAGNOSIS — L821 Other seborrheic keratosis: Secondary | ICD-10-CM | POA: Diagnosis not present

## 2017-09-29 DIAGNOSIS — D0421 Carcinoma in situ of skin of right ear and external auricular canal: Secondary | ICD-10-CM | POA: Diagnosis not present

## 2017-09-29 DIAGNOSIS — Z872 Personal history of diseases of the skin and subcutaneous tissue: Secondary | ICD-10-CM | POA: Diagnosis not present

## 2017-09-29 DIAGNOSIS — L570 Actinic keratosis: Secondary | ICD-10-CM | POA: Diagnosis not present

## 2017-10-03 DIAGNOSIS — H34811 Central retinal vein occlusion, right eye, with macular edema: Secondary | ICD-10-CM | POA: Diagnosis not present

## 2017-10-03 DIAGNOSIS — H348322 Tributary (branch) retinal vein occlusion, left eye, stable: Secondary | ICD-10-CM | POA: Diagnosis not present

## 2017-10-14 ENCOUNTER — Telehealth: Payer: Self-pay

## 2017-10-14 DIAGNOSIS — Z23 Encounter for immunization: Secondary | ICD-10-CM | POA: Diagnosis not present

## 2017-10-14 NOTE — Telephone Encounter (Signed)
PLEASE NOTE: All timestamps contained within this report are represented as [place] Standard Time. CONFIDENTIALTY NOTICE: This fax transmission is intended only for the addressee. It contains information that is legally privileged, confidential or otherwise protected from use or disclosure. If you are not the intended recipient, you are strictly prohibited from reviewing, disclosing, copying using or disseminating any of this information or taking any action in reliance on or regarding this information. If you have received this fax in error, please notify us immediately by telephone so that we can arrange for its return to [place]. Phone: [phone], Toll-Free: [phone], Fax: [phone] Page: 1 of 1 Call Id: [id number] [place] Night - Client Nonclinical Telephone Record [place] Night - Client Client Site [place] Physician Alma Friendly - NP Contact Type Call Who Is Calling Patient / Member / Family / Caregiver Caller Name Gabriel Brewer Caller Phone Number [phone] Patient Name Gabriel Brewer Patient DOB 1940/09/06 Call Type Message Only Information Provided Reason for Call Request to Schedule Office Appointment Initial Comment Caller states husband is wanting to sign up as a new PT, and asks for a call back from the office. Additional Comment Provided information for a call back from the office. Call Closed By: Rosana Fret Transaction Date/Time: 10/14/2017 10:12:52 AM (ET)

## 2017-10-15 NOTE — Telephone Encounter (Signed)
Appt set up with Tor Netters, NP.

## 2017-10-29 DIAGNOSIS — D0439 Carcinoma in situ of skin of other parts of face: Secondary | ICD-10-CM | POA: Diagnosis not present

## 2017-11-01 ENCOUNTER — Emergency Department
Admission: EM | Admit: 2017-11-01 | Discharge: 2017-11-02 | Disposition: A | Discharge status: Home / Self Care | Payer: Medicare Other | Attending: Emergency Medicine | Admitting: Emergency Medicine

## 2017-11-01 ENCOUNTER — Other Ambulatory Visit: Payer: Self-pay

## 2017-11-01 ENCOUNTER — Encounter: Payer: Self-pay | Admitting: Emergency Medicine

## 2017-11-01 DIAGNOSIS — T8131XA Disruption of external operation (surgical) wound, not elsewhere classified, initial encounter: Secondary | ICD-10-CM | POA: Diagnosis not present

## 2017-11-01 DIAGNOSIS — S0180XA Unspecified open wound of other part of head, initial encounter: Secondary | ICD-10-CM | POA: Diagnosis not present

## 2017-11-01 DIAGNOSIS — I10 Essential (primary) hypertension: Secondary | ICD-10-CM | POA: Insufficient documentation

## 2017-11-01 DIAGNOSIS — Z7984 Long term (current) use of oral hypoglycemic drugs: Secondary | ICD-10-CM | POA: Diagnosis not present

## 2017-11-01 DIAGNOSIS — Z87891 Personal history of nicotine dependence: Secondary | ICD-10-CM | POA: Diagnosis not present

## 2017-11-01 DIAGNOSIS — T148XXA Other injury of unspecified body region, initial encounter: Secondary | ICD-10-CM

## 2017-11-01 DIAGNOSIS — Z5189 Encounter for other specified aftercare: Secondary | ICD-10-CM

## 2017-11-01 DIAGNOSIS — Z85828 Personal history of other malignant neoplasm of skin: Secondary | ICD-10-CM | POA: Diagnosis not present

## 2017-11-01 DIAGNOSIS — Z79899 Other long term (current) drug therapy: Secondary | ICD-10-CM | POA: Insufficient documentation

## 2017-11-01 DIAGNOSIS — Y69 Unspecified misadventure during surgical and medical care: Secondary | ICD-10-CM | POA: Insufficient documentation

## 2017-11-01 DIAGNOSIS — E119 Type 2 diabetes mellitus without complications: Secondary | ICD-10-CM | POA: Diagnosis not present

## 2017-11-01 MED ORDER — TRANEXAMIC ACID 1000 MG/10ML IV SOLN
500.0000 mg | Freq: Once | INTRAVENOUS | Status: AC
  Administered 2017-11-02: 500 mg via TOPICAL
  Filled 2017-11-01 (×2): qty 10

## 2017-11-01 MED ORDER — LIDOCAINE-EPINEPHRINE-TETRACAINE (LET) SOLUTION
3.0000 mL | Freq: Once | NASAL | Status: AC
  Administered 2017-11-01: 21:00:00 3 mL via TOPICAL
  Filled 2017-11-01: qty 3

## 2017-11-01 NOTE — ED Notes (Signed)
Pt  Noticed  Bleeding from suture   Line   Today in restaurant  Sutures  Placed several days  Ago below r ear

## 2017-11-01 NOTE — ED Triage Notes (Addendum)
Pt ambulatory to triage with no difficulty. Pt reports he had a skin cancer removed from the right side of his face on Wednesday 10/29/17. Pt reports this evening while eating dinner he noticed blood oozing out from under the dressing. No bleeding at this time. Original dressing remains in place. Pt is on xarelto.

## 2017-11-01 NOTE — ED Provider Notes (Signed)
[facility] Emergency Department Provider Note  ____________________________________________  Time seen: Approximately 9:08 PM  I have reviewed the triage vital signs and the nursing notes.   HISTORY  Chief Complaint Wound Check    HPI Michaeal Brewer is a 77 y.o. male that presents emergency department for evaluation of blood oozing from his suture site tonight at dinner.  Patient had sutures placed to right side of face after having a cancerous lesion removed on Wednesday.  Sutures are absorbable.  Patient takes Xarelto.    Past Medical History:  Diagnosis Date  . Cancer ([place]) 2017   skin   . Diabetes mellitus without complication ([place])   . GERD (gastroesophageal reflux disease)   . Hyperlipidemia   . Hypertension     Patient Active Problem List   Diagnosis Date Noted  . Pneumonia 11/03/2016    Past Surgical History:  Procedure Laterality Date  . TONSILLECTOMY      Prior to Admission medications   Medication Sig Start Date End Date Taking? Authorizing Provider  atorvastatin (LIPITOR) 10 MG tablet Take 1 tablet by mouth daily. 10/24/14   [provider]  benazepril (LOTENSIN) 40 MG tablet Take 1 tablet (40 mg total) by mouth every evening. 11/07/16   Gladstone Lighter, MD  Cholecalciferol 4000 units CAPS Take 1 capsule by mouth every 7 (seven) days.    [provider]  diltiazem (CARDIZEM CD) 180 MG 24 hr capsule Take 1 capsule (180 mg total) by mouth daily. 11/07/16   Gladstone Lighter, MD  metFORMIN (GLUCOPHAGE) 500 MG tablet Take 500 mg by mouth 2 (two) times daily.    [provider]  omeprazole (PRILOSEC) 40 MG capsule Take 1 capsule by mouth daily. 09/21/14   [provider]  rivaroxaban (XARELTO) 20 MG TABS tablet Take 1 tablet (20 mg total) by mouth daily with supper. 11/07/16   Gladstone Lighter, MD  traMADol (ULTRAM) 50 MG tablet Take 1 tablet (50 mg total) by mouth every 6 (six) hours as needed.  01/09/17   [name], PA-C    Allergies Patient has no known allergies.  Family History  Problem Relation Age of Onset  . Cerebral aneurysm Mother   . Heart disease Father   . COPD Father     Social History Social History   Tobacco Use  . Smoking status: Former Smoker    Packs/day: 0.50    Years: 2.00    Pack years: 1.00    Types: Cigarettes    Last attempt to quit: 11/05/2010    Years since quitting: 6.9  . Smokeless tobacco: Never Used  Substance Use Topics  . Alcohol use: No  . Drug use: No     Review of Systems  Constitutional: No fever/chills Gastrointestinal: No nausea, no vomiting.  Musculoskeletal: Negative for musculoskeletal pain. Skin: Negative for  ecchymosis.   ____________________________________________   PHYSICAL EXAM:  VITAL SIGNS: ED Triage Vitals  Enc Vitals Group     BP 11/01/17 1942 (!) 161/66     Pulse Rate 11/01/17 1942 90     Resp 11/01/17 1942 18     Temp 11/01/17 1942 97.6 F (36.4 C)     Temp Source 11/01/17 1942 Oral     SpO2 11/01/17 1942 100 %     Weight 11/01/17 1943 173 lb (78.5 kg)     Height 11/01/17 1943 5' 11.5" (1.816 m)     Head Circumference --      Peak Flow --  Pain Score 11/01/17 1943 0     Pain Loc --      Pain Edu? --      Excl. in [place]? --      Constitutional: Alert and oriented. Well appearing and in no acute distress. Eyes: Conjunctivae are normal. PERRL. EOMI. Head: Atraumatic. ENT:      Ears:      Nose: No congestion/rhinnorhea.      Mouth/Throat: Mucous membranes are moist.  Neck: No stridor.  Cardiovascular: Normal rate, regular rhythm.  Good peripheral circulation. Respiratory: Normal respiratory effort without tachypnea or retractions. Lungs CTAB. Good air entry to the bases with no decreased or absent breath sounds. Musculoskeletal: Full range of motion to all extremities. No gross deformities appreciated. Neurologic:  Normal speech and language. No gross focal neurologic  deficits are appreciated.  Skin:  Skin is warm, dry. 4cm incision to right face with sutures in place. No purulent or serous drainage. Minimal oozing blood to top of incision. No surrounding erythema.  Psychiatric: Mood and affect are normal. Speech and behavior are normal. Patient exhibits appropriate insight and judgement.   ____________________________________________   LABS (all labs ordered are listed, but only abnormal results are displayed)  Labs Reviewed - No data to display ____________________________________________  EKG   ____________________________________________  RADIOLOGY   No results found.  ____________________________________________    PROCEDURES  Procedure(s) performed:    Procedures    Medications  tranexamic acid (CYKLOKAPRON) injection 500 mg (has no administration in time range)  lidocaine-EPINEPHrine-tetracaine (LET) solution (3 mLs Topical Given 11/01/17 2105)     ____________________________________________   INITIAL IMPRESSION / ASSESSMENT AND PLAN / ED COURSE  Pertinent labs & imaging results that were available during my care of the patient were reviewed by me and considered in my medical decision making (see chart for details).  Review of the [place] CSRS was performed in accordance of the [place] prior to dispensing any controlled drugs.     Patient presented to the emergency department for evaluation of blood oozing to top of surgical incision from Wednesday.  Very minimal amount of blood is currently oozing.  Cessation of bleeding was unsuccessful with LET, Tranexamic acid, Surgicel, Steri-Strips, pressure.  Bleeding would stop but very slowly drip again after several minutes. Bleeding ceased with Dermabond.  No indication of infection.  Patient is to follow up with PCP.  As directed. Patient is given ED precautions to return to the ED for any worsening or new symptoms.     ____________________________________________  FINAL  CLINICAL IMPRESSION(S) / ED DIAGNOSES  Final diagnoses:  Visit for wound check  Bleeding from wound      NEW MEDICATIONS STARTED DURING THIS VISIT:  ED Discharge Orders    None          This chart was dictated using voice recognition software/Dragon. Despite best efforts to proofread, errors can occur which can change the meaning. Any change was purely unintentional.    Laban Emperor, PA-C 11/02/17 0024    Lavonia Drafts, MD 11/02/17 [phone]

## 2017-11-02 DIAGNOSIS — T8131XA Disruption of external operation (surgical) wound, not elsewhere classified, initial encounter: Secondary | ICD-10-CM | POA: Diagnosis not present

## 2017-11-02 NOTE — ED Notes (Signed)
Patient declined discharge vital signs. 

## 2017-11-12 ENCOUNTER — Ambulatory Visit (INDEPENDENT_AMBULATORY_CARE_PROVIDER_SITE_OTHER): Payer: Medicare Other | Admitting: Family Medicine

## 2017-11-12 ENCOUNTER — Telehealth: Payer: Self-pay | Admitting: Radiology

## 2017-11-12 ENCOUNTER — Encounter: Payer: Self-pay | Admitting: Family Medicine

## 2017-11-12 VITALS — BP 128/60 | HR 87 | Temp 97.6°F | Ht 71.5 in | Wt 173.8 lb

## 2017-11-12 DIAGNOSIS — R5383 Other fatigue: Secondary | ICD-10-CM

## 2017-11-12 DIAGNOSIS — R195 Other fecal abnormalities: Secondary | ICD-10-CM | POA: Diagnosis not present

## 2017-11-12 DIAGNOSIS — D649 Anemia, unspecified: Secondary | ICD-10-CM | POA: Diagnosis not present

## 2017-11-12 DIAGNOSIS — Z7901 Long term (current) use of anticoagulants: Secondary | ICD-10-CM

## 2017-11-12 DIAGNOSIS — Z8042 Family history of malignant neoplasm of prostate: Secondary | ICD-10-CM | POA: Insufficient documentation

## 2017-11-12 DIAGNOSIS — E559 Vitamin D deficiency, unspecified: Secondary | ICD-10-CM | POA: Insufficient documentation

## 2017-11-12 DIAGNOSIS — M545 Low back pain, unspecified: Secondary | ICD-10-CM | POA: Insufficient documentation

## 2017-11-12 DIAGNOSIS — E118 Type 2 diabetes mellitus with unspecified complications: Secondary | ICD-10-CM | POA: Diagnosis not present

## 2017-11-12 DIAGNOSIS — R972 Elevated prostate specific antigen [PSA]: Secondary | ICD-10-CM | POA: Insufficient documentation

## 2017-11-12 DIAGNOSIS — K219 Gastro-esophageal reflux disease without esophagitis: Secondary | ICD-10-CM | POA: Insufficient documentation

## 2017-11-12 LAB — COMPREHENSIVE METABOLIC PANEL
ALBUMIN: 4.5 g/dL (ref 3.5–5.2)
ALK PHOS: 55 U/L (ref 39–117)
ALT: 11 U/L (ref 0–53)
AST: 15 U/L (ref 0–37)
BUN: 22 mg/dL (ref 6–23)
CO2: 23 mEq/L (ref 19–32)
Calcium: 9.5 mg/dL (ref 8.4–10.5)
Chloride: 105 mEq/L (ref 96–112)
Creatinine, Ser: 1 mg/dL (ref 0.40–1.50)
GFR: 76.86 mL/min (ref >60.00)
GLUCOSE: 112 mg/dL — AB (ref 70–99)
POTASSIUM: 4.1 meq/L (ref 3.5–5.1)
Sodium: 139 mEq/L (ref 135–145)
TOTAL PROTEIN: 6.8 g/dL (ref 6.0–8.3)
Total Bilirubin: 0.5 mg/dL (ref 0.2–1.2)

## 2017-11-12 LAB — CBC WITH DIFFERENTIAL/PLATELET
BASOS PCT: 1.6 % (ref 0.0–3.0)
Basophils Absolute: 0.1 10*3/uL (ref 0.0–0.1)
EOS PCT: 1.5 % (ref 0.0–5.0)
Eosinophils Absolute: 0.1 10*3/uL (ref 0.0–0.7)
HCT: 23.1 % — CL (ref 39.0–52.0)
Hemoglobin: 7.2 g/dL — CL (ref 13.0–17.0)
LYMPHS ABS: 1.4 10*3/uL (ref 0.7–4.0)
LYMPHS PCT: 25.5 % (ref 12.0–46.0)
MCHC: 31 g/dL (ref 30.0–36.0)
MCV: 65.6 fl — AB (ref 78.0–100.0)
MONOS PCT: 11.1 % (ref 3.0–12.0)
Monocytes Absolute: 0.6 10*3/uL (ref 0.1–1.0)
Neutro Abs: 3.3 10*3/uL (ref 1.4–7.7)
Neutrophils Relative %: 60.3 % (ref 43.0–77.0)
Platelets: 305 10*3/uL (ref 150.0–400.0)
RBC: 3.52 Mil/uL — AB (ref 4.22–5.81)
RDW: 20.3 % — ABNORMAL HIGH (ref 11.5–15.5)
WBC: 5.5 10*3/uL (ref 4.0–10.5)

## 2017-11-12 LAB — HEMOGLOBIN A1C: Hgb A1c MFr Bld: 5.6 % (ref 4.6–6.5)

## 2017-11-12 LAB — TSH: TSH: 2.01 u[IU]/mL (ref 0.35–4.50)

## 2017-11-12 MED ORDER — METFORMIN HCL 500 MG PO TABS: 500.0000 mg | ORAL_TABLET | Freq: Two times a day (BID) | ORAL | 1 refills | Status: DC

## 2017-11-12 NOTE — Progress Notes (Signed)
Subjective:    Patient ID: Gabriel Brewer, male    DOB: 1940-11-26, 77 y.o.   MRN: [id number]  HPI This is a 77 yo male who presents today to establish care. Previously seen at [facility] in [facility]. Last visit 08/20/17. Enjoys traveling.   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 Last hemoglobin A1c 08/20/17 was 5.6.   Hyperlipidemia on atorvastatin 10 mg. Last lipid panel 08/20/17 showed total cholesterol 161, HDL 58, LDL 81.   Has had more fatigue over the last month. Feels like his blood sugar might be running low but when he checks it is running a little higher over the last week or so. Seemed to correlate with feeling bad. Entire life has been "short winded." No big difference unless he is having a "spell" and has to sit down and catch his breath. Cough occasionally productive of clear or white drainage. No nasal drainage. No urinary symptoms. Has been told he has COPD. No blood in BMs or urine. Was seen earlier this month in ER for oozing from right facial surgical wound. Bleeding was stopped and did not recur.   PAF- sees cardiology every 6 months. No chest pain or palpitations.   Poor sleep some nights.   Past Medical History:  Diagnosis Date  . Cancer ([place]) 2017   skin   . Diabetes mellitus without complication ([place])   . GERD (gastroesophageal reflux disease)   . Hyperlipidemia   . Hypertension    Past Surgical History:  Procedure Laterality Date  . TONSILLECTOMY     Family History  Problem Relation Age of Onset  . Cerebral aneurysm Mother   . Heart disease Father   . COPD Father    Social History   Tobacco Use  . Smoking status: Former Smoker    Packs/day: 0.50    Years: 2.00    Pack years: 1.00    Types: Cigarettes    Last attempt to quit: 11/05/2010    Years since quitting: 7.0  .  Smokeless tobacco: Never Used  Substance Use Topics  . Alcohol use: No  . Drug use: No      Review of Systems Per HPI    Objective:   Physical Exam  Constitutional: He is oriented to person, place, and time. He appears well-developed and well-nourished. No distress.  HENT:  Head: Normocephalic and atraumatic.  Eyes: Conjunctivae are normal.  Neck: Normal range of motion. Neck supple.  Cardiovascular: Normal rate, regular rhythm and normal heart sounds.  Pulmonary/Chest: Effort normal and breath sounds normal.  Musculoskeletal: He exhibits no edema.  Neurological: He is alert and oriented to person, place, and time.  Skin: Skin is warm and dry. He is not diaphoretic.  Psychiatric: He has a normal mood and affect. His behavior is normal. Judgment and thought content normal.  Vitals reviewed.     BP 128/60 (BP Location: Left Arm, Patient Position: Sitting, Cuff Size: Normal)   Pulse 87   Temp 97.6 F (36.4 C) (Oral)   Ht 5' 11.5" (1.816 m)   Wt 173 lb 12.8 oz (78.8 kg)   SpO2 99%   BMI 23.90 kg/m  Wt Readings from Last 3 Encounters:  11/12/17 173 lb 12.8 oz (78.8 kg)  11/01/17 173 lb (78.5 kg)  01/09/17 165 lb (74.8 kg)  Assessment & Plan:  1. Chronic anticoagulation - CBC with Differential - Comprehensive metabolic panel - Ambulatory referral to Gastroenterology  2. Fatigue, unspecified type - CBC with Differential - Comprehensive metabolic panel - TSH  3. Controlled type 2 diabetes mellitus with complication, without long-term current use of insulin (HCC) - Hemoglobin A1c  - follow up in 6 months  Clarene Reamer, FNP-BC  [facility], [place]  11/12/2017 5:23 PM

## 2017-11-12 NOTE — Telephone Encounter (Signed)
[facility] lab called critical results, HGB - 7.2, HCT - 23.1. Results given to Gabriel Brewer

## 2017-11-12 NOTE — Patient Instructions (Signed)
Good to see you today  Please follow up in 6 months

## 2017-11-12 NOTE — Telephone Encounter (Signed)
Addressed.

## 2017-11-13 ENCOUNTER — Encounter: Payer: Self-pay | Admitting: Gastroenterology

## 2017-11-13 ENCOUNTER — Telehealth: Payer: Self-pay

## 2017-11-13 ENCOUNTER — Ambulatory Visit (INDEPENDENT_AMBULATORY_CARE_PROVIDER_SITE_OTHER): Payer: Medicare Other | Admitting: Gastroenterology

## 2017-11-13 ENCOUNTER — Other Ambulatory Visit (INDEPENDENT_AMBULATORY_CARE_PROVIDER_SITE_OTHER): Payer: Medicare Other

## 2017-11-13 ENCOUNTER — Other Ambulatory Visit: Payer: Self-pay

## 2017-11-13 VITALS — BP 125/63 | HR 75 | Ht 71.5 in | Wt 171.0 lb

## 2017-11-13 DIAGNOSIS — D649 Anemia, unspecified: Secondary | ICD-10-CM

## 2017-11-13 DIAGNOSIS — K921 Melena: Secondary | ICD-10-CM

## 2017-11-13 DIAGNOSIS — D509 Iron deficiency anemia, unspecified: Secondary | ICD-10-CM | POA: Diagnosis not present

## 2017-11-13 LAB — IBC PANEL
SATURATION RATIOS: 2 % — AB (ref 20.0–50.0)
TRANSFERRIN: 353 mg/dL (ref 212.0–360.0)

## 2017-11-13 LAB — FERRITIN: FERRITIN: 3.4 ng/mL — AB (ref 22.0–322.0)

## 2017-11-13 MED ORDER — SODIUM CHLORIDE 0.9% IV SOLUTION: Freq: Once | INTRAVENOUS | Status: DC

## 2017-11-13 NOTE — Telephone Encounter (Signed)
[name] this is the skype received on 11/13/17; when would you like to reschedule FU? Mandy RN said to cancel appt in AM but to hold appt until you see this skype.  [11/13/2017 4:10 PM]  [name], Sherald HessClerance Lav,    [11/13/2017 4:12 PM]  Ozzie HoyleMarykay Lex, how can I help?   [11/13/2017 4:14 PM]  [name], [name]:   I'm [name] [name]'s CMA. Our mutual pt Mr. Gabriel Brewer has a tranfusion appointment tomorrow but because he is scheduled to see you all the same day surgery center is not able to complete the scheduling process on their end. Is it possible for you all to change his office visit to a different date?   [11/13/2017 4:19 PM]  Ozzie HoyleJeani Hawking front office mgr said [name] wanted to see pt for 2 day FU on 11/14/17. [name] is not in office to day to ask if possible to change appt. Ay other thoughts   [11/13/2017 4:24 PM]  [name], [name]:   [name] what is the patient following up on [name] felt that patient should have blood transfusion ASAP base on his current hgb   [11/13/2017 4:26 PM]  Ozzie HoyleGlenda Chroman FNP had under lab result note, pt had some intermittent dark stools and wanted to see on 11/14/17 for guaiac, additional labs and discuss transfusion.   [11/13/2017 4:26 PM]  [name], [name]:   Is there any way at all that his follow up office visit can be changed to after the blood transfusion.     [11/13/2017 4:27 PM]  [name]:   let me get the clinical mgr for guidance. be right back. what time will transfusion be over?   [11/13/2017 4:27 PM]  [name], [name]:   This is why he was referred to our office   [11/13/2017 4:30 PM]  [name], [name]:   Blood transfusions can take 1-4 hours as patient will need to be monitored for adverse reactions   [11/13/2017 4:35 PM]  [name]:   d [name] FNP will not be here tomorrow afternoon but [name] RN clinical mgr said to cancel appt for D [name] in the morning and we have done that and then  will ck with [name] in AM to see when she wants to reschedule. thank you.   [11/13/2017 4:36 PM]  [name], [name]:   Thank you so much!

## 2017-11-13 NOTE — Progress Notes (Signed)
Gabriel Bellows MD, MRCP(U.K) [address]  [place]  [place], [place] [address]  Main: [phone]  Fax: [phone]   Gastroenterology Consultation  Referring Provider:     Elby Beck, FNP Primary Care Physician:  Elby Beck, FNP Primary Gastroenterologist:  Dr. Jonathon Brewer  Reason for Consultation:     Anemia         HPI:   Gabriel Brewer is a 77 y.o. y/o male referred for consultation & management  by Dr. [name], Dalbert Batman, FNP.    He has been referred to me to be seen urgently for a GI bleed.  She has been on Xarelto.  Apparently has been having intermittent black stools for last several weeks.  Looking back at his prior CBCs.  I noticed that the year back his hemoglobin was 13.4 g but his MCV was at the lower end of normal at 80.  Hemoglobin performed on 11/12/2017 showed a hemoglobin of 7.2 g with an MCV of 65.6.  Going back 2 years in 2017 I also note that he was microcytic at that point of time.  No iron studies available.  Last dose of Xarelto was last night .   He says that he has noticed 4-5 weeks back - very black and sticky stools, a few days later returned back to normal. Denies any NSAID use. Hasnot had an EGD but has had a  Colonoscopy in 2017 and was normal at [facility] .   Takes prilosec daily. No blood in his stool. After radiation on his nose , with a dry nose has had occasional nasal bleeds that last for a minute or so . No blood in the urine.   Feeling short of breath for a month, no chest pain. Brown stool yesterday.  +   Past Medical History:  Diagnosis Date  . Cancer ([place]) 2017   skin   . Diabetes mellitus without complication ([place])   . GERD (gastroesophageal reflux disease)   . Hyperlipidemia   . Hypertension     Past Surgical History:  Procedure Laterality Date  . TONSILLECTOMY      Prior to Admission medications   Medication Sig Start Date End Date Taking? Authorizing Provider  atorvastatin (LIPITOR) 10 MG tablet Take 1  tablet by mouth daily. 10/24/14   [provider]  benazepril (LOTENSIN) 40 MG tablet Take 1 tablet (40 mg total) by mouth every evening. 11/07/16   Gladstone Lighter, MD  Cholecalciferol 4000 units CAPS Take 1 capsule by mouth every 7 (seven) days.    [provider]  diltiazem (CARDIZEM CD) 180 MG 24 hr capsule Take 1 capsule (180 mg total) by mouth daily. 11/07/16   Gladstone Lighter, MD  FLUAD 0.5 ML SUSY ADM 0.5ML IM UTD 10/14/17   [provider]  fluticasone Asencion Islam) 50 MCG/ACT nasal spray  08/15/17   [provider]  gentamicin ointment (GARAMYCIN) 0.1 %  11/11/17   [provider]  glucose blood (ONE TOUCH ULTRA TEST) test strip TEST ONCE DAILY 12/09/16   [provider]  metFORMIN (GLUCOPHAGE) 500 MG tablet Take 1 tablet (500 mg total) by mouth 2 (two) times daily. 11/12/17   Elby Beck, FNP  omeprazole (PRILOSEC) 40 MG capsule Take 1 capsule by mouth daily. 09/21/14   [provider]  rivaroxaban (XARELTO) 20 MG TABS tablet Take 1 tablet (20 mg total) by mouth daily with supper. 11/07/16   Gladstone Lighter, MD  traMADol (ULTRAM) 50 MG tablet Take  1 tablet (50 mg total) by mouth every 6 (six) hours as needed. 01/09/17   [name], PA-C    Family History  Problem Relation Age of Onset  . Cerebral aneurysm Mother   . Heart disease Father   . COPD Father      Social History   Tobacco Use  . Smoking status: Former Smoker    Packs/day: 0.50    Years: 2.00    Pack years: 1.00    Types: Cigarettes    Last attempt to quit: 11/05/2010    Years since quitting: 7.0  . Smokeless tobacco: Never Used  Substance Use Topics  . Alcohol use: No  . Drug use: No    Allergies as of 11/13/2017  . (No Known Allergies)    Review of Systems:    All systems reviewed and negative except where noted in HPI.   Physical Exam:  There were no vitals taken for this visit. No LMP for male patient. Psych:  Alert and  cooperative. Normal mood and affect. General:   Alert,  Well-developed, well-nourished, pleasant and cooperative in NAD Head:  Normocephalic and atraumatic. Eyes:  Sclera clear, no icterus.   Conjunctiva pink. Ears:  Normal auditory acuity. Nose:  No deformity, discharge, or lesions. Mouth:  No deformity or lesions,oropharynx pink & moist. Neck:  Supple; no masses or thyromegaly. Lungs:  Respirations even and unlabored.  Clear throughout to auscultation.   No wheezes, crackles, or rhonchi. No acute distress. Heart:  Regular rate and rhythm; no murmurs, clicks, rubs, or gallops. Abdomen:  Normal bowel sounds.  No bruits.  Soft, non-tender and non-distended without masses, hepatosplenomegaly or hernias noted.  No guarding or rebound tenderness.    Neurologic:  Alert and oriented x3;  grossly normal neurologically. Skin:  Intact without significant lesions or rashes. No jaundice. Lymph Nodes:  No significant cervical adenopathy. Psych:  Alert and cooperative. Normal mood and affect.  Imaging Studies: No results found.  Assessment and Plan:   Gabriel Brewer is a 77 y.o. y/o male has been referred for acute drop in hemoglobin.  The patient has a microcytic anemia.  Microcytosis has been ongoing for more than 2 years.  No iron studies, B12 folate levels checked previously.  Has been on Xarelto.  Concern for black tarry stools.PResently stool brown. Likely on and off bleed for a while. Presently has some shortness of breath no chest pains    Plan 1.  Check iron studies, B12, folate, urine analysis, celiac serology. 2.  Prilosec 40 mg daily. 3.  No NSAIDs. 4.  Urgent EGD if negative will need a colonoscopy +/-  capsule study of the small bowel.Will get ok from cardiology to hold Xarelto  5.  We will check hemoglobin today, call cancer center and get him 1 unit PRBC transfused.   I have discussed alternative options, risks & benefits,  which include, but are not limited to, bleeding, infection,  perforation,respiratory complication & drug reaction.  The patient agrees with this plan & written consent will be obtained.    Follow up in 2 weeks   Dr Gabriel Bellows MD,MRCP(U.K)

## 2017-11-13 NOTE — Addendum Note (Signed)
Addended by: Dorethea Clan on: 11/13/2017 04:16 PM   Modules accepted: Orders, SmartSet

## 2017-11-13 NOTE — Telephone Encounter (Signed)
I spoke with Mrs Dugger [facility] signed) and cancelled appt with [name] for 11/14/17 at 10 AM. Mrs Sobotka voiced understanding and will wait for cb to reschedule appt after [name] reviews this note. Mrs Loppnow said pt is doing OK today.

## 2017-11-14 ENCOUNTER — Telehealth: Payer: Self-pay

## 2017-11-14 ENCOUNTER — Ambulatory Visit: Payer: Medicare Other | Admitting: Family Medicine

## 2017-11-14 ENCOUNTER — Other Ambulatory Visit: Payer: Self-pay | Admitting: Oncology

## 2017-11-14 ENCOUNTER — Ambulatory Visit
Admission: RE | Admit: 2017-11-14 | Discharge: 2017-11-14 | Disposition: A | Discharge status: Home / Self Care | Payer: Medicare Other | Source: Ambulatory Visit | Attending: Gastroenterology | Admitting: Gastroenterology

## 2017-11-14 ENCOUNTER — Other Ambulatory Visit: Payer: Self-pay

## 2017-11-14 DIAGNOSIS — D509 Iron deficiency anemia, unspecified: Secondary | ICD-10-CM | POA: Insufficient documentation

## 2017-11-14 DIAGNOSIS — D649 Anemia, unspecified: Secondary | ICD-10-CM

## 2017-11-14 DIAGNOSIS — D500 Iron deficiency anemia secondary to blood loss (chronic): Secondary | ICD-10-CM

## 2017-11-14 DIAGNOSIS — K921 Melena: Secondary | ICD-10-CM

## 2017-11-14 LAB — HEMOGLOBIN AND HEMATOCRIT, BLOOD
HCT: 24.7 % — ABNORMAL LOW (ref 39.0–52.0)
HEMOGLOBIN: 7 g/dL — AB (ref 13.0–17.0)

## 2017-11-14 LAB — PREPARE RBC (CROSSMATCH)

## 2017-11-14 LAB — ABO/RH: ABO/RH(D): AB POS

## 2017-11-14 MED ORDER — SODIUM CHLORIDE 0.9% IV SOLUTION: Freq: Once | INTRAVENOUS | Status: DC

## 2017-11-14 NOTE — Progress Notes (Signed)
Order for blood transfusion was entered on 11/13/17 and discontinued same day. I spoke with [name] who confirmed patient is to receive one unit pRBC this morning. Order entered.

## 2017-11-14 NOTE — Telephone Encounter (Signed)
Spoke with pt and informed him that we have received his blood thinner clearance for the Xarelto. I explained Dr. Audria Nine instructions to stop the Xarelto 2 days prior to the procedure and resume 1 day after. Pt is also aware of prep instructions prior the colonoscopy procedure.

## 2017-11-15 LAB — TYPE AND SCREEN
ABO/RH(D): AB POS
Antibody Screen: NEGATIVE
Unit division: 0

## 2017-11-15 LAB — BPAM RBC
BLOOD PRODUCT EXPIRATION DATE: 201911222359
ISSUE DATE / TIME: 201911011014
Unit Type and Rh: 7300

## 2017-11-16 ENCOUNTER — Encounter: Payer: Self-pay | Admitting: Anesthesiology

## 2017-11-16 LAB — URINALYSIS
Bilirubin, UA: NEGATIVE
GLUCOSE, UA: NEGATIVE
KETONES UA: NEGATIVE
LEUKOCYTES UA: NEGATIVE
Nitrite, UA: NEGATIVE
PROTEIN UA: NEGATIVE
RBC, UA: NEGATIVE
Specific Gravity, UA: 1.023 (ref 1.005–1.030)
Urobilinogen, Ur: 0.2 mg/dL (ref 0.2–1.0)
pH, UA: 5 (ref 5.0–7.5)

## 2017-11-16 LAB — IRON,TIBC AND FERRITIN PANEL
FERRITIN: 5 ng/mL — AB (ref 30–400)
IRON: 8 ug/dL — AB (ref 38–169)
Iron Saturation: 2 % — CL (ref 15–55)
TIBC: 395 ug/dL (ref 250–450)
UIBC: 387 ug/dL — AB (ref 111–343)

## 2017-11-16 LAB — CELIAC DISEASE PANEL
Endomysial IgA: NEGATIVE
IgA/Immunoglobulin A, Serum: 169 mg/dL (ref 61–437)
Transglutaminase IgA: <2 U/mL (ref 0–3)

## 2017-11-17 ENCOUNTER — Ambulatory Visit: Payer: Medicare Other | Admitting: Anesthesiology

## 2017-11-17 ENCOUNTER — Other Ambulatory Visit: Payer: Self-pay

## 2017-11-17 ENCOUNTER — Ambulatory Visit
Admission: RE | Admit: 2017-11-17 | Discharge: 2017-11-17 | Disposition: A | Discharge status: Home / Self Care | Payer: Medicare Other | Source: Ambulatory Visit | Attending: Gastroenterology | Admitting: Gastroenterology

## 2017-11-17 ENCOUNTER — Encounter
Admission: RE | Disposition: A | Discharge status: Home / Self Care | Payer: Self-pay | Source: Ambulatory Visit | Attending: Gastroenterology

## 2017-11-17 DIAGNOSIS — K219 Gastro-esophageal reflux disease without esophagitis: Secondary | ICD-10-CM | POA: Diagnosis not present

## 2017-11-17 DIAGNOSIS — K449 Diaphragmatic hernia without obstruction or gangrene: Secondary | ICD-10-CM | POA: Insufficient documentation

## 2017-11-17 DIAGNOSIS — K640 First degree hemorrhoids: Secondary | ICD-10-CM | POA: Insufficient documentation

## 2017-11-17 DIAGNOSIS — K921 Melena: Secondary | ICD-10-CM

## 2017-11-17 DIAGNOSIS — Z85828 Personal history of other malignant neoplasm of skin: Secondary | ICD-10-CM | POA: Diagnosis not present

## 2017-11-17 DIAGNOSIS — K5790 Diverticulosis of intestine, part unspecified, without perforation or abscess without bleeding: Secondary | ICD-10-CM | POA: Diagnosis not present

## 2017-11-17 DIAGNOSIS — K635 Polyp of colon: Secondary | ICD-10-CM | POA: Diagnosis not present

## 2017-11-17 DIAGNOSIS — Z7951 Long term (current) use of inhaled steroids: Secondary | ICD-10-CM | POA: Insufficient documentation

## 2017-11-17 DIAGNOSIS — Z79899 Other long term (current) drug therapy: Secondary | ICD-10-CM | POA: Insufficient documentation

## 2017-11-17 DIAGNOSIS — D120 Benign neoplasm of cecum: Secondary | ICD-10-CM | POA: Insufficient documentation

## 2017-11-17 DIAGNOSIS — Z7984 Long term (current) use of oral hypoglycemic drugs: Secondary | ICD-10-CM | POA: Diagnosis not present

## 2017-11-17 DIAGNOSIS — Z7901 Long term (current) use of anticoagulants: Secondary | ICD-10-CM | POA: Insufficient documentation

## 2017-11-17 DIAGNOSIS — E119 Type 2 diabetes mellitus without complications: Secondary | ICD-10-CM | POA: Diagnosis not present

## 2017-11-17 DIAGNOSIS — Z87891 Personal history of nicotine dependence: Secondary | ICD-10-CM | POA: Insufficient documentation

## 2017-11-17 DIAGNOSIS — D126 Benign neoplasm of colon, unspecified: Secondary | ICD-10-CM | POA: Diagnosis not present

## 2017-11-17 DIAGNOSIS — E785 Hyperlipidemia, unspecified: Secondary | ICD-10-CM | POA: Insufficient documentation

## 2017-11-17 DIAGNOSIS — I10 Essential (primary) hypertension: Secondary | ICD-10-CM | POA: Insufficient documentation

## 2017-11-17 DIAGNOSIS — E782 Mixed hyperlipidemia: Secondary | ICD-10-CM | POA: Diagnosis not present

## 2017-11-17 DIAGNOSIS — K5730 Diverticulosis of large intestine without perforation or abscess without bleeding: Secondary | ICD-10-CM | POA: Diagnosis not present

## 2017-11-17 DIAGNOSIS — D509 Iron deficiency anemia, unspecified: Secondary | ICD-10-CM | POA: Diagnosis not present

## 2017-11-17 DIAGNOSIS — K648 Other hemorrhoids: Secondary | ICD-10-CM | POA: Diagnosis not present

## 2017-11-17 DIAGNOSIS — I4891 Unspecified atrial fibrillation: Secondary | ICD-10-CM | POA: Insufficient documentation

## 2017-11-17 HISTORY — PX: ESOPHAGOGASTRODUODENOSCOPY (EGD) WITH PROPOFOL: SHX5813

## 2017-11-17 HISTORY — PX: COLONOSCOPY WITH PROPOFOL: SHX5780

## 2017-11-17 LAB — GLUCOSE, CAPILLARY: Glucose-Capillary: 131 mg/dL — ABNORMAL HIGH (ref 70–99)

## 2017-11-17 SURGERY — COLONOSCOPY WITH PROPOFOL
Anesthesia: General

## 2017-11-17 MED ORDER — PROPOFOL 10 MG/ML IV BOLUS
INTRAVENOUS | Status: DC | PRN
  Administered 2017-11-17 (×2): 40 mg via INTRAVENOUS

## 2017-11-17 MED ORDER — PHENYLEPHRINE HCL 10 MG/ML IJ SOLN
INTRAMUSCULAR | Status: DC | PRN
  Administered 2017-11-17 (×5): 100 ug via INTRAVENOUS

## 2017-11-17 MED ORDER — SODIUM CHLORIDE 0.9 % IV SOLN
INTRAVENOUS | Status: DC
  Administered 2017-11-17: 09:00:00 via INTRAVENOUS

## 2017-11-17 MED ORDER — PROPOFOL 500 MG/50ML IV EMUL
INTRAVENOUS | Status: DC | PRN
  Administered 2017-11-17: 130 ug/kg/min via INTRAVENOUS

## 2017-11-17 MED ORDER — LIDOCAINE HCL (PF) 2 % IJ SOLN
INTRAMUSCULAR | Status: DC | PRN
  Administered 2017-11-17: 100 mg via INTRADERMAL

## 2017-11-17 NOTE — Op Note (Signed)
[facility] Gastroenterology Patient Name: Gabriel Brewer Procedure Date: 11/17/2017 8:34 AM MRN: [id number] Account #: [id number] Date of Birth: [birth year] Admit Type: Outpatient Age: 77 Room: [facility] ENDO ROOM 4 Gender: Male Note Status: Finalized Procedure:            Upper GI endoscopy Indications:          Iron deficiency anemia Providers:            Jonathon Bellows MD, MD Referring MD:         Elby Beck (Referring MD) Medicines:            Monitored Anesthesia Care Complications:        No immediate complications. Procedure:            Pre-Anesthesia Assessment:                       - Prior to the procedure, a History and Physical was                        performed, and patient medications, allergies and                        sensitivities were reviewed. The patient's tolerance of                        previous anesthesia was reviewed.                       - The risks and benefits of the procedure and the                        sedation options and risks were discussed with the                        patient. All questions were answered and informed                        consent was obtained.                       - ASA Grade Assessment: III - A patient with severe                        systemic disease.                       After obtaining informed consent, the endoscope was                        passed under direct vision. Throughout the procedure,                        the patient's blood pressure, pulse, and oxygen                        saturations were monitored continuously. The Endoscope                        was introduced through the mouth, and advanced to the  third part of duodenum. The upper GI endoscopy was                        accomplished with ease. The patient tolerated the                        procedure well. Findings:      The examined duodenum was normal.      A 3 cm hiatal hernia was present.  The cardia and gastric fundus were normal on retroflexion.      mild mucosal changes characterized by an inlet patch were found in the       upper third of the esophagus. Impression:           - Normal examined duodenum.                       - 3 cm hiatal hernia.                       - Mucosal changes in the esophagus.                       - No specimens collected. Recommendation:       - Perform a colonoscopy today. Procedure Code(s):    --- Professional ---                       [phone], Esophagogastroduodenoscopy, flexible, transoral;                        diagnostic, including collection of specimen(s) by                        brushing or washing, when performed (separate procedure) Diagnosis Code(s):    --- Professional ---                       K44.9, Diaphragmatic hernia without obstruction or                        gangrene                       K22.8, Other specified diseases of esophagus                       D50.9, Iron deficiency anemia, unspecified CPT copyright 2018 American [employer]. All rights reserved. The codes documented in this report are preliminary and upon coder review may  be revised to meet current compliance requirements. Jonathon Bellows, MD Jonathon Bellows MD, MD 11/17/2017 8:44:06 AM This report has been signed electronically. Number of Addenda: 0 Note Initiated On: 11/17/2017 8:34 AM      [facility]

## 2017-11-17 NOTE — Anesthesia Preprocedure Evaluation (Signed)
Anesthesia Evaluation  Patient identified by MRN, date of birth, ID band Patient awake    Reviewed: Allergy & Precautions, NPO status , Patient's Chart, lab work & pertinent test results, reviewed documented beta blocker date and time   Airway Mallampati: II  TM Distance: >3 FB     Dental  (+) Chipped   Pulmonary pneumonia, resolved, former smoker,           Cardiovascular hypertension, Pt. on medications + dysrhythmias Atrial Fibrillation      Neuro/Psych    GI/Hepatic GERD  ,  Endo/Other  diabetes, Type 2  Renal/GU      Musculoskeletal   Abdominal   Peds  Hematology  (+) anemia ,   Anesthesia Other Findings Hb 7.0, has received blood. EKG now shows normal rhythm. EF 60-65. Stopped Xarelto 3 days.  Reproductive/Obstetrics                             Anesthesia Physical Anesthesia Plan  ASA: III  Anesthesia Plan: General   Post-op Pain Management:    Induction: Intravenous  PONV Risk Score and Plan:   Airway Management Planned:   Additional Equipment:   Intra-op Plan:   Post-operative Plan:   Informed Consent: I have reviewed the patients History and Physical, chart, labs and discussed the procedure including the risks, benefits and alternatives for the proposed anesthesia with the patient or authorized representative who has indicated his/her understanding and acceptance.     Plan Discussed with: CRNA  Anesthesia Plan Comments:         Anesthesia Quick Evaluation

## 2017-11-17 NOTE — Anesthesia Postprocedure Evaluation (Signed)
Anesthesia Post Note  Patient: Gabriel Brewer  Procedure(s) Performed: COLONOSCOPY WITH PROPOFOL (N/A ) ESOPHAGOGASTRODUODENOSCOPY (EGD) WITH PROPOFOL (N/A )  Patient location during evaluation: Endoscopy Anesthesia Type: General Level of consciousness: awake and alert Pain management: pain level controlled Vital Signs Assessment: post-procedure vital signs reviewed and stable Respiratory status: spontaneous breathing, nonlabored ventilation, respiratory function stable and patient connected to nasal cannula oxygen Cardiovascular status: blood pressure returned to baseline and stable Postop Assessment: no apparent nausea or vomiting Anesthetic complications: no     Last Vitals:  Vitals:   11/17/17 0945 11/17/17 0955  BP: 112/75 118/69  Pulse: 77 78  Resp: 15 19  Temp:    SpO2: 100% 100%    Last Pain:  Vitals:   11/17/17 0945  TempSrc:   PainSc: 0-No pain                 Santanna Olenik S

## 2017-11-17 NOTE — Transfer of Care (Signed)
Immediate Anesthesia Transfer of Care Note  Patient: Gabriel Brewer  Procedure(s) Performed: COLONOSCOPY WITH PROPOFOL (N/A ) ESOPHAGOGASTRODUODENOSCOPY (EGD) WITH PROPOFOL (N/A )  Patient Location: PACU  Anesthesia Type:General  Level of Consciousness: sedated  Airway & Oxygen Therapy: Patient Spontanous Breathing and Patient connected to nasal cannula oxygen  Post-op Assessment: Report given to RN and Post -op Vital signs reviewed and stable  Post vital signs: Reviewed and stable  Last Vitals:  Vitals Value Taken Time  BP    Temp    Pulse    Resp    SpO2      Last Pain:  Vitals:   11/17/17 0817  TempSrc: Tympanic  PainSc: 0-No pain         Complications: No apparent anesthesia complications

## 2017-11-17 NOTE — Anesthesia Procedure Notes (Signed)
Date/Time: 11/17/2017 8:44 AM Performed by: Nelda Marseille, CRNA Pre-anesthesia Checklist: Patient identified, Emergency Drugs available, Suction available, Patient being monitored and Timeout performed Oxygen Delivery Method: Nasal cannula

## 2017-11-17 NOTE — Anesthesia Post-op Follow-up Note (Signed)
Anesthesia QCDR form completed.        

## 2017-11-17 NOTE — H&P (Signed)
Jonathon Bellows, MD [address], [place], [place], [place], [address] [address], [place], [place], [place], [address] Phone: [phone]  Fax: [phone]  Primary Care Physician:  Elby Beck, FNP   Pre-Procedure History & Physical: HPI:  Gabriel Brewer is a 77 y.o. male is here for an endoscopy and colonoscopy    Past Medical History:  Diagnosis Date  . Cancer ([place]) 2017   skin   . Diabetes mellitus without complication ([place])   . GERD (gastroesophageal reflux disease)   . Hyperlipidemia   . Hypertension     Past Surgical History:  Procedure Laterality Date  . TONSILLECTOMY      Prior to Admission medications   Medication Sig Start Date End Date Taking? Authorizing Provider  benazepril (LOTENSIN) 40 MG tablet Take 1 tablet (40 mg total) by mouth every evening. 11/07/16  Yes Gladstone Lighter, MD  Cholecalciferol 4000 units CAPS Take 1 capsule by mouth every 7 (seven) days.   Yes [provider]  diltiazem (CARDIZEM CD) 180 MG 24 hr capsule Take 1 capsule (180 mg total) by mouth daily. 11/07/16  Yes Gladstone Lighter, MD  fluticasone (FLONASE) 50 MCG/ACT nasal spray as needed.  08/15/17  Yes [provider]  gentamicin ointment (GARAMYCIN) 0.1 % daily as needed.  11/11/17  Yes [provider]  metFORMIN (GLUCOPHAGE) 500 MG tablet Take 1 tablet (500 mg total) by mouth 2 (two) times daily. 11/12/17  Yes Elby Beck, FNP  omeprazole (PRILOSEC) 40 MG capsule Take 1 capsule by mouth daily. 09/21/14  Yes [provider]  atorvastatin (LIPITOR) 10 MG tablet Take 1 tablet by mouth daily. 10/24/14   [provider]  FLUAD 0.5 ML SUSY ADM 0.5ML IM UTD 10/14/17   [provider]  glucose blood (ONE TOUCH ULTRA TEST) test strip TEST ONCE DAILY 12/09/16   [provider]  rivaroxaban (XARELTO) 20 MG TABS tablet Take 1 tablet (20 mg total) by mouth daily with supper. 11/07/16   Gladstone Lighter, MD    traMADol (ULTRAM) 50 MG tablet Take 1 tablet (50 mg total) by mouth every 6 (six) hours as needed. Patient not taking: Reported on 11/14/2017 01/09/17   [name], PA-C    Allergies as of 11/14/2017  . (No Known Allergies)    Family History  Problem Relation Age of Onset  . Cerebral aneurysm Mother   . Heart disease Father   . COPD Father     Social History   Socioeconomic History  . Marital status: Married    Spouse name: Not on file  . Number of children: Not on file  . Years of education: Not on file  . Highest education level: Not on file  Occupational History  . Not on file  Social Needs  . Financial resource strain: Not on file  . Food insecurity:    Worry: Not on file    Inability: Not on file  . Transportation needs:    Medical: Not on file    Non-medical: Not on file  Tobacco Use  . Smoking status: Former Smoker    Packs/day: 0.50    Years: 2.00    Pack years: 1.00    Types: Cigarettes    Last attempt to quit: 11/05/2010    Years since quitting: 7.0  . Smokeless tobacco: Never Used  Substance and Sexual Activity  . Alcohol use: No  . Drug use: No  . Sexual activity: Not Currently  Partners: Female  Lifestyle  . Physical activity:    Days per week: Not on file    Minutes per session: Not on file  . Stress: Not on file  Relationships  . Social connections:    Talks on phone: Not on file    Gets together: Not on file    Attends religious service: Not on file    Active member of club or organization: Not on file    Attends meetings of clubs or organizations: Not on file    Relationship status: Not on file  . Intimate partner violence:    Fear of current or ex partner: Not on file    Emotionally abused: Not on file    Physically abused: Not on file    Forced sexual activity: Not on file  Other Topics Concern  . Not on file  Social History Narrative  . Not on file    Review of Systems: See HPI, otherwise negative ROS  Physical  Exam: BP 140/78   Pulse 83   Temp (!) 97.5 F (36.4 C) (Tympanic)   Resp 18   Ht 5' 11.5" (1.816 m)   Wt 77.1 kg   SpO2 100%   BMI 23.38 kg/m  General:   Alert,  pleasant and cooperative in NAD Head:  Normocephalic and atraumatic. Neck:  Supple; no masses or thyromegaly. Lungs:  Clear throughout to auscultation, normal respiratory effort.    Heart:  +S1, +S2, Regular rate and rhythm, No edema. Abdomen:  Soft, nontender and nondistended. Normal bowel sounds, without guarding, and without rebound.   Neurologic:  Alert and  oriented x4;  grossly normal neurologically.  Impression/Plan: Gabriel Brewer is here for an endoscopy and colonoscopy  to be performed for  evaluation of iron deficiency anemia    Risks, benefits, limitations, and alternatives regarding endoscopy have been reviewed with the patient.  Questions have been answered.  All parties agreeable.   Jonathon Bellows, MD  11/17/2017, 8:25 AM

## 2017-11-17 NOTE — Op Note (Signed)
[facility] Gastroenterology Patient Name: Gabriel Brewer Procedure Date: 11/17/2017 8:34 AM MRN: [id number] Account #: [id number] Date of Birth: 11-Nov-1940 Admit Type: Outpatient Age: 77 Room: [facility] ENDO ROOM 4 Gender: Male Note Status: Finalized Procedure:            Colonoscopy Indications:          Iron deficiency anemia Providers:            Jonathon Bellows MD, MD Referring MD:         Elby Beck (Referring MD) Medicines:            Monitored Anesthesia Care Complications:        No immediate complications. Procedure:            Pre-Anesthesia Assessment:                       - Prior to the procedure, a History and Physical was                        performed, and patient medications, allergies and                        sensitivities were reviewed. The patient's tolerance of                        previous anesthesia was reviewed.                       - The risks and benefits of the procedure and the                        sedation options and risks were discussed with the                        patient. All questions were answered and informed                        consent was obtained.                       - ASA Grade Assessment: III - A patient with severe                        systemic disease.                       After obtaining informed consent, the colonoscope was                        passed under direct vision. Throughout the procedure,                        the patient's blood pressure, pulse, and oxygen                        saturations were monitored continuously. The                        Colonoscope was introduced through the anus and                        advanced  to the the cecum, identified by the                        appendiceal orifice, IC valve and transillumination.                        The colonoscopy was performed without difficulty. The                        patient tolerated the procedure well. The quality of                    the bowel preparation was good. Findings:      The perianal and digital rectal examinations were normal.      Non-bleeding internal hemorrhoids were found. The hemorrhoids were       medium-sized and Grade I (internal hemorrhoids that do not prolapse).      Multiple small-mouthed diverticula were found in the sigmoid colon.      A 15 mm polyp was found in the cecum. The polyp was sessile.       Preparations were made for mucosal resection. 10 mL of Eleview was       injected with adequate lift of the lesion from the muscularis propria.       Snare mucosal resection with suction (via the working channel) retrieval       was performed. A 17 mm area was resected. Two pieces were resected in       total. Resection was complete, and retrieval was complete. There was no       bleeding during and at the end of the procedure. To prevent bleeding       after mucosal resection, four hemostatic clips were successfully placed.       There was no bleeding during, or at the end, of the procedure.      The exam was otherwise without abnormality.      The exam was otherwise without abnormality on direct and retroflexion       views. Impression:           - Non-bleeding internal hemorrhoids.                       - Diverticulosis in the sigmoid colon.                       - One 15 mm polyp in the cecum, removed with mucosal                        resection. Resected and retrieved. Clips were placed.                       - The examination was otherwise normal.                       - The examination was otherwise normal on direct and                        retroflexion views.                       - Mucosal resection was performed. Resection was  complete, and retrieval was complete. Recommendation:       - Discharge patient to home (with escort).                       - Resume previous diet.                       - Continue present medications.                        - To visualize the small bowel, perform video capsule                        endoscopy in 2 weeks.                       - Restart Xarelto tomorrow Procedure Code(s):    --- Professional ---                       [phone], Colonoscopy, flexible; with endoscopic mucosal                        resection Diagnosis Code(s):    --- Professional ---                       K64.0, First degree hemorrhoids                       D12.0, Benign neoplasm of cecum                       D50.9, Iron deficiency anemia, unspecified                       K57.30, Diverticulosis of large intestine without                        perforation or abscess without bleeding CPT copyright 2018 American Medical Association. All rights reserved. The codes documented in this report are preliminary and upon coder review may  be revised to meet current compliance requirements. Jonathon Bellows, MD Jonathon Bellows MD, MD 11/17/2017 9:24:33 AM This report has been signed electronically. Number of Addenda: 0 Note Initiated On: 11/17/2017 8:34 AM Scope Withdrawal Time: 0 hours 30 minutes 7 seconds  Total Procedure Duration: 0 hours 35 minutes 26 seconds       [facility]

## 2017-11-18 ENCOUNTER — Encounter: Payer: Self-pay | Admitting: Gastroenterology

## 2017-11-19 ENCOUNTER — Inpatient Hospital Stay: Payer: Medicare Other | Attending: Oncology | Admitting: Oncology

## 2017-11-19 ENCOUNTER — Inpatient Hospital Stay: Payer: Medicare Other

## 2017-11-19 ENCOUNTER — Encounter: Payer: Self-pay | Admitting: Oncology

## 2017-11-19 ENCOUNTER — Other Ambulatory Visit: Payer: Self-pay

## 2017-11-19 VITALS — BP 146/78 | HR 74 | Temp 97.8°F | Resp 18 | Ht 71.0 in | Wt 172.9 lb

## 2017-11-19 VITALS — BP 134/73 | HR 68

## 2017-11-19 DIAGNOSIS — K922 Gastrointestinal hemorrhage, unspecified: Secondary | ICD-10-CM | POA: Insufficient documentation

## 2017-11-19 DIAGNOSIS — Z87891 Personal history of nicotine dependence: Secondary | ICD-10-CM | POA: Diagnosis not present

## 2017-11-19 DIAGNOSIS — D500 Iron deficiency anemia secondary to blood loss (chronic): Secondary | ICD-10-CM | POA: Insufficient documentation

## 2017-11-19 DIAGNOSIS — R5383 Other fatigue: Secondary | ICD-10-CM | POA: Diagnosis not present

## 2017-11-19 DIAGNOSIS — Z7901 Long term (current) use of anticoagulants: Secondary | ICD-10-CM | POA: Insufficient documentation

## 2017-11-19 DIAGNOSIS — R0602 Shortness of breath: Secondary | ICD-10-CM

## 2017-11-19 DIAGNOSIS — I4891 Unspecified atrial fibrillation: Secondary | ICD-10-CM | POA: Insufficient documentation

## 2017-11-19 LAB — SURGICAL PATHOLOGY

## 2017-11-19 MED ORDER — IRON SUCROSE 20 MG/ML IV SOLN
200.0000 mg | Freq: Once | INTRAVENOUS | Status: AC
  Administered 2017-11-19: 200 mg via INTRAVENOUS
  Filled 2017-11-19: qty 10

## 2017-11-19 MED ORDER — SODIUM CHLORIDE 0.9 % IV SOLN
Freq: Once | INTRAVENOUS | Status: AC
  Administered 2017-11-19: 12:00:00 via INTRAVENOUS
  Filled 2017-11-19: qty 250

## 2017-11-19 NOTE — Progress Notes (Signed)
Patient here for initial visit. °

## 2017-11-20 NOTE — Progress Notes (Signed)
Hematology/Oncology Consult note [facility] [phone] [phone]   Patient Care Team: Gabriel Beck, FNP as PCP - General (Nurse Practitioner)  REFERRING PROVIDER: Napoleon Brewer CHIEF COMPLAINTS/REASON FOR VISIT:  Evaluation of iron deficiency anemia  HISTORY OF PRESENTING ILLNESS:  Gabriel Brewer is a  77 y.o.  male with PMH listed below who was referred to me for evaluation of iron deficiency anemia.  Patient was referred to see Gabriel Brewer for evaluation of GI bleeding.  Patient is on chronic anticoagulation Xarelto for atrial fibrillation. He noticed having intermittent black stools for the past couple of weeks. 11/12/2017, CBC showed hemoglobin 7.2, MCV 65.6. Reviewed patient previous labs.  Since March 2016, he has started to have mild anemia with MCV of 79.8.  History of skin cancer in 2017.  History of nonrheumatic mitral regurgitation and history of encarditis mitral valve.    He is s/p 1 unit of PRBC transfusion last week. Feels fatigue is slightly better. Mild SOB with exertion.  11/17/2017 endoscopy and colonoscopy showed 3 cm hiatal hernia, mucosal changes in the esophagus.  Nonbleeding internal hemorrhoids, diverticulosis in the sigmoid colon.  150 mm polyp in the cecum, removed with mucosal resection.  Examination otherwise normal. There is plan for video capsule endoscopy in 2 weeks.  Patient was referred to hematology for discussion of future management plan for iron deficiency anemia.  Review of Systems  Constitutional: Positive for malaise/fatigue. Negative for chills, fever and weight loss.  HENT: Negative for nosebleeds and sore throat.   Eyes: Negative for double vision, photophobia and redness.  Respiratory: Positive for shortness of breath. Negative for cough and wheezing.   Cardiovascular: Negative for chest pain, palpitations and orthopnea.  Gastrointestinal: Positive for melena. Negative for abdominal pain, nausea and  vomiting.  Genitourinary: Negative for dysuria.  Musculoskeletal: Negative for back pain, myalgias and neck pain.  Skin: Negative for itching and rash.  Neurological: Negative for dizziness, tingling and tremors.  Endo/Heme/Allergies: Negative for environmental allergies. Does not bruise/bleed easily.  Psychiatric/Behavioral: Negative for depression.    MEDICAL HISTORY:  Past Medical History:  Diagnosis Date  . Cancer (Gabriel Brewer) 2017   skin   . Diabetes mellitus without complication ([place])   . GERD (gastroesophageal reflux disease)   . Hyperlipidemia   . Hypertension     SURGICAL HISTORY: Past Surgical History:  Procedure Laterality Date  . COLONOSCOPY WITH PROPOFOL N/A 11/17/2017   Procedure: COLONOSCOPY WITH PROPOFOL;  Surgeon: Gabriel Bellows, MD;  Location: [facility] ENDOSCOPY;  Service: Gastroenterology;  Laterality: N/A;  . ESOPHAGOGASTRODUODENOSCOPY (EGD) WITH PROPOFOL N/A 11/17/2017   Procedure: ESOPHAGOGASTRODUODENOSCOPY (EGD) WITH PROPOFOL;  Surgeon: Gabriel Bellows, MD;  Location[facility] ENDOSCOPY;  Service: Gastroenterology;  Laterality: N/A;  . TONSILLECTOMY      SOCIAL HISTORY: Social History   Socioeconomic History  . Marital status: Married    Spouse name: Not on file  . Number of children: Not on file  . Years of education: Not on file  . Highest education level: Not on file  Occupational History  . Occupation: retired  Scientific laboratory technician  . Financial resource strain: Not on file  . Food insecurity:    Worry: Not on file    Inability: Not on file  . Transportation needs:    Medical: Not on file    Non-medical: Not on file  Tobacco Use  . Smoking status: Former Smoker    Packs/day: 0.50    Years: 20.00    Pack years: 10.00  Types: Cigarettes    Last attempt to quit: 11/20/1982    Years since quitting: 35.0  . Smokeless tobacco: Never Used  Substance and Sexual Activity  . Alcohol use: No  . Drug use: No  . Sexual activity: Not Currently    Partners: Female    Lifestyle  . Physical activity:    Days per week: Not on file    Minutes per session: Not on file  . Stress: Not on file  Relationships  . Social connections:    Talks on phone: Not on file    Gets together: Not on file    Attends religious service: Not on file    Active member of club or organization: Not on file    Attends meetings of clubs or organizations: Not on file    Relationship status: Not on file  . Intimate partner violence:    Fear of current or ex partner: Not on file    Emotionally abused: Not on file    Physically abused: Not on file    Forced sexual activity: Not on file  Other Topics Concern  . Not on file  Social History Narrative  . Not on file    FAMILY HISTORY: Family History  Problem Relation Age of Onset  . Cerebral aneurysm Mother   . Heart disease Father   . COPD Father   . Breast cancer Sister   . Cancer Brother        unknowsn    ALLERGIES:  has No Known Allergies.  MEDICATIONS:  Current Outpatient Medications  Medication Sig Dispense Refill  . benazepril (LOTENSIN) 40 MG tablet Take 1 tablet (40 mg total) by mouth every evening. 30 tablet 2  . Cholecalciferol 4000 units CAPS Take 1 capsule by mouth every 7 (seven) days.    Marland Kitchen diltiazem (CARDIZEM CD) 180 MG 24 hr capsule Take 1 capsule (180 mg total) by mouth daily. 30 capsule 2  . fluticasone (FLONASE) 50 MCG/ACT nasal spray as needed.     Marland Kitchen gentamicin ointment (GARAMYCIN) 0.1 % daily as needed.     Marland Kitchen glucose blood (ONE TOUCH ULTRA TEST) test strip TEST ONCE DAILY    . metFORMIN (GLUCOPHAGE) 500 MG tablet Take 1 tablet (500 mg total) by mouth 2 (two) times daily. 180 tablet 1  . omeprazole (PRILOSEC) 40 MG capsule Take 1 capsule by mouth daily.    . rivaroxaban (XARELTO) 20 MG TABS tablet Take 1 tablet (20 mg total) by mouth daily with supper. 30 tablet 2  . atorvastatin (LIPITOR) 10 MG tablet Take 1 tablet by mouth daily.    Marland Kitchen FLUAD 0.5 ML SUSY ADM 0.5ML IM UTD  0  . traMADol (ULTRAM)  50 MG tablet Take 1 tablet (50 mg total) by mouth every 6 (six) hours as needed. (Patient not taking: Reported on 11/14/2017) 20 tablet 0   Current Facility-Administered Medications  Medication Dose Route Frequency Provider Last Rate Last Dose  . 0.9 %  sodium chloride infusion (Manually program via Guardrails IV Fluids)   Intravenous Once Gabriel Bellows, MD         PHYSICAL EXAMINATION: ECOG PERFORMANCE STATUS: 1 - Symptomatic but completely ambulatory Vitals:   11/19/17 1038  BP: (!) 146/78  Pulse: 74  Resp: 18  Temp: 97.8 F (36.6 C)  SpO2: 96%   Filed Weights   11/19/17 1038  Weight: 172 lb 14.4 oz (78.4 kg)    Physical Exam  Constitutional: He is oriented to person, place, and time.  No distress.  HENT:  Head: Normocephalic and atraumatic.  Mouth/Throat: Oropharynx is clear and moist.  Eyes: Pupils are equal, round, and reactive to light. EOM are normal. No scleral icterus.  Pale conjuctivae  Neck: Normal range of motion. Neck supple.  Cardiovascular: Normal rate, regular rhythm and normal heart sounds.  Pulmonary/Chest: Effort normal. No respiratory distress. He has no wheezes.  Abdominal: Soft. Bowel sounds are normal. He exhibits no distension and no mass. There is no tenderness.  Musculoskeletal: Normal range of motion. He exhibits no edema or deformity.  Neurological: He is alert and oriented to person, place, and time. No cranial nerve deficit. Coordination normal.  Skin: Skin is warm and dry. No rash noted. No erythema.  Psychiatric: He has a normal mood and affect. His behavior is normal. Thought content normal.     LABORATORY DATA:  I have reviewed the data as listed Lab Results  Component Value Date   WBC 5.5 11/12/2017   HGB 7.0 (L) 11/14/2017   HCT 24.7 (L) 11/14/2017   MCV 65.6 (L) 11/12/2017   PLT 305.0 11/12/2017   Recent Labs    01/09/17 1050 11/12/17 1151  NA 135 139  K 3.8 4.1  CL 106 105  CO2 23 23  GLUCOSE 105* 112*  BUN 22* 22    CREATININE 1.11 1.00  CALCIUM 9.0 9.5  GFRNONAA >60  --   GFRAA >60  --   PROT 6.8 6.8  ALBUMIN 4.1 4.5  AST 22 15  ALT 12* 11  ALKPHOS 77 55  BILITOT 1.0 0.5   Iron/TIBC/Ferritin/ %Sat    Component Value Date/Time   IRON 8 (LL) 11/13/2017 1415   TIBC 395 11/13/2017 1415   FERRITIN 5 (L) 11/13/2017 1415   IRONPCTSAT 2 (LL) 11/13/2017 1415       ASSESSMENT & PLAN:  1. Iron deficiency anemia due to chronic blood loss   2. Chronic anticoagulation   3. Atrial fibrillation, unspecified type [facility])    Previous labs reviewed and discussed with patient. Consistent with sever iron deficiency anemia. Reviewed upper endoscopy and colonoscopy results, no acute bleeding source identified. Suspect bleeding from small bowel. He will have capsule study done  Plan IV iron with Venofer 200mg  weekly x 4 doses. Allergy reactions/infusion reaction including anaphylactic reaction discussed with patient. Other side effects include but not limited to high blood pressure, skin rash, weight gain, leg swelling, etc. Patient voices understanding and willing to proceed.  Orders Placed This Encounter  Procedures  . CBC with Differential/Platelet    Standing Status:   Future    Standing Expiration Date:   11/20/2018  . Ferritin    Standing Status:   Future    Standing Expiration Date:   11/20/2018  . Iron and TIBC    Standing Status:   Future    Standing Expiration Date:   11/20/2018    All questions were answered. The patient knows to call the clinic with any problems questions or concerns.  Return of visit: 2 months Thank you for this kind referral and the opportunity to participate in the care of this patient. A copy of today's note is routed to referring provider  Total face to face encounter time for this patient visit was 32min. >50% of the time was  spent in counseling and coordination of care.    Earlie Server, MD, PhD Hematology Oncology [facility] Pager-  [phone] 11/20/2017

## 2017-11-21 ENCOUNTER — Encounter: Payer: Self-pay | Admitting: Gastroenterology

## 2017-11-21 DIAGNOSIS — H348322 Tributary (branch) retinal vein occlusion, left eye, stable: Secondary | ICD-10-CM | POA: Diagnosis not present

## 2017-11-21 DIAGNOSIS — H34811 Central retinal vein occlusion, right eye, with macular edema: Secondary | ICD-10-CM | POA: Diagnosis not present

## 2017-11-24 ENCOUNTER — Inpatient Hospital Stay: Payer: Medicare Other

## 2017-11-24 ENCOUNTER — Telehealth: Payer: Self-pay

## 2017-11-24 ENCOUNTER — Other Ambulatory Visit: Payer: Self-pay

## 2017-11-24 VITALS — BP 143/71 | HR 79 | Temp 98.1°F | Resp 20

## 2017-11-24 DIAGNOSIS — K922 Gastrointestinal hemorrhage, unspecified: Secondary | ICD-10-CM | POA: Diagnosis not present

## 2017-11-24 DIAGNOSIS — I4891 Unspecified atrial fibrillation: Secondary | ICD-10-CM | POA: Diagnosis not present

## 2017-11-24 DIAGNOSIS — D500 Iron deficiency anemia secondary to blood loss (chronic): Secondary | ICD-10-CM

## 2017-11-24 DIAGNOSIS — R0602 Shortness of breath: Secondary | ICD-10-CM | POA: Diagnosis not present

## 2017-11-24 DIAGNOSIS — R5383 Other fatigue: Secondary | ICD-10-CM | POA: Diagnosis not present

## 2017-11-24 DIAGNOSIS — K921 Melena: Secondary | ICD-10-CM

## 2017-11-24 DIAGNOSIS — Z87891 Personal history of nicotine dependence: Secondary | ICD-10-CM | POA: Diagnosis not present

## 2017-11-24 DIAGNOSIS — D509 Iron deficiency anemia, unspecified: Secondary | ICD-10-CM

## 2017-11-24 MED ORDER — SODIUM CHLORIDE 0.9 % IV SOLN
Freq: Once | INTRAVENOUS | Status: AC
  Administered 2017-11-24: 14:00:00 via INTRAVENOUS
  Filled 2017-11-24: qty 250

## 2017-11-24 MED ORDER — IRON SUCROSE 20 MG/ML IV SOLN
200.0000 mg | Freq: Once | INTRAVENOUS | Status: AC
  Administered 2017-11-24: 200 mg via INTRAVENOUS
  Filled 2017-11-24: qty 10

## 2017-11-24 NOTE — Telephone Encounter (Signed)
Spoke with pt and informed him of his endoscopy biopsy results and Dr. Georgeann Oppenheim instructions to repeat colonoscopy in 6 months.  I have also informed pt of Dr. Georgeann Oppenheim instructions to schedule pt for a Capsule study of the small bowel. I have explained the prior prep instructions for this exam. Pt is aware that he'll receive printed instructions by mail.

## 2017-11-24 NOTE — Telephone Encounter (Signed)
-----   Message from Jonathon Bellows, MD sent at 11/21/2017  8:11 AM EST ----- [name] inform polyp was precancerous and as it was taken out in pieces suggest to repeat colonoscopy in 6 months.   C/c Elby Beck, FNP   Dr Jonathon Bellows MD,MRCP [facility]) Gastroenterology/Hepatology Pager: [phone]

## 2017-11-29 ENCOUNTER — Emergency Department
Admission: EM | Admit: 2017-11-29 | Discharge: 2017-11-29 | Disposition: A | Discharge status: Home / Self Care | Payer: Medicare Other | Attending: Emergency Medicine | Admitting: Emergency Medicine

## 2017-11-29 ENCOUNTER — Encounter: Payer: Self-pay | Admitting: Emergency Medicine

## 2017-11-29 ENCOUNTER — Other Ambulatory Visit: Payer: Self-pay

## 2017-11-29 ENCOUNTER — Emergency Department: Payer: Medicare Other

## 2017-11-29 DIAGNOSIS — I10 Essential (primary) hypertension: Secondary | ICD-10-CM | POA: Insufficient documentation

## 2017-11-29 DIAGNOSIS — R0602 Shortness of breath: Secondary | ICD-10-CM | POA: Diagnosis not present

## 2017-11-29 DIAGNOSIS — Z87891 Personal history of nicotine dependence: Secondary | ICD-10-CM | POA: Diagnosis not present

## 2017-11-29 DIAGNOSIS — Z7901 Long term (current) use of anticoagulants: Secondary | ICD-10-CM | POA: Insufficient documentation

## 2017-11-29 DIAGNOSIS — J069 Acute upper respiratory infection, unspecified: Secondary | ICD-10-CM | POA: Diagnosis not present

## 2017-11-29 DIAGNOSIS — Z7984 Long term (current) use of oral hypoglycemic drugs: Secondary | ICD-10-CM | POA: Diagnosis not present

## 2017-11-29 DIAGNOSIS — R05 Cough: Secondary | ICD-10-CM | POA: Diagnosis not present

## 2017-11-29 DIAGNOSIS — R0789 Other chest pain: Secondary | ICD-10-CM | POA: Diagnosis not present

## 2017-11-29 DIAGNOSIS — Z79899 Other long term (current) drug therapy: Secondary | ICD-10-CM | POA: Insufficient documentation

## 2017-11-29 DIAGNOSIS — E119 Type 2 diabetes mellitus without complications: Secondary | ICD-10-CM | POA: Insufficient documentation

## 2017-11-29 LAB — BRAIN NATRIURETIC PEPTIDE: B Natriuretic Peptide: 73 pg/mL (ref 0.0–100.0)

## 2017-11-29 LAB — BASIC METABOLIC PANEL
ANION GAP: 11 (ref 5–15)
BUN: 18 mg/dL (ref 8–23)
CALCIUM: 9.5 mg/dL (ref 8.9–10.3)
CO2: 23 mmol/L (ref 22–32)
Chloride: 104 mmol/L (ref 98–111)
Creatinine, Ser: 0.94 mg/dL (ref 0.61–1.24)
GFR calc non Af Amer: >60 mL/min (ref >60)
Glucose, Bld: 111 mg/dL — ABNORMAL HIGH (ref 70–99)
Potassium: 3.7 mmol/L (ref 3.5–5.1)
SODIUM: 138 mmol/L (ref 135–145)

## 2017-11-29 LAB — CBC WITH DIFFERENTIAL/PLATELET
ABS IMMATURE GRANULOCYTES: 0.04 10*3/uL (ref 0.00–0.07)
BASOS ABS: 0.1 10*3/uL (ref 0.0–0.1)
BASOS PCT: 1 %
EOS ABS: 0 10*3/uL (ref 0.0–0.5)
Eosinophils Relative: 0 %
HEMATOCRIT: 31.6 % — AB (ref 39.0–52.0)
HEMOGLOBIN: 9.4 g/dL — AB (ref 13.0–17.0)
Immature Granulocytes: 1 %
Lymphocytes Relative: 14 %
Lymphs Abs: 1.1 10*3/uL (ref 0.7–4.0)
MCH: 21.3 pg — AB (ref 26.0–34.0)
MCHC: 29.7 g/dL — AB (ref 30.0–36.0)
MCV: 71.7 fL — AB (ref 80.0–100.0)
Monocytes Absolute: 0.6 10*3/uL (ref 0.1–1.0)
Monocytes Relative: 8 %
NEUTROS PCT: 76 %
NRBC: 0 % (ref 0.0–0.2)
Neutro Abs: 6.2 10*3/uL (ref 1.7–7.7)
Platelets: 270 10*3/uL (ref 150–400)
RBC: 4.41 MIL/uL (ref 4.22–5.81)
RDW: 23.8 % — ABNORMAL HIGH (ref 11.5–15.5)
Smear Review: NORMAL
WBC: 8.1 10*3/uL (ref 4.0–10.5)

## 2017-11-29 LAB — TROPONIN I: Troponin I: <0.03 ng/mL (ref <0.03)

## 2017-11-29 MED ORDER — ACETAMINOPHEN 500 MG PO TABS: 1000.0000 mg | ORAL_TABLET | Freq: Three times a day (TID) | ORAL | 0 refills | Status: AC | PRN

## 2017-11-29 MED ORDER — BENZONATATE 100 MG PO CAPS: 100.0000 mg | ORAL_CAPSULE | Freq: Three times a day (TID) | ORAL | 0 refills | Status: DC | PRN

## 2017-11-29 MED ORDER — GUAIFENESIN 100 MG/5ML PO SOLN: 5.0000 mL | ORAL | 0 refills | Status: DC | PRN

## 2017-11-29 NOTE — ED Provider Notes (Signed)
[facility] Emergency Department Provider Note  ____________________________________________  Time seen: Approximately 12:25 PM  I have reviewed the triage vital signs and the nursing notes.   HISTORY  Chief Complaint Shortness of Breath    HPI Gabriel Brewer is a 77 y.o. male with a history of hypertension diabetes and GERD who complains of nonproductive cough and nasal congestion that started yesterday, gradually worsening until today.  Also has some bilateral frontal and maxillary pressure discomfort.  Also complains of tightness in the chest that is 1/10 in intensity, not pleuritic, not exertional, nonradiating.  Not associated with shortness of breath diaphoresis or vomiting.  Denies body aches vomiting or diarrhea.  Ate breakfast normally today.  Denies dizziness. Patient reports that he believes "I have a cold" and the reason he came today is that he felt like the symptoms accelerated very quickly compared to what he is used to for a viral URI.      Past Medical History:  Diagnosis Date  . Cancer ([place]) 2017   skin   . Diabetes mellitus without complication ([place])   . GERD (gastroesophageal reflux disease)   . Hyperlipidemia   . Hypertension      Patient Active Problem List   Diagnosis Date Noted  . Iron deficiency anemia 11/14/2017  . Elevated prostate specific antigen (PSA) 11/12/2017  . Esophageal reflux 11/12/2017  . Family history of malignant neoplasm of prostate 11/12/2017  . Low back pain 11/12/2017  . Vitamin D deficiency 11/12/2017  . Pneumonia 11/03/2016  . Endocarditis of mitral valve 09/27/2015  . Mitral regurgitation 09/27/2015  . Essential hypertension 08/18/2015  . Skin cancer of face 02/06/2015  . Mixed hyperlipidemia 10/22/2014     Past Surgical History:  Procedure Laterality Date  . COLONOSCOPY WITH PROPOFOL N/A 11/17/2017   Procedure: COLONOSCOPY WITH PROPOFOL;  Surgeon: Jonathon Bellows, MD;  Location: [facility] ENDOSCOPY;   Service: Gastroenterology;  Laterality: N/A;  . ESOPHAGOGASTRODUODENOSCOPY (EGD) WITH PROPOFOL N/A 11/17/2017   Procedure: ESOPHAGOGASTRODUODENOSCOPY (EGD) WITH PROPOFOL;  Surgeon: Jonathon Bellows, MD;  Location: [facility] ENDOSCOPY;  Service: Gastroenterology;  Laterality: N/A;  . TONSILLECTOMY       Prior to Admission medications   Medication Sig Start Date End Date Taking? Authorizing Provider  acetaminophen (TYLENOL) 500 MG tablet Take 2 tablets (1,000 mg total) by mouth every 8 (eight) hours as needed for up to 10 days. 11/29/17 12/09/17  Carrie Mew, MD  atorvastatin (LIPITOR) 10 MG tablet Take 1 tablet by mouth daily. 10/24/14   [provider]  benazepril (LOTENSIN) 40 MG tablet Take 1 tablet (40 mg total) by mouth every evening. 11/07/16   Gladstone Lighter, MD  benzonatate (TESSALON PERLES) 100 MG capsule Take 1 capsule (100 mg total) by mouth 3 (three) times daily as needed for cough. 11/29/17 11/29/18  Carrie Mew, MD  Cholecalciferol 4000 units CAPS Take 1 capsule by mouth every 7 (seven) days.    [provider]  diltiazem (CARDIZEM CD) 180 MG 24 hr capsule Take 1 capsule (180 mg total) by mouth daily. 11/07/16   Gladstone Lighter, MD  FLUAD 0.5 ML SUSY ADM 0.5ML IM UTD 10/14/17   [provider]  fluticasone (FLONASE) 50 MCG/ACT nasal spray as needed.  08/15/17   [provider]  gentamicin ointment (GARAMYCIN) 0.1 % daily as needed.  11/11/17   [provider]  glucose blood (ONE TOUCH ULTRA TEST) test strip TEST ONCE DAILY 12/09/16   [provider]  guaiFENesin (ROBITUSSIN) 100 MG/5ML SOLN Take  5 mLs (100 mg total) by mouth every 4 (four) hours as needed for cough or to loosen phlegm. 11/29/17   Carrie Mew, MD  metFORMIN (GLUCOPHAGE) 500 MG tablet Take 1 tablet (500 mg total) by mouth 2 (two) times daily. 11/12/17   Elby Beck, FNP  omeprazole (PRILOSEC) 40 MG capsule Take 1 capsule by mouth daily. 09/21/14    [provider]  rivaroxaban (XARELTO) 20 MG TABS tablet Take 1 tablet (20 mg total) by mouth daily with supper. 11/07/16   Gladstone Lighter, MD  traMADol (ULTRAM) 50 MG tablet Take 1 tablet (50 mg total) by mouth every 6 (six) hours as needed. Patient not taking: Reported on 11/14/2017 01/09/17   [name], PA-C     Allergies Patient has no known allergies.   Family History  Problem Relation Age of Onset  . Cerebral aneurysm Mother   . Heart disease Father   . COPD Father   . Breast cancer Sister   . Cancer Brother        unknowsn    Social History Social History   Tobacco Use  . Smoking status: Former Smoker    Packs/day: 0.50    Years: 20.00    Pack years: 10.00    Types: Cigarettes    Last attempt to quit: 11/20/1982    Years since quitting: 35.0  . Smokeless tobacco: Never Used  Substance Use Topics  . Alcohol use: No  . Drug use: No    Review of Systems  Constitutional:   No fever or chills.  ENT:   Positive sore throat.  Positive rhinorrhea. Cardiovascular:   Positive as above chest discomfort without syncope. Respiratory:   No dyspnea positive nonproductive cough. Gastrointestinal:   Negative for abdominal pain, vomiting and diarrhea.  Musculoskeletal:   Negative for focal pain or swelling All other systems reviewed and are negative except as documented above in ROS and HPI.  ____________________________________________   PHYSICAL EXAM:  VITAL SIGNS: ED Triage Vitals  Enc Vitals Group     BP 11/29/17 1027 (!) 144/65     Pulse Rate 11/29/17 1027 89     Resp 11/29/17 1027 18     Temp 11/29/17 1027 98 F (36.7 C)     Temp Source 11/29/17 1027 Oral     SpO2 11/29/17 1027 99 %     Weight 11/29/17 1025 172 lb 14.4 oz (78.4 kg)     Height 11/29/17 1025 5\' 11"  (1.803 m)     Head Circumference --      Peak Flow --      Pain Score 11/29/17 1025 2     Pain Loc --      Pain Edu? --      Excl. in [place]? --     Vital signs reviewed,  nursing assessments reviewed.   Constitutional:   Alert and oriented. Non-toxic appearance. Eyes:   Conjunctivae are normal. EOMI. PERRL. ENT      Head:   Normocephalic and atraumatic.      Nose:   Positive nasal congestion      Mouth/Throat:   MMM, mild pharyngeal erythema. No peritonsillar mass.       Neck:   No meningismus. Full ROM.  No midline tenderness. Hematological/Lymphatic/Immunilogical:   No cervical lymphadenopathy. Cardiovascular:   RRR. Symmetric bilateral radial and DP pulses.  No murmurs. Cap refill less than 2 seconds. Respiratory:   Normal respiratory effort without tachypnea/retractions. Breath sounds are clear and equal bilaterally. No  wheezes/rales/rhonchi.  No inducible wheezing or cough with FEV1 maneuver Gastrointestinal:   Soft and nontender. Non distended. There is no CVA tenderness.  No rebound, rigidity, or guarding.  Musculoskeletal:   Normal range of motion in all extremities. No joint effusions.  No lower extremity tenderness.  No edema. Neurologic:   Normal speech and language.  Motor grossly intact. No acute focal neurologic deficits are appreciated.  Skin:    Skin is warm, dry and intact. No rash noted.  No petechiae, purpura, or bullae.  ____________________________________________    LABS (pertinent positives/negatives) (all labs ordered are listed, but only abnormal results are displayed) Labs Reviewed  BASIC METABOLIC PANEL - Abnormal; Notable for the following components:      Result Value   Glucose, Bld 111 (*)    All other components within normal limits  CBC WITH DIFFERENTIAL/PLATELET - Abnormal; Notable for the following components:   Hemoglobin 9.4 (*)    HCT 31.6 (*)    MCV 71.7 (*)    MCH 21.3 (*)    MCHC 29.7 (*)    RDW 23.8 (*)    All other components within normal limits  TROPONIN I  BRAIN NATRIURETIC PEPTIDE   ____________________________________________   EKG  Interpreted by me Sinus rhythm rate of 74, normal axis  and intervals.  Normal QRS ST segments and T waves.  ____________________________________________    [id number]  Dg Chest 2 View  Result Date: 11/29/2017 CLINICAL DATA:  Cough and congestion EXAM: CHEST - 2 VIEW COMPARISON:  11/05/2016 FINDINGS: Cardiac shadows within normal limits. The lungs are well aerated bilaterally. Biapical scarring is again seen. No focal infiltrate or sizable effusion is noted. No acute bony abnormality is seen. IMPRESSION: No acute abnormality noted. Electronically Signed   By: Inez Catalina M.D.   On: 11/29/2017 11:36    ____________________________________________   PROCEDURES Procedures  ____________________________________________    CLINICAL IMPRESSION / ASSESSMENT AND PLAN / ED COURSE  Pertinent labs & imaging results that were available during my care of the patient were reviewed by me and considered in my medical decision making (see chart for details).    Patient presents with nasal congestion cough and vague chest discomfort that is very atypical.  Highly consistent with a URI.  Exam is very reassuring.  Labs and chest x-ray are essentially normal except for anemia which is improved compared to previous.  Patient does note that he recently had blood transfusion and is receiving iron infusions.  Considering the patient's symptoms, medical history, and physical examination today, I have low suspicion for ACS, PE, TAD, pneumothorax, carditis, mediastinitis, pneumonia, CHF, or sepsis.  Treat symptomatically with Mucinex, Tessalon, Tylenol.  Counseled to see his doctor or return to the ED if he has worsening symptoms including worsening chest pain shortness of breath or fever.        ____________________________________________   FINAL CLINICAL IMPRESSION(S) / ED DIAGNOSES    Final diagnoses:  Upper respiratory tract infection, unspecified type     ED Discharge Orders         Ordered    guaiFENesin (ROBITUSSIN) 100 MG/5ML SOLN  Every 4  hours PRN     11/29/17 1224    benzonatate (TESSALON PERLES) 100 MG capsule  3 times daily PRN     11/29/17 1224    acetaminophen (TYLENOL) 500 MG tablet  Every 8 hours PRN     11/29/17 1224          Portions of this note were generated  with [employer]. Dictation errors may occur despite best attempts at proofreading.    Carrie Mew, MD 11/29/17 1228

## 2017-11-29 NOTE — ED Triage Notes (Signed)
Here for URI type symptoms including prodcutive cough, congestion, and headache but also states "my chest feels heavy, its just not right".  States "I am not short of breath but it feels hard to get a breath". No fevers. VSS.

## 2017-12-01 ENCOUNTER — Inpatient Hospital Stay: Payer: Medicare Other

## 2017-12-01 VITALS — BP 145/68 | HR 67 | Temp 96.4°F | Resp 18

## 2017-12-01 DIAGNOSIS — I4891 Unspecified atrial fibrillation: Secondary | ICD-10-CM | POA: Diagnosis not present

## 2017-12-01 DIAGNOSIS — R0602 Shortness of breath: Secondary | ICD-10-CM | POA: Diagnosis not present

## 2017-12-01 DIAGNOSIS — Z87891 Personal history of nicotine dependence: Secondary | ICD-10-CM | POA: Diagnosis not present

## 2017-12-01 DIAGNOSIS — K922 Gastrointestinal hemorrhage, unspecified: Secondary | ICD-10-CM | POA: Diagnosis not present

## 2017-12-01 DIAGNOSIS — R5383 Other fatigue: Secondary | ICD-10-CM | POA: Diagnosis not present

## 2017-12-01 DIAGNOSIS — D500 Iron deficiency anemia secondary to blood loss (chronic): Secondary | ICD-10-CM

## 2017-12-01 MED ORDER — IRON SUCROSE 20 MG/ML IV SOLN
200.0000 mg | Freq: Once | INTRAVENOUS | Status: AC
  Administered 2017-12-01: 200 mg via INTRAVENOUS
  Filled 2017-12-01: qty 10

## 2017-12-01 MED ORDER — SODIUM CHLORIDE 0.9 % IV SOLN
Freq: Once | INTRAVENOUS | Status: AC
  Administered 2017-12-01: 14:00:00 via INTRAVENOUS
  Filled 2017-12-01: qty 250

## 2017-12-03 ENCOUNTER — Other Ambulatory Visit: Payer: Self-pay

## 2017-12-03 ENCOUNTER — Ambulatory Visit (INDEPENDENT_AMBULATORY_CARE_PROVIDER_SITE_OTHER): Payer: Medicare Other | Admitting: Gastroenterology

## 2017-12-03 ENCOUNTER — Encounter: Payer: Self-pay | Admitting: Gastroenterology

## 2017-12-03 VITALS — BP 135/67 | HR 69 | Ht 71.0 in | Wt 172.8 lb

## 2017-12-03 DIAGNOSIS — D509 Iron deficiency anemia, unspecified: Secondary | ICD-10-CM | POA: Diagnosis not present

## 2017-12-03 DIAGNOSIS — E538 Deficiency of other specified B group vitamins: Secondary | ICD-10-CM

## 2017-12-03 NOTE — Addendum Note (Signed)
Addended by: Dorethea Clan on: 12/03/2017 08:44 AM   Modules accepted: Orders

## 2017-12-03 NOTE — Progress Notes (Signed)
Gabriel Bellows MD, MRCP(U.K) [address]  [place]  [place], [place] [address]  Main: [phone]  Fax: [phone]   Primary Care Physician: Elby Beck, FNP  Primary Gastroenterologist:  Gabriel. Jonathon Brewer   Chief Complaint  Patient presents with  . Follow-up    Anemia, melena    HPI: Gabriel Brewer is a 77 y.o. male   Summary of history : He is here today to follow up to his initial visit on 11/13/17 when he was referred for anemia, he had been on xarelto and had been having melena for a few weeks.Hemoglobin performed on 11/12/2017 showed a hemoglobin of 7.2 g with an MCV of 65.6.  Going back 2 years in 2017 I also note that he was microcytic at that point of time...    Interval history   11/13/2017-  12/03/2017 Had a unit of PRBC's transfused Hb 9.4 on 11/29/17  11/13/17: Ferritin 6 and iron % sat 2, no blood in urine , celiac serology negative  Received IV iron with hematology  11/17/17 : EGD : 3 cm hiatal hernia. Colonoscopy 15 cc cecal polyp resected. (tubulovillous adenoma)   He has brown stool, on xarelto  Current Outpatient Medications  Medication Sig Dispense Refill  . acetaminophen (TYLENOL) 500 MG tablet Take 2 tablets (1,000 mg total) by mouth every 8 (eight) hours as needed for up to 10 days. 60 tablet 0  . atorvastatin (LIPITOR) 10 MG tablet Take 1 tablet by mouth daily.    . benazepril (LOTENSIN) 40 MG tablet Take 1 tablet (40 mg total) by mouth every evening. 30 tablet 2  . benzonatate (TESSALON PERLES) 100 MG capsule Take 1 capsule (100 mg total) by mouth 3 (three) times daily as needed for cough. 30 capsule 0  . Cholecalciferol 4000 units CAPS Take 1 capsule by mouth every 7 (seven) days.    Marland Kitchen diltiazem (CARDIZEM CD) 180 MG 24 hr capsule Take 1 capsule (180 mg total) by mouth daily. 30 capsule 2  . FLUAD 0.5 ML SUSY ADM 0.5ML IM UTD  0  . fluticasone (FLONASE) 50 MCG/ACT nasal spray as needed.     Marland Kitchen gentamicin ointment (GARAMYCIN) 0.1 % daily as  needed.     Marland Kitchen glucose blood (ONE TOUCH ULTRA TEST) test strip TEST ONCE DAILY    . metFORMIN (GLUCOPHAGE) 500 MG tablet Take 1 tablet (500 mg total) by mouth 2 (two) times daily. 180 tablet 1  . omeprazole (PRILOSEC) 40 MG capsule Take 1 capsule by mouth daily.    . rivaroxaban (XARELTO) 20 MG TABS tablet Take 1 tablet (20 mg total) by mouth daily with supper. 30 tablet 2  . guaiFENesin (ROBITUSSIN) 100 MG/5ML SOLN Take 5 mLs (100 mg total) by mouth every 4 (four) hours as needed for cough or to loosen phlegm. (Patient not taking: Reported on 12/03/2017) 120 mL 0  . traMADol (ULTRAM) 50 MG tablet Take 1 tablet (50 mg total) by mouth every 6 (six) hours as needed. (Patient not taking: Reported on 11/14/2017) 20 tablet 0   Current Facility-Administered Medications  Medication Dose Route Frequency Provider Last Rate Last Dose  . 0.9 %  sodium chloride infusion (Manually program via Guardrails IV Fluids)   Intravenous Once Gabriel Bellows, MD        Allergies as of 12/03/2017  . (No Known Allergies)    ROS:  General: Negative for anorexia, weight loss, fever, chills, fatigue, weakness. ENT: Negative for hoarseness, difficulty swallowing , nasal congestion. CV:  Negative for chest pain, angina, palpitations, dyspnea on exertion, peripheral edema.  Respiratory: Negative for dyspnea at rest, dyspnea on exertion, cough, sputum, wheezing.  GI: See history of present illness. GU:  Negative for dysuria, hematuria, urinary incontinence, urinary frequency, nocturnal urination.  Endo: Negative for unusual weight change.    Physical Examination:   BP 135/67   Pulse 69   Ht 5\' 11"  (1.803 m)   Wt 172 lb 12.8 oz (78.4 kg)   BMI 24.10 kg/m   General: Well-nourished, well-developed in no acute distress.  Eyes: No icterus. Conjunctivae pink. Mouth: Oropharyngeal mucosa moist and pink , no lesions erythema or exudate. Lungs: Clear to auscultation bilaterally. Non-labored. Heart: Regular rate and rhythm,  no murmurs rubs or gallops.  Abdomen: Bowel sounds are normal, nontender, nondistended, no hepatosplenomegaly or masses, no abdominal bruits or hernia , no rebound or guarding.   Extremities: No lower extremity edema. No clubbing or deformities. Neuro: Alert and oriented x 3.  Grossly intact. Skin: Warm and dry, no jaundice.   Psych: Alert and cooperative, normal mood and affect.   Imaging Studies: Dg Chest 2 View  Result Date: 11/29/2017 CLINICAL DATA:  Cough and congestion EXAM: CHEST - 2 VIEW COMPARISON:  11/05/2016 FINDINGS: Cardiac shadows within normal limits. The lungs are well aerated bilaterally. Biapical scarring is again seen. No focal infiltrate or sizable effusion is noted. No acute bony abnormality is seen. IMPRESSION: No acute abnormality noted. Electronically Signed   By: Inez Catalina M.D.   On: 11/29/2017 11:36    Assessment and Plan:   Gabriel Brewer is a 77 y.o. y/o male here to follow up for iron deficiency anemia.   Has been on Xarelto.  On IV iron , large cecal polyp resected  Plan 1.  colonoscopy in 6 months as polyp resected piece meal  2. Check B12 levels 3. Capsule study of the small bowel   Risks, benefits, alternatives of Givens capsule discussed with patient to include but not limited to the rare risk of Given's capsule becoming lodged in the GI tract requiring surgical removal.  The patient agrees with this plan & consent will be obtained.    Gabriel Gabriel Bellows  MD,MRCP [facility]) Follow up in 4-6 weeks

## 2017-12-04 LAB — B12 AND FOLATE PANEL
FOLATE: 5.4 ng/mL (ref >3.0)
VITAMIN B 12: 455 pg/mL (ref 232–1245)

## 2017-12-08 ENCOUNTER — Inpatient Hospital Stay: Payer: Medicare Other

## 2017-12-08 VITALS — BP 134/68 | HR 71

## 2017-12-08 DIAGNOSIS — D500 Iron deficiency anemia secondary to blood loss (chronic): Secondary | ICD-10-CM

## 2017-12-08 DIAGNOSIS — R5383 Other fatigue: Secondary | ICD-10-CM | POA: Diagnosis not present

## 2017-12-08 DIAGNOSIS — K922 Gastrointestinal hemorrhage, unspecified: Secondary | ICD-10-CM | POA: Diagnosis not present

## 2017-12-08 DIAGNOSIS — R0602 Shortness of breath: Secondary | ICD-10-CM | POA: Diagnosis not present

## 2017-12-08 DIAGNOSIS — I4891 Unspecified atrial fibrillation: Secondary | ICD-10-CM | POA: Diagnosis not present

## 2017-12-08 DIAGNOSIS — Z87891 Personal history of nicotine dependence: Secondary | ICD-10-CM | POA: Diagnosis not present

## 2017-12-08 MED ORDER — SODIUM CHLORIDE 0.9 % IV SOLN
Freq: Once | INTRAVENOUS | Status: AC
  Administered 2017-12-08: 14:00:00 via INTRAVENOUS
  Filled 2017-12-08: qty 250

## 2017-12-08 MED ORDER — IRON SUCROSE 20 MG/ML IV SOLN
200.0000 mg | Freq: Once | INTRAVENOUS | Status: AC
  Administered 2017-12-08: 200 mg via INTRAVENOUS
  Filled 2017-12-08: qty 10

## 2017-12-09 ENCOUNTER — Ambulatory Visit
Admission: RE | Admit: 2017-12-09 | Discharge: 2017-12-09 | Disposition: A | Discharge status: Home / Self Care | Payer: Medicare Other | Source: Ambulatory Visit | Attending: Gastroenterology | Admitting: Gastroenterology

## 2017-12-09 ENCOUNTER — Encounter: Payer: Self-pay | Admitting: Certified Registered Nurse Anesthetist

## 2017-12-09 ENCOUNTER — Encounter
Admission: RE | Disposition: A | Discharge status: Home / Self Care | Payer: Self-pay | Source: Ambulatory Visit | Attending: Gastroenterology

## 2017-12-09 DIAGNOSIS — D509 Iron deficiency anemia, unspecified: Secondary | ICD-10-CM | POA: Insufficient documentation

## 2017-12-09 DIAGNOSIS — K31819 Angiodysplasia of stomach and duodenum without bleeding: Secondary | ICD-10-CM | POA: Insufficient documentation

## 2017-12-09 HISTORY — PX: GIVENS CAPSULE STUDY: SHX5432

## 2017-12-09 SURGERY — IMAGING PROCEDURE, GI TRACT, INTRALUMINAL, VIA CAPSULE

## 2017-12-10 ENCOUNTER — Encounter: Payer: Self-pay | Admitting: Gastroenterology

## 2017-12-15 ENCOUNTER — Encounter: Payer: Self-pay | Admitting: Gastroenterology

## 2017-12-19 ENCOUNTER — Encounter: Payer: Self-pay | Admitting: Gastroenterology

## 2017-12-31 DIAGNOSIS — I340 Nonrheumatic mitral (valve) insufficiency: Secondary | ICD-10-CM | POA: Diagnosis not present

## 2017-12-31 DIAGNOSIS — I480 Paroxysmal atrial fibrillation: Secondary | ICD-10-CM | POA: Diagnosis not present

## 2017-12-31 DIAGNOSIS — I10 Essential (primary) hypertension: Secondary | ICD-10-CM | POA: Diagnosis not present

## 2018-01-09 DIAGNOSIS — H34811 Central retinal vein occlusion, right eye, with macular edema: Secondary | ICD-10-CM | POA: Diagnosis not present

## 2018-01-09 DIAGNOSIS — H348322 Tributary (branch) retinal vein occlusion, left eye, stable: Secondary | ICD-10-CM | POA: Diagnosis not present

## 2018-01-22 ENCOUNTER — Other Ambulatory Visit: Payer: Self-pay | Admitting: Oncology

## 2018-01-22 ENCOUNTER — Inpatient Hospital Stay: Payer: Medicare Other | Attending: Oncology

## 2018-01-22 DIAGNOSIS — K922 Gastrointestinal hemorrhage, unspecified: Secondary | ICD-10-CM | POA: Insufficient documentation

## 2018-01-22 DIAGNOSIS — D500 Iron deficiency anemia secondary to blood loss (chronic): Secondary | ICD-10-CM | POA: Diagnosis not present

## 2018-01-22 LAB — IRON AND TIBC
IRON: 17 ug/dL — AB (ref 45–182)
Saturation Ratios: 4 % — ABNORMAL LOW (ref 17.9–39.5)
TIBC: 391 ug/dL (ref 250–450)
UIBC: 374 ug/dL

## 2018-01-22 LAB — CBC WITH DIFFERENTIAL/PLATELET
ABS IMMATURE GRANULOCYTES: 0.02 10*3/uL (ref 0.00–0.07)
BASOS PCT: 1 %
Basophils Absolute: 0.1 10*3/uL (ref 0.0–0.1)
Eosinophils Absolute: 0.2 10*3/uL (ref 0.0–0.5)
Eosinophils Relative: 2 %
HCT: 38.8 % — ABNORMAL LOW (ref 39.0–52.0)
HEMOGLOBIN: 12 g/dL — AB (ref 13.0–17.0)
IMMATURE GRANULOCYTES: 0 %
Lymphocytes Relative: 35 %
Lymphs Abs: 2.3 10*3/uL (ref 0.7–4.0)
MCH: 22.6 pg — ABNORMAL LOW (ref 26.0–34.0)
MCHC: 30.9 g/dL (ref 30.0–36.0)
MCV: 72.9 fL — AB (ref 80.0–100.0)
MONOS PCT: 9 %
Monocytes Absolute: 0.6 10*3/uL (ref 0.1–1.0)
NEUTROS PCT: 53 %
NRBC: 0 % (ref 0.0–0.2)
Neutro Abs: 3.4 10*3/uL (ref 1.7–7.7)
PLATELETS: 254 10*3/uL (ref 150–400)
RBC: 5.32 MIL/uL (ref 4.22–5.81)
RDW: 21.5 % — ABNORMAL HIGH (ref 11.5–15.5)
WBC: 6.6 10*3/uL (ref 4.0–10.5)

## 2018-01-22 LAB — FERRITIN: FERRITIN: 10 ng/mL — AB (ref 24–336)

## 2018-01-23 ENCOUNTER — Inpatient Hospital Stay (HOSPITAL_BASED_OUTPATIENT_CLINIC_OR_DEPARTMENT_OTHER): Payer: Medicare Other | Admitting: Oncology

## 2018-01-23 ENCOUNTER — Inpatient Hospital Stay: Payer: Medicare Other

## 2018-01-23 ENCOUNTER — Other Ambulatory Visit: Payer: Self-pay

## 2018-01-23 ENCOUNTER — Encounter: Payer: Self-pay | Admitting: Oncology

## 2018-01-23 VITALS — BP 150/80 | HR 65 | Resp 18

## 2018-01-23 VITALS — BP 155/84 | HR 74 | Temp 96.9°F | Resp 18 | Wt 174.3 lb

## 2018-01-23 DIAGNOSIS — D500 Iron deficiency anemia secondary to blood loss (chronic): Secondary | ICD-10-CM

## 2018-01-23 DIAGNOSIS — K922 Gastrointestinal hemorrhage, unspecified: Secondary | ICD-10-CM | POA: Diagnosis not present

## 2018-01-23 MED ORDER — SODIUM CHLORIDE 0.9 % IV SOLN
Freq: Once | INTRAVENOUS | Status: AC
  Administered 2018-01-23: 14:00:00 via INTRAVENOUS
  Filled 2018-01-23: qty 250

## 2018-01-23 MED ORDER — IRON SUCROSE 20 MG/ML IV SOLN
200.0000 mg | Freq: Once | INTRAVENOUS | Status: AC
  Administered 2018-01-23: 200 mg via INTRAVENOUS
  Filled 2018-01-23: qty 10

## 2018-01-23 MED ORDER — SODIUM CHLORIDE 0.9 % IV SOLN: 200.0000 mg | INTRAVENOUS | Status: DC

## 2018-01-23 NOTE — Progress Notes (Signed)
Patient here for follow up. No concerns voiced.  °

## 2018-01-23 NOTE — Progress Notes (Signed)
Hematology/Oncology follow up  note [facility] Telephone:(336) [phone] Fax:(336) [phone]   Patient Care Team: Elby Beck, FNP as PCP - General (Nurse Practitioner)  REFERRING PROVIDER: Napoleon Form CHIEF COMPLAINTS/REASON FOR VISIT:  Evaluation of iron deficiency anemia  HISTORY OF PRESENTING ILLNESS:  Gabriel Brewer is a  78 y.o.  male with PMH listed below who was referred to me for evaluation of iron deficiency anemia.  [name] for evaluation of GI bleeding.  Patient is on chronic anticoagulation Xarelto for atrial fibrillation. He noticed having intermittent black stools for the past couple of weeks. 11/12/2017, CBC showed hemoglobin 7.2, MCV 65.6. Reviewed patient previous labs.  Since March 2016, he has started to have mild anemia with MCV of 79.8.  History of skin cancer in 2017.  History of nonrheumatic mitral regurgitation and history of encarditis mitral valve.    He is s/p 1 unit of PRBC transfusion last week. Feels fatigue is slightly better. Mild SOB with exertion.  11/17/2017 endoscopy and colonoscopy showed 3 cm hiatal hernia, mucosal changes in the esophagus.  Nonbleeding internal hemorrhoids, diverticulosis in the sigmoid colon.  150 mm polyp in the cecum, removed with mucosal resection.  Examination otherwise normal. There is plan for video capsule endoscopy in 2 weeks.  Patient was referred to hematology for discussion of future management plan for iron deficiency anemia.   INTERVAL HISTORY Gabriel Brewer is a 78 y.o. male who has above history reviewed by me today presents for follow up visit for management of iron deficiency anemia Status post IV Venofer 200 mg weekly x4. Reports fatigue has improved a lot, not to baseline yet Denies hematochezia, hematuria, hematemesis, epistaxis, black tarry stool or easy bruising.  12/17/2017 underwent capsule study which showed a small proximal small bowel AVM, nonbleeding  noted.  Was suggested to have push enteroscopy to ablate the AVM.   Review of Systems  Constitutional: Positive for malaise/fatigue. Negative for chills, fever and weight loss.  HENT: Negative for nosebleeds and sore throat.   Eyes: Negative for double vision, photophobia and redness.  Respiratory: Negative for cough, shortness of breath and wheezing.   Cardiovascular: Negative for chest pain, palpitations and orthopnea.  Gastrointestinal: Negative for abdominal pain, melena, nausea and vomiting.  Genitourinary: Negative for dysuria.  Musculoskeletal: Negative for back pain, myalgias and neck pain.  Skin: Negative for itching and rash.  Neurological: Negative for dizziness, tingling and tremors.  Endo/Heme/Allergies: Negative for environmental allergies. Does not bruise/bleed easily.  Psychiatric/Behavioral: Negative for depression.    MEDICAL HISTORY:  Past Medical History:  Diagnosis Date  . Cancer ([place]) 2017   skin   . Diabetes mellitus without complication ([place])   . GERD (gastroesophageal reflux disease)   . Hyperlipidemia   . Hypertension     SURGICAL HISTORY: Past Surgical History:  Procedure Laterality Date  . COLONOSCOPY WITH PROPOFOL N/A 11/17/2017   Procedure: COLONOSCOPY WITH PROPOFOL;  Surgeon: Jonathon Bellows, MD;  [facility] END[facility]COPY;  Service: Gastroenterology;  Laterality: N/A;  . ESOPHAGOGASTRODUODENOSCOPY (EGD) WITH PROPOFOL N/A 11/17/2017   Procedure: ESOPHAGOGASTRODUODENOSCOPY (EGD) WITH PROPOFOL;  Surgeon: Jonathon Bellows, MD;  Location: [facility] ENDOSCOPY;  Service: Gastroenterology;  Laterality: N/A;  . GIVENS CAPSULE STUDY N/A 12/09/2017   Procedure: GIVENS CAPSULE STUDY;  Surgeon: Jonathon Bellows, MD;  Location: [facility] ENDOSCOPY;  Service: Gastroenterology;  Laterality: N/A;  . TONSILLECTOMY      SOCIAL HISTORY: Social History   Socioeconomic History  . Marital status: Married  Spouse name: Not on file  . Number of children: Not on file  . Years of  education: Not on file  . Highest education level: Not on file  Occupational History  . Occupation: retired  Scientific laboratory technician  . Financial resource strain: Not on file  . Food insecurity:    Worry: Not on file    Inability: Not on file  . Transportation needs:    Medical: Not on file    Non-medical: Not on file  Tobacco Use  . Smoking status: Former Smoker    Packs/day: 0.50    Years: 20.00    Pack years: 10.00    Types: Cigarettes    Last attempt to quit: 11/20/1982    Years since quitting: 35.2  . Smokeless tobacco: Never Used  Substance and Sexual Activity  . Alcohol use: No  . Drug use: No  . Sexual activity: Not Currently    Partners: Female  Lifestyle  . Physical activity:    Days per week: Not on file    Minutes per session: Not on file  . Stress: Not on file  Relationships  . Social connections:    Talks on phone: Not on file    Gets together: Not on file    Attends religious service: Not on file    Active member of club or organization: Not on file    Attends meetings of clubs or organizations: Not on file    Relationship status: Not on file  . Intimate partner violence:    Fear of current or ex partner: Not on file    Emotionally abused: Not on file    Physically abused: Not on file    Forced sexual activity: Not on file  Other Topics Concern  . Not on file  Social History Narrative  . Not on file    FAMILY HISTORY: Family History  Problem Relation Age of Onset  . Cerebral aneurysm Mother   . Heart disease Father   . COPD Father   . Breast cancer Sister   . Cancer Brother        unknowsn    ALLERGIES:  has No Known Allergies.  MEDICATIONS:  Current Outpatient Medications  Medication Sig Dispense Refill  . atorvastatin (LIPITOR) 10 MG tablet Take 1 tablet by mouth daily.    . benazepril (LOTENSIN) 40 MG tablet Take 1 tablet (40 mg total) by mouth every evening. 30 tablet 2  . benzonatate (TESSALON PERLES) 100 MG capsule Take 1 capsule (100 mg  total) by mouth 3 (three) times daily as needed for cough. 30 capsule 0  . Cholecalciferol 4000 units CAPS Take 1 capsule by mouth every 7 (seven) days.    Marland Kitchen diltiazem (CARDIZEM CD) 180 MG 24 hr capsule Take 1 capsule (180 mg total) by mouth daily. 30 capsule 2  . FLUAD 0.5 ML SUSY ADM 0.5ML IM UTD  0  . fluticasone (FLONASE) 50 MCG/ACT nasal spray as needed.     Marland Kitchen gentamicin ointment (GARAMYCIN) 0.1 % daily as needed.     Marland Kitchen glucose blood (ONE TOUCH ULTRA TEST) test strip TEST ONCE DAILY    . guaiFENesin (ROBITUSSIN) 100 MG/5ML SOLN Take 5 mLs (100 mg total) by mouth every 4 (four) hours as needed for cough or to loosen phlegm. (Patient not taking: Reported on 12/03/2017) 120 mL 0  . metFORMIN (GLUCOPHAGE) 500 MG tablet Take 1 tablet (500 mg total) by mouth 2 (two) times daily. 180 tablet 1  . omeprazole (PRILOSEC)  40 MG capsule Take 1 capsule by mouth daily.    . rivaroxaban (XARELTO) 20 MG TABS tablet Take 1 tablet (20 mg total) by mouth daily with supper. 30 tablet 2  . traMADol (ULTRAM) 50 MG tablet Take 1 tablet (50 mg total) by mouth every 6 (six) hours as needed. (Patient not taking: Reported on 11/14/2017) 20 tablet 0   Current Facility-Administered Medications  Medication Dose Route Frequency Provider Last Rate Last Dose  . 0.9 %  sodium chloride infusion (Manually program via Guardrails IV Fluids)   Intravenous Once Jonathon Bellows, MD         PHYSICAL EXAMINATION: ECOG PERFORMANCE STATUS: 1 - Symptomatic but completely ambulatory Vitals:   01/23/18 1326  BP: (!) 155/84  Pulse: 74  Resp: 18  Temp: (!) 96.9 F (36.1 C)   Filed Weights   01/23/18 1326  Weight: 174 lb 4.8 oz (79.1 kg)    Physical Exam Constitutional:      General: He is not in acute distress. HENT:     Head: Normocephalic and atraumatic.  Eyes:     General: No scleral icterus.    Pupils: Pupils are equal, round, and reactive to light.  Neck:     Musculoskeletal: Normal range of motion and neck supple.    Cardiovascular:     Rate and Rhythm: Normal rate and regular rhythm.     Heart sounds: Normal heart sounds.  Pulmonary:     Effort: Pulmonary effort is normal. No respiratory distress.     Breath sounds: No wheezing.  Abdominal:     General: Bowel sounds are normal. There is no distension.     Palpations: Abdomen is soft. There is no mass.     Tenderness: There is no abdominal tenderness.  Musculoskeletal: Normal range of motion.        General: No deformity.  Skin:    General: Skin is warm and dry.     Findings: No erythema or rash.  Neurological:     Mental Status: He is alert and oriented to person, place, and time.     Cranial Nerves: No cranial nerve deficit.     Coordination: Coordination normal.  Psychiatric:        Behavior: Behavior normal.        Thought Content: Thought content normal.      LABORATORY DATA:  I have reviewed the data as listed Lab Results  Component Value Date   WBC 6.6 01/22/2018   HGB 12.0 (L) 01/22/2018   HCT 38.8 (L) 01/22/2018   MCV 72.9 (L) 01/22/2018   PLT 254 01/22/2018   Recent Labs    11/12/17 1151 11/29/17 1104  NA 139 138  K 4.1 3.7  CL 105 104  CO2 23 23  GLUCOSE 112* 111*  BUN 22 18  CREATININE 1.00 0.94  CALCIUM 9.5 9.5  GFRNONAA  --  >60  GFRAA  --  >60  PROT 6.8  --   ALBUMIN 4.5  --   AST 15  --   ALT 11  --   ALKPHOS 55  --   BILITOT 0.5  --    Iron/TIBC/Ferritin/ %Sat    Component Value Date/Time   IRON 17 (L) 01/22/2018 1300   IRON 8 (LL) 11/13/2017 1415   TIBC 391 01/22/2018 1300   TIBC 395 11/13/2017 1415   FERRITIN 10 (L) 01/22/2018 1300   FERRITIN 5 (L) 11/13/2017 1415   IRONPCTSAT 4 (L) 01/22/2018 1300   IRONPCTSAT 2 (LL)  11/13/2017 1415       ASSESSMENT & PLAN:  1. Iron deficiency anemia due to chronic blood loss   Labs reviewed and discussed with patient. Hemoglobin has improved to 12.  MCV improved to 72.9, still microcytic. 01/22/2018 iron level showed TIBC 391, saturation ratio 4,  ferritin 10. I recommend additional IV Venofer 200 mg every 2 weeks x 3  Recommend patient to follow-up with gastroenterology for push enteroscopy for ablation of AVM. Orders Placed This Encounter  Procedures  . Iron and TIBC    Standing Status:   Future    Standing Expiration Date:   01/24/2019  . CBC with Differential/Platelet    Standing Status:   Future    Standing Expiration Date:   01/24/2019  . Ferritin    Standing Status:   Future    Standing Expiration Date:   01/24/2019    All questions were answered. The patient knows to call the clinic with any problems questions or concerns.  Return of visit: 4 months     Earlie Server, MD, PhD Hematology Oncology [facility] Pager- [phone] 01/23/2018

## 2018-02-03 ENCOUNTER — Inpatient Hospital Stay: Payer: Medicare Other

## 2018-02-03 VITALS — BP 123/76 | HR 78 | Temp 98.6°F | Resp 18

## 2018-02-03 DIAGNOSIS — D2271 Melanocytic nevi of right lower limb, including hip: Secondary | ICD-10-CM | POA: Diagnosis not present

## 2018-02-03 DIAGNOSIS — K922 Gastrointestinal hemorrhage, unspecified: Secondary | ICD-10-CM | POA: Diagnosis not present

## 2018-02-03 DIAGNOSIS — Z08 Encounter for follow-up examination after completed treatment for malignant neoplasm: Secondary | ICD-10-CM | POA: Diagnosis not present

## 2018-02-03 DIAGNOSIS — L821 Other seborrheic keratosis: Secondary | ICD-10-CM | POA: Diagnosis not present

## 2018-02-03 DIAGNOSIS — Z85828 Personal history of other malignant neoplasm of skin: Secondary | ICD-10-CM | POA: Diagnosis not present

## 2018-02-03 DIAGNOSIS — D225 Melanocytic nevi of trunk: Secondary | ICD-10-CM | POA: Diagnosis not present

## 2018-02-03 DIAGNOSIS — D500 Iron deficiency anemia secondary to blood loss (chronic): Secondary | ICD-10-CM

## 2018-02-03 DIAGNOSIS — D2272 Melanocytic nevi of left lower limb, including hip: Secondary | ICD-10-CM | POA: Diagnosis not present

## 2018-02-03 DIAGNOSIS — D2261 Melanocytic nevi of right upper limb, including shoulder: Secondary | ICD-10-CM | POA: Diagnosis not present

## 2018-02-03 DIAGNOSIS — X32XXXA Exposure to sunlight, initial encounter: Secondary | ICD-10-CM | POA: Diagnosis not present

## 2018-02-03 DIAGNOSIS — D2262 Melanocytic nevi of left upper limb, including shoulder: Secondary | ICD-10-CM | POA: Diagnosis not present

## 2018-02-03 DIAGNOSIS — L570 Actinic keratosis: Secondary | ICD-10-CM | POA: Diagnosis not present

## 2018-02-03 DIAGNOSIS — Z872 Personal history of diseases of the skin and subcutaneous tissue: Secondary | ICD-10-CM | POA: Diagnosis not present

## 2018-02-03 MED ORDER — IRON SUCROSE 20 MG/ML IV SOLN
200.0000 mg | Freq: Once | INTRAVENOUS | Status: AC
  Administered 2018-02-03: 200 mg via INTRAVENOUS
  Filled 2018-02-03: qty 10

## 2018-02-03 MED ORDER — SODIUM CHLORIDE 0.9 % IV SOLN
Freq: Once | INTRAVENOUS | Status: AC
  Administered 2018-02-03: 14:00:00 via INTRAVENOUS
  Filled 2018-02-03: qty 250

## 2018-02-17 ENCOUNTER — Inpatient Hospital Stay: Payer: Medicare Other | Attending: Oncology

## 2018-02-17 DIAGNOSIS — K922 Gastrointestinal hemorrhage, unspecified: Secondary | ICD-10-CM | POA: Insufficient documentation

## 2018-02-17 DIAGNOSIS — Z79899 Other long term (current) drug therapy: Secondary | ICD-10-CM | POA: Diagnosis not present

## 2018-02-17 DIAGNOSIS — D500 Iron deficiency anemia secondary to blood loss (chronic): Secondary | ICD-10-CM | POA: Diagnosis not present

## 2018-02-17 MED ORDER — SODIUM CHLORIDE 0.9 % IV SOLN
INTRAVENOUS | Status: DC
  Administered 2018-02-17: 14:00:00 via INTRAVENOUS
  Filled 2018-02-17: qty 250

## 2018-02-17 MED ORDER — IRON SUCROSE 20 MG/ML IV SOLN
200.0000 mg | Freq: Once | INTRAVENOUS | Status: AC
  Administered 2018-02-17: 200 mg via INTRAVENOUS
  Filled 2018-02-17: qty 10

## 2018-02-27 DIAGNOSIS — H348322 Tributary (branch) retinal vein occlusion, left eye, stable: Secondary | ICD-10-CM | POA: Diagnosis not present

## 2018-02-27 DIAGNOSIS — H34811 Central retinal vein occlusion, right eye, with macular edema: Secondary | ICD-10-CM | POA: Diagnosis not present

## 2018-02-27 DIAGNOSIS — E113293 Type 2 diabetes mellitus with mild nonproliferative diabetic retinopathy without macular edema, bilateral: Secondary | ICD-10-CM | POA: Diagnosis not present

## 2018-02-27 DIAGNOSIS — H35372 Puckering of macula, left eye: Secondary | ICD-10-CM | POA: Diagnosis not present

## 2018-03-04 ENCOUNTER — Ambulatory Visit (INDEPENDENT_AMBULATORY_CARE_PROVIDER_SITE_OTHER): Payer: Medicare Other | Admitting: Family Medicine

## 2018-03-04 ENCOUNTER — Encounter: Payer: Self-pay | Admitting: Family Medicine

## 2018-03-04 VITALS — BP 140/84 | HR 94 | Temp 97.6°F | Ht 71.5 in | Wt 174.2 lb

## 2018-03-04 DIAGNOSIS — M79602 Pain in left arm: Secondary | ICD-10-CM | POA: Diagnosis not present

## 2018-03-04 MED ORDER — ATORVASTATIN CALCIUM 10 MG PO TABS: 10.0000 mg | ORAL_TABLET | Freq: Every day | ORAL | 3 refills | Status: DC

## 2018-03-04 MED ORDER — PREDNISONE 10 MG PO TABS: ORAL_TABLET | ORAL | 0 refills | Status: DC

## 2018-03-04 NOTE — Patient Instructions (Signed)
Good to see you today  I have sent in prednisone for your arm pain, if not better, please let me know  Can also apply ice or heat, whichever helps  Can take 2 extra strength tylenol every 8 to 12 hours as needed  If not better, please let me know

## 2018-03-04 NOTE — Progress Notes (Signed)
Subjective:    Patient ID: Gabriel Brewer, male    DOB: [birth year], 78 y.o.   MRN: [id number]  HPI This is a 78 yo male who presents today with left arm pain, radiating to his hand x 2 weeks, got better then got worse. Started in his bicep and tricep and has moved to just above elbow and has some intermittent numbness in his hand, no tingling. Feels weak. Does not recall any injury or over use. Has not taken any medication or applied ice or heat. Pain is mostly dull with occasional poking pain.   Has been doing ok, getting transfusions and following up with hematology. Energy level unchanged. No SOB. No chest pain. Has been seeing dermatology for skin cancer on face and has recently had a course of chemotherapy topical cream.   He is con Xarelto.   Past Medical History:  Diagnosis Date  . Cancer ([place]) 2017   skin   . Diabetes mellitus without complication ([place])   . GERD (gastroesophageal reflux disease)   . Hyperlipidemia   . Hypertension    Past Surgical History:  Procedure Laterality Date  . COLONOSCOPY WITH PROPOFOL N/A 11/17/2017   Procedure: COLONOSCOPY WITH PROPOFOL;  Surgeon: Jonathon Bellows, MD;  Location: [facility] ENDOSCOPY;  Service: Gastroenterology;  Laterality: N/A;  . ESOPHAGOGASTRODUODENOSCOPY (EGD) WITH PROPOFOL N/A 11/17/2017   Procedure: ESOPHAGOGASTRODUODENOSCOPY (EGD) WITH PROPOFOL;  Surgeon: Jonathon Bellows, MD;  Location: [facility] ENDOSCOPY;  Service: Gastroenterology;  Laterality: N/A;  . GIVENS CAPSULE STUDY N/A 12/09/2017   Procedure: GIVENS CAPSULE STUDY;  Surgeon: Jonathon Bellows, MD;  Location[facility] ENDOSCOPY;  Service: Gastroenterology;  Laterality: N/A;  . TONSILLECTOMY     Family History  Problem Relation Age of Onset  . Cerebral aneurysm Mother   . Heart disease Father   . COPD Father   . Breast cancer Sister   . Cancer Brother        unknowsn   Social History   Tobacco Use  . Smoking status: Former Smoker    Packs/day: 0.50    Years: 20.00    Pack years: 10.00      Types: Cigarettes    Last attempt to quit: 11/20/1982    Years since quitting: 35.3  . Smokeless tobacco: Never Used  Substance Use Topics  . Alcohol use: No  . Drug use: No      Review of Systems Per HPI    Objective:   Physical Exam Vitals signs reviewed.  Constitutional:      General: He is not in acute distress.    Appearance: Normal appearance. He is not ill-appearing, toxic-appearing or diaphoretic.  HENT:     Head: Normocephalic and atraumatic.  Eyes:     Conjunctiva/sclera: Conjunctivae normal.  Cardiovascular:     Rate and Rhythm: Normal rate and regular rhythm.     Heart sounds: Normal heart sounds.  Pulmonary:     Effort: Pulmonary effort is normal.     Breath sounds: Normal breath sounds.  Musculoskeletal:     Left shoulder: Normal.     Left elbow: He exhibits normal range of motion and no swelling.     Comments: Left UE with full ROM, upper and lower arm with 5/5 strength, left hand with 4/5 strength, bicep and tricep tender to palpation. No tenderness of shoulder or elbow joint. Arm warm, normal color, strong radial pulse.   Skin:    General: Skin is warm and dry.     Comments: Face with multiple areas scabbing,  erythema, no drainage or swelling.   Neurological:     Mental Status: He is alert and oriented to person, place, and time.  Psychiatric:        Mood and Affect: Mood normal.        Behavior: Behavior normal.        Thought Content: Thought content normal.        Judgment: Judgment normal.       BP 140/84 (BP Location: Right Arm, Patient Position: Sitting, Cuff Size: Normal)   Pulse 94   Temp 97.6 F (36.4 C) (Oral)   Ht 5' 11.5" (1.816 m)   Wt 174 lb 4 oz (79 kg)   SpO2 97%   BMI 23.96 kg/m  Wt Readings from Last 3 Encounters:  03/04/18 174 lb 4 oz (79 kg)  01/23/18 174 lb 4.8 oz (79.1 kg)  12/03/17 172 lb 12.8 oz (78.4 kg)       Assessment & Plan:  1. Left arm pain - unable to treat with NSAIDs, will do prednisone taper,  discussed potential side effects, he was instructed to take food and full glass of water - follow up if no improvement with medication -  Patient Instructions  Good to see you today  I have sent in prednisone for your arm pain, if not better, please let me know  Can also apply ice or heat, whichever helps  Can take 2 extra strength tylenol every 8 to 12 hours as needed  If not better, please let me know   - predniSONE (DELTASONE) 10 MG tablet; 6x1 day, 5 x1 day, 4 x 1 day, 3 x 1 day, 2 x 1 day, 1 x 1 day  Dispense: 21 tablet; Refill: 0   Clarene Reamer, FNP-BC  [facility] at [facility] [employer] [facility]  03/04/2018 10:30 AM

## 2018-03-10 ENCOUNTER — Encounter: Payer: Self-pay | Admitting: Family Medicine

## 2018-03-13 ENCOUNTER — Other Ambulatory Visit: Payer: Self-pay

## 2018-03-13 ENCOUNTER — Emergency Department: Payer: Medicare Other

## 2018-03-13 ENCOUNTER — Emergency Department
Admission: EM | Admit: 2018-03-13 | Discharge: 2018-03-13 | Disposition: A | Discharge status: Home / Self Care | Payer: Medicare Other | Attending: Emergency Medicine | Admitting: Emergency Medicine

## 2018-03-13 DIAGNOSIS — R1010 Upper abdominal pain, unspecified: Secondary | ICD-10-CM | POA: Diagnosis not present

## 2018-03-13 DIAGNOSIS — Z85828 Personal history of other malignant neoplasm of skin: Secondary | ICD-10-CM | POA: Insufficient documentation

## 2018-03-13 DIAGNOSIS — K859 Acute pancreatitis without necrosis or infection, unspecified: Secondary | ICD-10-CM

## 2018-03-13 DIAGNOSIS — R079 Chest pain, unspecified: Secondary | ICD-10-CM | POA: Diagnosis not present

## 2018-03-13 DIAGNOSIS — I10 Essential (primary) hypertension: Secondary | ICD-10-CM | POA: Diagnosis not present

## 2018-03-13 DIAGNOSIS — Z87891 Personal history of nicotine dependence: Secondary | ICD-10-CM | POA: Diagnosis not present

## 2018-03-13 DIAGNOSIS — R0789 Other chest pain: Secondary | ICD-10-CM | POA: Diagnosis not present

## 2018-03-13 DIAGNOSIS — E119 Type 2 diabetes mellitus without complications: Secondary | ICD-10-CM | POA: Diagnosis not present

## 2018-03-13 DIAGNOSIS — Z7901 Long term (current) use of anticoagulants: Secondary | ICD-10-CM | POA: Insufficient documentation

## 2018-03-13 DIAGNOSIS — R001 Bradycardia, unspecified: Secondary | ICD-10-CM | POA: Diagnosis not present

## 2018-03-13 DIAGNOSIS — Z79899 Other long term (current) drug therapy: Secondary | ICD-10-CM | POA: Insufficient documentation

## 2018-03-13 LAB — HEPATIC FUNCTION PANEL
ALT: 17 U/L (ref 0–44)
AST: 20 U/L (ref 15–41)
Albumin: 4.1 g/dL (ref 3.5–5.0)
Alkaline Phosphatase: 52 U/L (ref 38–126)
Bilirubin, Direct: 0.1 mg/dL (ref 0.0–0.2)
Indirect Bilirubin: 0.6 mg/dL (ref 0.3–0.9)
Total Bilirubin: 0.7 mg/dL (ref 0.3–1.2)
Total Protein: 6.8 g/dL (ref 6.5–8.1)

## 2018-03-13 LAB — CBC
HCT: 45 % (ref 39.0–52.0)
Hemoglobin: 14.3 g/dL (ref 13.0–17.0)
MCH: 24.8 pg — ABNORMAL LOW (ref 26.0–34.0)
MCHC: 31.8 g/dL (ref 30.0–36.0)
MCV: 78.1 fL — AB (ref 80.0–100.0)
Platelets: 248 10*3/uL (ref 150–400)
RBC: 5.76 MIL/uL (ref 4.22–5.81)
RDW: 21.7 % — ABNORMAL HIGH (ref 11.5–15.5)
WBC: 9.3 10*3/uL (ref 4.0–10.5)
nRBC: 0 % (ref 0.0–0.2)

## 2018-03-13 LAB — TROPONIN I
Troponin I: <0.03 ng/mL (ref <0.03)
Troponin I: <0.03 ng/mL (ref <0.03)

## 2018-03-13 LAB — BASIC METABOLIC PANEL
Anion gap: 9 (ref 5–15)
BUN: 26 mg/dL — AB (ref 8–23)
CO2: 24 mmol/L (ref 22–32)
Calcium: 8.9 mg/dL (ref 8.9–10.3)
Chloride: 103 mmol/L (ref 98–111)
Creatinine, Ser: 1.07 mg/dL (ref 0.61–1.24)
GFR calc Af Amer: >60 mL/min (ref >60)
GFR calc non Af Amer: >60 mL/min (ref >60)
GLUCOSE: 143 mg/dL — AB (ref 70–99)
Potassium: 3.9 mmol/L (ref 3.5–5.1)
Sodium: 136 mmol/L (ref 135–145)

## 2018-03-13 LAB — LIPASE, BLOOD: Lipase: 53 U/L — ABNORMAL HIGH (ref 11–51)

## 2018-03-13 MED ORDER — LIDOCAINE VISCOUS HCL 2 % MT SOLN
15.0000 mL | Freq: Once | OROMUCOSAL | Status: AC
  Administered 2018-03-13: 15 mL via ORAL
  Filled 2018-03-13: qty 15

## 2018-03-13 MED ORDER — ALUM & MAG HYDROXIDE-SIMETH 200-200-20 MG/5ML PO SUSP
30.0000 mL | Freq: Once | ORAL | Status: AC
  Administered 2018-03-13: 30 mL via ORAL
  Filled 2018-03-13: qty 30

## 2018-03-13 MED ORDER — HYDROCODONE-ACETAMINOPHEN 5-325 MG PO TABS: 1.0000 | ORAL_TABLET | ORAL | 0 refills | Status: DC | PRN

## 2018-03-13 MED ORDER — HYDROCODONE-ACETAMINOPHEN 5-325 MG PO TABS
1.0000 | ORAL_TABLET | Freq: Once | ORAL | Status: AC
  Administered 2018-03-13: 1 via ORAL
  Filled 2018-03-13: qty 1

## 2018-03-13 NOTE — Discharge Instructions (Addendum)
Take your pain medication as needed, as written.  Please drink plenty of fluids and adhere to a clear liquid diet for the next 48 hours.  You may then progressed to a very low-fat diet x7 days.  If your abdominal pain worsens or you develop a fever or develop any further chest pain or trouble breathing please return to the emergency department for evaluation.  Otherwise please follow-up with your doctor in 1 to 2 days for recheck/reevaluation.

## 2018-03-13 NOTE — ED Notes (Addendum)
Patient reports chest pain better after GI Cocktail. Trop # 2 drawn and sent.

## 2018-03-13 NOTE — ED Triage Notes (Addendum)
Patient reports having "crushing" chest pain for approximately 2 hours.  Denies vomiting.  +nausea.  Pain mid sternal, no radiating.

## 2018-03-13 NOTE — ED Notes (Signed)
Pt placed in room and on monitor .

## 2018-03-13 NOTE — ED Notes (Signed)
Report from jeanette, rn.  

## 2018-03-13 NOTE — ED Provider Notes (Signed)
[facility] Emergency Department Provider Note  Time seen: 10:20 PM  I have reviewed the triage vital signs and the nursing notes.   HISTORY  Chief Complaint Chest Pain    HPI Gabriel Brewer is a 78 y.o. male a past medical history of diabetes, gastric reflux, hypertension, hyperlipidemia presents to the emergency department for upper abdominal/lower chest discomfort.  According to the patient around 5 or 6 PM tonight he developed upper abdominal/lower chest discomfort.  Patient states it feels like gas but it is not going away.  Denies any history of similar pain in the past.  Denies any shortness of breath nausea or diaphoresis.  Denies any alcohol use.  No history of cardiac disease per patient.   Past Medical History:  Diagnosis Date  . Cancer ([place]) 2017   skin   . Diabetes mellitus without complication ([place])   . GERD (gastroesophageal reflux disease)   . Hyperlipidemia   . Hypertension     Patient Active Problem List   Diagnosis Date Noted  . Iron deficiency anemia 11/14/2017  . Elevated prostate specific antigen (PSA) 11/12/2017  . Esophageal reflux 11/12/2017  . Family history of malignant neoplasm of prostate 11/12/2017  . Low back pain 11/12/2017  . Vitamin D deficiency 11/12/2017  . Pneumonia 11/03/2016  . Endocarditis of mitral valve 09/27/2015  . Mitral regurgitation 09/27/2015  . Essential hypertension 08/18/2015  . Skin cancer of face 02/06/2015  . Mixed hyperlipidemia 10/22/2014    Past Surgical History:  Procedure Laterality Date  . COLONOSCOPY WITH PROPOFOL N/A 11/17/2017   Procedure: COLONOSCOPY WITH PROPOFOL;  Surgeon: Jonathon Bellows, MD;  Location: [facility] ENDOSCOPY;  Service: Gastroenterology;  Laterality: N/A;  . ESOPHAGOGASTRODUODENOSCOPY (EGD) WITH PROPOFOL N/A 11/17/2017   Procedure: ESOPHAGOGASTRODUODENOSCOPY (EGD) WITH PROPOFOL;  Surgeon: Jonathon Bellows, MD;  Location: [facility] ENDOSCOPY;  Service: Gastroenterology;  Laterality: N/A;  .  GIVENS CAPSULE STUDY N/A 12/09/2017   Procedure: GIVENS CAPSULE STUDY;  Surgeon: Jonathon Bellows, MD;  Location: [facility] ENDOSCOPY;  Service: Gastroenterology;  Laterality: N/A;  . TONSILLECTOMY      Prior to Admission medications   Medication Sig Start Date End Date Taking? Authorizing Provider  atorvastatin (LIPITOR) 10 MG tablet Take 1 tablet (10 mg total) by mouth daily. 03/04/18   Elby Beck, FNP  benazepril (LOTENSIN) 40 MG tablet Take 1 tablet (40 mg total) by mouth every evening. 11/07/16   Gladstone Lighter, MD  Cholecalciferol 4000 units CAPS Take 1 capsule by mouth every 7 (seven) days.    [provider]  diltiazem (CARDIZEM CD) 180 MG 24 hr capsule Take 1 capsule (180 mg total) by mouth daily. 11/07/16   Gladstone Lighter, MD  fluticasone (FLONASE) 50 MCG/ACT nasal spray as needed.  08/15/17   [provider]  glucose blood (ONE TOUCH ULTRA TEST) test strip TEST ONCE DAILY 12/09/16   [provider]  metFORMIN (GLUCOPHAGE) 500 MG tablet Take 1 tablet (500 mg total) by mouth 2 (two) times daily. 11/12/17   Elby Beck, FNP  omeprazole (PRILOSEC) 40 MG capsule Take 1 capsule by mouth daily. 09/21/14   [provider]  predniSONE (DELTASONE) 10 MG tablet 6x1 day, 5 x1 day, 4 x 1 day, 3 x 1 day, 2 x 1 day, 1 x 1 day 03/04/18   Elby Beck, FNP  rivaroxaban (XARELTO) 20 MG TABS tablet Take 1 tablet (20 mg total) by mouth daily with supper. 11/07/16   Gladstone Lighter, MD    No Known  Allergies  Family History  Problem Relation Age of Onset  . Cerebral aneurysm Mother   . Heart disease Father   . COPD Father   . Breast cancer Sister   . Cancer Brother        unknowsn    Social History Social History   Tobacco Use  . Smoking status: Former Smoker    Packs/day: 0.50    Years: 20.00    Pack years: 10.00    Types: Cigarettes    Last attempt to quit: 11/20/1982    Years since quitting: 35.3  . Smokeless tobacco: Never Used   Substance Use Topics  . Alcohol use: No  . Drug use: No    Review of Systems Constitutional: Negative for fever. Cardiovascular: Mild lower chest discomfort. Respiratory: Negative for shortness of breath. Gastrointestinal: Upper abdominal discomfort.  Negative for nausea vomiting or diarrhea. Genitourinary: Negative for urinary compaints Musculoskeletal: Negative for musculoskeletal complaints Neurological: Negative for headache All other ROS negative  ____________________________________________   PHYSICAL EXAM:  VITAL SIGNS: ED Triage Vitals  Enc Vitals Group     BP 03/13/18 1953 (!) 183/92     Pulse Rate 03/13/18 1953 76     Resp 03/13/18 1953 18     Temp 03/13/18 1953 97.7 F (36.5 C)     Temp Source 03/13/18 1953 Oral     SpO2 03/13/18 1953 98 %     Weight --      Height --      Head Circumference --      Peak Flow --      Pain Score 03/13/18 1951 8     Pain Loc --      Pain Edu? --      Excl. in [place]? --    Constitutional: Alert and oriented. Well appearing and in no distress. Eyes: Normal exam ENT   Head: Normocephalic and atraumatic.   Mouth/Throat: Mucous membranes are moist. Cardiovascular: Normal rate, regular rhythm.  Respiratory: Normal respiratory effort without tachypnea nor retractions. Breath sounds are clear Gastrointestinal: Soft, moderate epigastric tenderness palpation otherwise benign abdominal exam without rebound guarding or distention. Musculoskeletal: Nontender with normal range of motion in all extremities.  Neurologic:  Normal speech and language. No gross focal neurologic deficits Skin:  Skin is warm, dry and intact.  Psychiatric: Mood and affect are normal.  ____________________________________________    EKG  EKG viewed and interpreted by myself shows sinus bradycardia at 59 bpm with a narrow QRS, right axis deviation, largely normal intervals with no concerning ST changes.  ____________________________________________     RADIOLOGY  Chest x-ray is negative  ____________________________________________   INITIAL IMPRESSION / ASSESSMENT AND PLAN / ED COURSE  Pertinent labs & imaging results that were available during my care of the patient were reviewed by me and considered in my medical decision making (see chart for details).  Patient presents to the emergency department for upper abdominal/lower chest discomfort starting around 5 or 6 PM tonight.  Patient describes the pain as a 5 or 6 aching type pain in the upper abdomen.  States it feels like gas almost a burning discomfort.  Differential at this time would include ACS, gastritis, peptic ulcer disease, pancreatitis, gallbladder disease.  We will check labs including LFTs and lipase.  We will add on a troponin.  We will continue to closely monitor.  Patient is EKG is reassuring.  His initial lab work is reassuring besides a mild elevation in his lipase which very likely could  be the cause of the patient's discomfort.  He states after GI cocktail his pain is currently a 2/10.  Is asking to be discharged home.  Patient is agreeable to stay for an additional troponin to help rule out cardiac pathology.  Reassuringly LFTs are normal.  Patient's repeat troponin is negative.  Continues to wish to go home.  I highly suspect mild pancreatitis with a cause of the patient's discomfort.  We will discharge with a short course of pain medication, I have discussed  ____________________________________________   FINAL CLINICAL IMPRESSION(S) / ED DIAGNOSES  Mild acute pancreatitis    Harvest Dark, MD 03/13/18 2328

## 2018-03-16 ENCOUNTER — Telehealth: Payer: Self-pay

## 2018-03-16 ENCOUNTER — Ambulatory Visit
Admission: RE | Admit: 2018-03-16 | Discharge: 2018-03-16 | Disposition: A | Discharge status: Home / Self Care | Payer: Medicare Other | Source: Ambulatory Visit | Attending: Family Medicine | Admitting: Family Medicine

## 2018-03-16 ENCOUNTER — Ambulatory Visit (INDEPENDENT_AMBULATORY_CARE_PROVIDER_SITE_OTHER): Payer: Medicare Other | Admitting: Family Medicine

## 2018-03-16 ENCOUNTER — Encounter: Payer: Self-pay | Admitting: Family Medicine

## 2018-03-16 VITALS — BP 108/64 | HR 87 | Temp 98.0°F | Resp 16 | Ht 71.0 in | Wt 169.8 lb

## 2018-03-16 DIAGNOSIS — K8020 Calculus of gallbladder without cholecystitis without obstruction: Secondary | ICD-10-CM | POA: Diagnosis not present

## 2018-03-16 DIAGNOSIS — K8012 Calculus of gallbladder with acute and chronic cholecystitis without obstruction: Secondary | ICD-10-CM | POA: Diagnosis not present

## 2018-03-16 DIAGNOSIS — R1011 Right upper quadrant pain: Secondary | ICD-10-CM | POA: Insufficient documentation

## 2018-03-16 LAB — CBC WITH DIFFERENTIAL/PLATELET
BASOS ABS: 0 10*3/uL (ref 0.0–0.1)
Basophils Relative: 0.4 % (ref 0.0–3.0)
Eosinophils Absolute: 0.1 10*3/uL (ref 0.0–0.7)
Eosinophils Relative: 0.7 % (ref 0.0–5.0)
HCT: 47.3 % (ref 39.0–52.0)
Hemoglobin: 15.4 g/dL (ref 13.0–17.0)
Lymphocytes Relative: 12.8 % (ref 12.0–46.0)
Lymphs Abs: 1.8 10*3/uL (ref 0.7–4.0)
MCHC: 32.6 g/dL (ref 30.0–36.0)
MCV: 78.3 fl (ref 78.0–100.0)
Monocytes Absolute: 1.4 10*3/uL — ABNORMAL HIGH (ref 0.1–1.0)
Monocytes Relative: 9.8 % (ref 3.0–12.0)
Neutro Abs: 10.5 10*3/uL — ABNORMAL HIGH (ref 1.4–7.7)
Neutrophils Relative %: 76.3 % (ref 43.0–77.0)
Platelets: 232 10*3/uL (ref 150.0–400.0)
RBC: 6.04 Mil/uL — ABNORMAL HIGH (ref 4.22–5.81)
RDW: 23.8 % — ABNORMAL HIGH (ref 11.5–15.5)
WBC: 13.8 10*3/uL — ABNORMAL HIGH (ref 4.0–10.5)

## 2018-03-16 LAB — HEPATIC FUNCTION PANEL
ALT: 14 U/L (ref 0–53)
AST: 16 U/L (ref 0–37)
Albumin: 4.2 g/dL (ref 3.5–5.2)
Alkaline Phosphatase: 68 U/L (ref 39–117)
Bilirubin, Direct: 0.2 mg/dL (ref 0.0–0.3)
Total Bilirubin: 1.1 mg/dL (ref 0.2–1.2)
Total Protein: 7.4 g/dL (ref 6.0–8.3)

## 2018-03-16 LAB — LIPASE: LIPASE: 17 U/L (ref 11.0–59.0)

## 2018-03-16 NOTE — Addendum Note (Signed)
Addended by: Clarene Reamer B on: 03/16/2018 06:02 PM   Modules accepted: Orders

## 2018-03-16 NOTE — Telephone Encounter (Signed)
Noted.  See result note.  

## 2018-03-16 NOTE — Patient Instructions (Signed)
Good to see you today  Stop at the desk to see a referral coordinator to schedule an abdominal ultrasound  Eat bland foods and drink enough fluids to make your urine light yellow  I will notify you of results  If your symptoms worsen, please let me know

## 2018-03-16 NOTE — Progress Notes (Signed)
Subjective:    Patient ID: Gabriel Brewer, male    DOB: [birth year], 78 y.o.   MRN: [id number]  HPI This is a 78 yo male who presents today for follow up of ER visit on 02/15/18. He was seen for upper abdominal pain/lower chest discomfort. He had negative troponin, EKG, CXR with no acute findings. Lipase was very minimally elevated and he was diagnosed with pancreatitis. He was given a GI cocktail with improvement of pain and discharged. Today he reports RUQ pain that radiates to side (not to back) with deep breaths and movement. The pain is sharp. He does not have pain with walking. Has not been eating anything since ER discharge (according to patient, he was told not to eat). He has been taking pain medication with some relief of pain (morco 5-325). He has not had a BM in 3 days. Does not feel bloated or constipated. Denies nausea/vomiting, back pain, chest pain. Prior to pain starting, he put some items in his attic. Though not heavy, this is unusual activity for him.   Past Medical History:  Diagnosis Date  . Cancer ([place]) 2017   skin   . Diabetes mellitus without complication ([place])   . GERD (gastroesophageal reflux disease)   . Hyperlipidemia   . Hypertension    Past Surgical History:  Procedure Laterality Date  . COLONOSCOPY WITH PROPOFOL N/A 11/17/2017   Procedure: COLONOSCOPY WITH PROPOFOL;  Surgeon: Jonathon Bellows, MD;  Location: [facility] ENDOSCOPY;  Service: Gastroenterology;  Laterality: N/A;  . ESOPHAGOGASTRODUODENOSCOPY (EGD) WITH PROPOFOL N/A 11/17/2017   Procedure: ESOPHAGOGASTRODUODENOSCOPY (EGD) WITH PROPOFOL;  Surgeon: Jonathon Bellows, MD;  Location: [facility] ENDOSCOPY;  Service: Gastroenterology;  Laterality: N/A;  . GIVENS CAPSULE STUDY N/A 12/09/2017   Procedure: GIVENS CAPSULE STUDY;  Surgeon: Jonathon Bellows, MD;  Location: [facility] ENDOSCOPY;  Service: Gastroenterology;  Laterality: N/A;  . TONSILLECTOMY     Family History  Problem Relation Age of Onset  . Cerebral aneurysm Mother   . Heart  disease Father   . COPD Father   . Breast cancer Sister   . Cancer Brother        unknowsn   Social History   Tobacco Use  . Smoking status: Former Smoker    Packs/day: 0.50    Years: 20.00    Pack years: 10.00    Types: Cigarettes    Last attempt to quit: 11/20/1982    Years since quitting: 35.3  . Smokeless tobacco: Never Used  Substance Use Topics  . Alcohol use: No  . Drug use: No      Review of Systems Per HPI    Objective:   Physical Exam Vitals signs reviewed.  Constitutional:      General: He is not in acute distress.    Appearance: Normal appearance. He is normal weight. He is not ill-appearing, toxic-appearing or diaphoretic.  HENT:     Head: Normocephalic and atraumatic.  Cardiovascular:     Rate and Rhythm: Normal rate.     Heart sounds: Normal heart sounds.  Pulmonary:     Effort: Pulmonary effort is normal.     Breath sounds: Normal breath sounds.  Abdominal:     General: Abdomen is flat. Bowel sounds are normal.     Palpations: Abdomen is soft. There is no mass.     Tenderness: There is abdominal tenderness in the right upper quadrant. There is no guarding or rebound.     Hernia: No hernia is present.  Skin:    General:  Skin is warm and dry.  Neurological:     Mental Status: He is alert and oriented to person, place, and time.  Psychiatric:        Mood and Affect: Mood normal.        Behavior: Behavior normal.        Thought Content: Thought content normal.        Judgment: Judgment normal.       BP 108/64   Pulse 87   Temp 98 F (36.7 C) (Oral)   Resp 16   Ht 5\' 11"  (1.803 m)   Wt 169 lb 12.8 oz (77 kg)   SpO2 92%   BMI 23.68 kg/m  Wt Readings from Last 3 Encounters:  03/16/18 169 lb 12.8 oz (77 kg)  03/04/18 174 lb 4 oz (79 kg)  01/23/18 174 lb 4.8 oz (79.1 kg)       Assessment & Plan:  Discussed with Dr. Damita Dunnings 1. Right upper quadrant pain - unclear etiology, will recheck labs to see if increased lipase, LFTs or WBC -  bland diet - Lipase - CBC with Differential - US Abdomen Complete; Future - Hepatic Function Panel   Clarene Reamer, FNP-BC  [facility], [facility]  03/16/2018 11:08 AM

## 2018-03-16 NOTE — Telephone Encounter (Signed)
Ultrasound Call Report from [facility]:    1. Abnormal gallbladder with wall thickening and small stones. But no pericholecystic fluid or sonographic Murphy sign to strongly suggest Acute Cholecystitis. Consider Chronic or mild Acute onChronic Cholecystitis. 2. No evidence of bile duct obstruction. 3. Other visible abdominal viscera are normal.  Processing report in Epic.  Radiologist H. Hall reporting.   Patient told that he could leave and wait for a call from PCP office.

## 2018-03-16 NOTE — Progress Notes (Signed)
Subjective:    Patient ID: Gabriel Brewer, male    DOB: 1940/03/26, 78 y.o.   MRN: [id number]  HPI  This is a 78 yo male being seen in the office today for follow- up from the ED for chest pain. Pain started in center of chest, rated pain 8-9/10, turned clammy, sweaty feeling, no LOC. ED checked chext x-ray, EKG, troponin levels: all negative. Liapase slightly elevated at 53, diagnosed with pancreatitis. He was told not to eat and follow up with primary. He has has no food for 3 days, no bowel movements, no dysuria.   He states he cannot take deep breaths, pain starts in RLQ and traverses laterally.Cannot sleep on side, trouble turning in bed. Past Medical History:  Diagnosis Date  . Cancer ([place]) 2017   skin   . Diabetes mellitus without complication ([place])   . GERD (gastroesophageal reflux disease)   . Hyperlipidemia   . Hypertension    Past Surgical History:  Procedure Laterality Date  . COLONOSCOPY WITH PROPOFOL N/A 11/17/2017   Procedure: COLONOSCOPY WITH PROPOFOL;  Surgeon: Jonathon Bellows, MD;  Location: [facility] ENDOSCOPY;  Service: Gastroenterology;  Laterality: N/A;  . ESOPHAGOGASTRODUODENOSCOPY (EGD) WITH PROPOFOL N/A 11/17/2017   Procedure: ESOPHAGOGASTRODUODENOSCOPY (EGD) WITH PROPOFOL;  Surgeon: Jonathon Bellows, MD;  Location: [facility] ENDOSCOPY;  Service: Gastroenterology;  Laterality: N/A;  . GIVENS CAPSULE STUDY N/A 12/09/2017   Procedure: GIVENS CAPSULE STUDY;  Surgeon: Jonathon Bellows, MD;  Location: [facility] ENDOSCOPY;  Service: Gastroenterology;  Laterality: N/A;  . TONSILLECTOMY     Family History  Problem Relation Age of Onset  . Cerebral aneurysm Mother   . Heart disease Father   . COPD Father   . Breast cancer Sister   . Cancer Brother        unknowsn   Social History   Tobacco Use  . Smoking status: Former Smoker    Packs/day: 0.50    Years: 20.00    Pack years: 10.00    Types: Cigarettes    Last attempt to quit: 11/20/1982    Years since quitting: 35.3  . Smokeless tobacco:  Never Used  Substance Use Topics  . Alcohol use: No  . Drug use: No      Review of Systems Per HPI    Objective:   Physical Exam Constitutional:      Appearance: Normal appearance.  Cardiovascular:     Rate and Rhythm: Normal rate and regular rhythm.  Abdominal:     General: Abdomen is flat. Bowel sounds are normal.     Palpations: Abdomen is soft.     Tenderness: There is abdominal tenderness in the right upper quadrant.     Comments: RUQ tenderness during moderate palpation  Skin:    General: Skin is warm and dry.  Neurological:     Mental Status: He is alert.    BP 108/64   Pulse 87   Temp 98 F (36.7 C) (Oral)   Resp 16   Ht 5\' 11"  (1.803 m)   Wt 169 lb 12.8 oz (77 kg)   SpO2 92%   BMI 23.68 kg/m    BP Readings from Last 3 Encounters:  03/16/18 108/64  03/13/18 (!) 155/89  03/04/18 140/84   Wt Readings from Last 3 Encounters:  03/16/18 169 lb 12.8 oz (77 kg)  03/04/18 174 lb 4 oz (79 kg)  01/23/18 174 lb 4.8 oz (79.1 kg)        Assessment & Plan:  Right upper quadrant pain -  Plan: Lipase, CBC with Differential, US Abdomen Complete, Hepatic Function Panel  Patient Instructions  Good to see you today  Stop at the desk to see a referral coordinator to schedule an abdominal ultrasound  Eat bland foods and drink enough fluids to make your urine light yellow  I will notify you of results  If your symptoms worsen, please let me know

## 2018-03-24 ENCOUNTER — Ambulatory Visit (INDEPENDENT_AMBULATORY_CARE_PROVIDER_SITE_OTHER): Payer: Medicare Other | Admitting: Surgery

## 2018-03-24 ENCOUNTER — Encounter: Payer: Self-pay | Admitting: Surgery

## 2018-03-24 ENCOUNTER — Other Ambulatory Visit: Payer: Self-pay

## 2018-03-24 VITALS — BP 142/74 | HR 72 | Temp 97.8°F | Ht 71.0 in | Wt 169.0 lb

## 2018-03-24 DIAGNOSIS — K812 Acute cholecystitis with chronic cholecystitis: Secondary | ICD-10-CM

## 2018-03-24 NOTE — Progress Notes (Signed)
Surgical Clinic History and Physical  Referring provider:  Elby Beck, FNP [place], [place] [address]  HISTORY OF PRESENT ILLNESS (HPI):  78 y.o. male presents for evaluation of his abdominal pain. Patient reports he first developed epigastric abdominal pain Friday, 2/28, for which he presented to [facility] ED and was found to have a mildly elevated lipase and negative cardiac workup. Patient at that time requested to go home and was discharged from the ED with advice to follow up with his primary care physician. When he was evaluated by his primary care physician 3 days later on Monday, 3/2, his epigastric abdominal pain had "moved" over the weekend to his RUQ, for which his primary care physician ordered RUQ abdominal ultrasound and repeat CBC and CMP. Patient denies any prior similar or lesser episodes and likewise denies any fever/chills, N/V, constipation, diarrhea, CP, or acutely worsened SOB. Of note, patient recalls having once been told that he should not do anything for his gallstones, "though they could one day require emergency surgery".  PAST MEDICAL HISTORY (PMH):  Past Medical History:  Diagnosis Date  . Cancer ([place]) 2017   skin   . Diabetes mellitus without complication ([place])   . GERD (gastroesophageal reflux disease)   . Hyperlipidemia   . Hypertension     PAST SURGICAL HISTORY ([place]):  Past Surgical History:  Procedure Laterality Date  . COLONOSCOPY WITH PROPOFOL N/A 11/17/2017   Procedure: COLONOSCOPY WITH PROPOFOL;  Surgeon: Jonathon Bellows, MD;  Location: [facility] ENDOSCOPY;  Service: Gastroenterology;  Laterality: N/A;  . ESOPHAGOGASTRODUODENOSCOPY (EGD) WITH PROPOFOL N/A 11/17/2017   Procedure: ESOPHAGOGASTRODUODENOSCOPY (EGD) WITH PROPOFOL;  Surgeon: Jonathon Bellows, MD;  Location: [facility] ENDOSCOPY;  Service: Gastroenterology;  Laterality: N/A;  . GIVENS CAPSULE STUDY N/A 12/09/2017   Procedure: GIVENS CAPSULE STUDY;  Surgeon: Jonathon Bellows, MD;  Location: [facility]  ENDOSCOPY;  Service: Gastroenterology;  Laterality: N/A;  . TONSILLECTOMY      MEDICATIONS:  Prior to Admission medications   Medication Sig Start Date End Date Taking? Authorizing Provider  atorvastatin (LIPITOR) 10 MG tablet Take 1 tablet (10 mg total) by mouth daily. 03/04/18  Yes Elby Beck, FNP  benazepril (LOTENSIN) 40 MG tablet Take 1 tablet (40 mg total) by mouth every evening. 11/07/16  Yes Gladstone Lighter, MD  Cholecalciferol 4000 units CAPS Take 1 capsule by mouth every 7 (seven) days.   Yes [provider]  diltiazem (CARDIZEM CD) 180 MG 24 hr capsule Take 1 capsule (180 mg total) by mouth daily. 11/07/16  Yes Gladstone Lighter, MD  fluticasone (FLONASE) 50 MCG/ACT nasal spray as needed.  08/15/17  Yes [provider]  glucose blood (ONE TOUCH ULTRA TEST) test strip TEST ONCE DAILY 12/09/16  Yes [provider]  HYDROcodone-acetaminophen (NORCO/VICODIN) 5-325 MG tablet Take 1 tablet by mouth every 4 (four) hours as needed. 03/13/18  Yes Harvest Dark, MD  metFORMIN (GLUCOPHAGE) 500 MG tablet Take 1 tablet (500 mg total) by mouth 2 (two) times daily. 11/12/17  Yes Elby Beck, FNP  omeprazole (PRILOSEC) 40 MG capsule Take 1 capsule by mouth daily. 09/21/14  Yes [provider]  predniSONE (DELTASONE) 10 MG tablet 6x1 day, 5 x1 day, 4 x 1 day, 3 x 1 day, 2 x 1 day, 1 x 1 day 03/04/18  Yes Elby Beck, FNP  rivaroxaban (XARELTO) 20 MG TABS tablet Take 1 tablet (20 mg total) by mouth daily with supper. 11/07/16  Yes Gladstone Lighter, MD  ALLERGIES:  No Known Allergies   SOCIAL HISTORY:  Social History   Socioeconomic History  . Marital status: Married    Spouse name: Not on file  . Number of children: Not on file  . Years of education: Not on file  . Highest education level: Not on file  Occupational History  . Occupation: retired  Scientific laboratory technician  . Financial resource strain: Not on file  . Food insecurity:     Worry: Not on file    Inability: Not on file  . Transportation needs:    Medical: Not on file    Non-medical: Not on file  Tobacco Use  . Smoking status: Former Smoker    Packs/day: 0.50    Years: 20.00    Pack years: 10.00    Types: Cigarettes    Last attempt to quit: 11/20/1982    Years since quitting: 35.3  . Smokeless tobacco: Never Used  Substance and Sexual Activity  . Alcohol use: No  . Drug use: No  . Sexual activity: Not Currently    Partners: Female  Lifestyle  . Physical activity:    Days per week: Not on file    Minutes per session: Not on file  . Stress: Not on file  Relationships  . Social connections:    Talks on phone: Not on file    Gets together: Not on file    Attends religious service: Not on file    Active member of club or organization: Not on file    Attends meetings of clubs or organizations: Not on file    Relationship status: Not on file  . Intimate partner violence:    Fear of current or ex partner: Not on file    Emotionally abused: Not on file    Physically abused: Not on file    Forced sexual activity: Not on file  Other Topics Concern  . Not on file  Social History Narrative  . Not on file    The patient currently resides (home / rehab facility / nursing home): Home The patient normally is (ambulatory / bedbound): Ambulatory  FAMILY HISTORY:  Family History  Problem Relation Age of Onset  . Cerebral aneurysm Mother   . Heart disease Father   . COPD Father   . Breast cancer Sister   . Cancer Brother        unknowsn    Otherwise negative/non-contributory.  REVIEW OF SYSTEMS:  Constitutional: denies any other weight loss, fever, chills, or sweats  Eyes: denies any other vision changes, history of eye injury  ENT: denies sore throat, hearing problems  Respiratory: denies shortness of breath, wheezing  Cardiovascular: denies chest pain, palpitations  Gastrointestinal: abdominal pain, N/V, and bowel function as per  HPI Musculoskeletal: denies any other joint pains or cramps  Skin: Denies any other rashes or skin discolorations Neurological: denies any other headache, dizziness, weakness  Psychiatric: Denies any other depression, anxiety   All other review of systems were otherwise negative   VITAL SIGNS:  BP (!) 142/74   Pulse 72   Temp 97.8 F (36.6 C) (Skin)   Ht 5\' 11"  (1.803 m)   Wt 169 lb (76.7 kg)   SpO2 98%   BMI 23.57 kg/m   PHYSICAL EXAM:  Constitutional:  -- Normal body habitus  -- Awake, alert, and oriented x3  Eyes:  -- Pupils equally round and reactive to light  -- No scleral icterus  Ear, nose, throat:  -- No jugular venous distension --  No nasal drainage, bleeding Pulmonary:  -- No crackles  -- Equal breath sounds bilaterally -- Breathing non-labored at rest Cardiovascular:  -- S1, S2 present  -- No pericardial rubs  Gastrointestinal:  -- Abdomen soft and non-distended with minimal RUQ abdominal tenderness to deep palpation, no guarding/rebound tenderness -- No abdominal masses appreciated, pulsatile or otherwise  Musculoskeletal and Integumentary:  -- Wounds or skin discoloration: None appreciated -- Extremities: B/L UE and LE FROM, hands and feet warm, no edema  Neurologic:  -- Motor function: Intact and symmetric -- Sensation: Intact and symmetric  Labs: CBC Latest Ref Rng & Units 03/16/2018 03/13/2018 01/22/2018  WBC 4.0 - 10.5 K/uL 13.8(H) 9.3 6.6  Hemoglobin 13.0 - 17.0 g/dL 15.4 14.3 12.0(L)  Hematocrit 39.0 - 52.0 % 47.3 45.0 38.8(L)  Platelets 150.0 - 400.0 K/uL 232.0 248 254   CMP Latest Ref Rng & Units 03/16/2018 03/13/2018 11/29/2017  Glucose 70 - 99 mg/dL - 143(H) 111(H)  BUN 8 - 23 mg/dL - 26(H) 18  Creatinine 0.61 - 1.24 mg/dL - 1.07 0.94  Sodium 135 - 145 mmol/L - 136 138  Potassium 3.5 - 5.1 mmol/L - 3.9 3.7  Chloride 98 - 111 mmol/L - 103 104  CO2 22 - 32 mmol/L - 24 23  Calcium 8.9 - 10.3 mg/dL - 8.9 9.5  Total Protein 6.0 - 8.3 g/dL 7.4  6.8 -  Total Bilirubin 0.2 - 1.2 mg/dL 1.1 0.7 -  Alkaline Phos 39 - 117 U/L 68 52 -  AST 0 - 37 U/L 16 20 -  ALT 0 - 53 U/L 14 17 -   Lipase (03/13/2018): 54  Imaging studies:  Limited RUQ Abdominal Ultrasound (03/16/2018) Gallbladder: Positive for cholelithiasis. Small dependent stones individually estimated at 6 millimeters. Possible stones elsewhere it here into the gallbladder wall (fundus image 22). Abnormal gallbladder wall thickening of 7-8 millimeters. However, no sonographic Murphy sign elicited. No pericholecystic fluid.  Common bile duct: Diameter: 3-4 millimeters, normal.  Assessment/Plan:  78 y.o. male with chronic calculous cholecystitis vs mild acute on chronic cholecystitis, complicated by leukocytosis attributable to cholecystitis and by co-morbidities including relatively advanced chronological age, DM, HTN, HLD, GERD, and former chronic tobacco abuse (smoking).   - cholecystectomy advised             - avoid/minimize foods with higher fat content (meats, cheeses/dairy, and fried)             - prefer low-fat vegetables, whole grains (wheat bread, ceareals, etc), and fruits until cholecystectomy              - all risks, benefits, and alternatives to cholecystectomy were discussed with the patient, all of his questions were answered to patient's expressed satisfaction, patient expresses he would rather not have surgery, though agreed to discuss it further with his family             - will request pre-surgical medical +/- cardiac risk stratification and optimization as discussed with and agreed upon by patient to clarify patient's risk for general anesthesia             - if patient agrees to proceed, anticipate return to clinic 2 weeks after above planned surgery  - patient says he plans to call our office by end of week with his decision and office will call him if haven't yet heard from patient by end of week  - patient's ultrasound and lab results were provided to him  as per his request to  discuss with his family             - instructed to call if any questions or concerns  All of the above recommendations were discussed with the patient, and all of patient's questions were answered to his expressed satisfaction.  Thank you for the opportunity to participate in this patient's care.  -- Marilynne Drivers Rosana Hoes, MD, [place]: [facility] - Partnering for exceptional care. Office: [phone]

## 2018-03-24 NOTE — Patient Instructions (Addendum)
We spoke about having your gallbladder removed. You have chosen to not have this done at this time. We ask that you speak with your medical doctor about this.  If you choose to have this done please contact our office.   We will call you next week to check up on you if we haven't heard back from you.   Cholecystitis  Cholecystitis is inflammation of the gallbladder. It is often called a gallbladder attack. The gallbladder is a pear-shaped organ that lies beneath the liver on the right side of the body. The gallbladder stores bile, which is a fluid that helps the body digest fats. If bile builds up in your gallbladder, your gallbladder becomes inflamed. This condition may occur suddenly. Cholecystitis is a serious condition and requires treatment. What are the causes? The most common cause of this condition is gallstones. Gallstones can block the tube (duct) that carries bile out of your gallbladder. This causes bile to build up. Other causes include:  Damage to the gallbladder due to a decrease in blood flow.  Infections in the bile ducts.  Scars or kinks in the bile ducts.  Tumors in the liver, pancreas, or gallbladder. What increases the risk? You are more likely to develop this condition if:  You have sickle cell disease.  You take birth control pills or use estrogen.  You have alcoholic liver disease.  You have liver cirrhosis.  You have your nutrition delivered through a vein (parenteral nutrition).  You are critically ill.  You do not eat or drink for a long time. This is also called "fasting."  You are obese.  You lose weight too fast.  You are pregnant.  You have high levels of fat (triglycerides) in the blood.  You have pancreatitis. What are the signs or symptoms? Symptoms of this condition include:  Pain in the abdomen, especially in the upper right area of the abdomen.  Tenderness or bloating in the  abdomen.  Nausea.  Vomiting.  Fever.  Chills. How is this diagnosed? This condition is diagnosed with a medical history and physical exam. You may also have other tests, including:  Imaging tests, such as: ? An ultrasound of the gallbladder. ? A CT scan of the abdomen. ? A gallbladder nuclear scan (HIDA scan). This scan allows your health care provider to see the bile moving from your liver to your gallbladder and on to your small intestine. ? MRI.  Blood tests, such as: ? A complete blood count. The white blood cell count may be higher than normal. ? Liver function tests. Certain types of gallstones cause some results to be higher than normal. How is this treated? Treatment may include:  Surgery to remove your gallbladder (cholecystectomy).  Antibiotic medicine, usually through an IV.  Fasting for a certain amount of time.  Giving IV fluids.  Medicine to treat pain or vomiting. Follow these instructions at home:  If you had surgery, follow instructions from your health care provider about home care after the procedure. Medicines   Take over-the-counter and prescription medicines only as told by your health care provider.  If you were prescribed an antibiotic medicine, take it as told by your health care provider. Do not stop taking the antibiotic even if you start to feel better. General instructions  Follow instructions from your health care provider about what to eat or drink. When you are allowed to eat, avoid eating or drinking anything that triggers your symptoms.  Do not lift anything that is  heavier than 10 lb (4.5 kg), or the limit that you are told, until your health care provider says that it is safe.  Do not use any products that contain nicotine or tobacco, such as cigarettes and e-cigarettes. If you need help quitting, ask your health care provider.  Keep all follow-up visits as told by your health care provider. This is important. Contact a health  care provider if:  Your pain is not controlled with medicine.  You have a fever. Get help right away if:  Your pain moves to another part of your abdomen or to your back.  You continue to have symptoms or you develop new symptoms even with treatment. Summary  Cholecystitis is inflammation of the gallbladder.  The most common cause of this condition is gallstones. Gallstones can block the tube (duct) that carries bile out of your gallbladder.  Common symptoms are pain in the abdomen, nausea, vomiting, fever, and chills.  This condition is treated with surgery to remove the gallbladder, medicines, fasting, and IV fluids.  Follow your health care provider's instructions for eating and drinking. Avoid eating anything that triggers your symptoms. This information is not intended to replace advice given to you by your health care provider. Make sure you discuss any questions you have with your health care provider. Document Released: 12/31/2004 Document Revised: 05/09/2017 Document Reviewed: 05/09/2017 Elsevier Interactive Patient Education  [employer].   We will speak with your medical doctor about what was discussed at this visit.   If you have any questions, please call our office.   Laparoscopic Cholecystectomy Laparoscopic cholecystectomy is surgery to remove the gallbladder. The gallbladder is located in the upper right part of the abdomen, behind the liver. It is a storage sac for bile, which is produced in the liver. Bile aids in the digestion and absorption of fats. Cholecystectomy is often done for inflammation of the gallbladder (cholecystitis). This condition is usually caused by a buildup of gallstones (cholelithiasis) in the gallbladder. Gallstones can block the flow of bile, and that can result in inflammation and pain. In severe cases, emergency surgery may be required. If emergency surgery is not required, you will have time to prepare for the  procedure. Laparoscopic surgery is an alternative to open surgery. Laparoscopic surgery has a shorter recovery time. Your common bile duct may also need to be examined during the procedure. If stones are found in the common bile duct, they may be removed. LET [facility] CARE PROVIDER KNOW ABOUT:  Any allergies you have.  All medicines you are taking, including vitamins, herbs, eye drops, creams, and over-the-counter medicines.  Previous problems you or members of your family have had with the use of anesthetics.  Any blood disorders you have.  Previous surgeries you have had.    Any medical conditions you have. RISKS AND COMPLICATIONS Generally, this is a safe procedure. However, problems may occur, including:  Infection.  Bleeding.  Allergic reactions to medicines.  Damage to other structures or organs.  A stone remaining in the common bile duct.  A bile leak from the cyst duct that is clipped when your gallbladder is removed.  The need to convert to open surgery, which requires a larger incision in the abdomen. This may be necessary if your surgeon thinks that it is not safe to continue with a laparoscopic procedure. BEFORE THE PROCEDURE  Ask your health care provider about:  Changing or stopping your regular medicines. This is especially important if you are taking diabetes  medicines or blood thinners.  Taking medicines such as aspirin and ibuprofen. These medicines can thin your blood. Do not take these medicines before your procedure if your health care provider instructs you not to.  Follow instructions from your health care provider about eating or drinking restrictions.  Let your health care provider know if you develop a cold or an infection before surgery.  Plan to have someone take you home after the procedure.  Ask your health care provider how your surgical site will be marked or identified.  You may be given antibiotic medicine to help prevent  infection. PROCEDURE  To reduce your risk of infection:  Your health care team will wash or sanitize their hands.  Your skin will be washed with soap.  An IV tube may be inserted into one of your veins.  You will be given a medicine to make you fall asleep (general anesthetic).  A breathing tube will be placed in your mouth.  The surgeon will make several small cuts (incisions) in your abdomen.  A thin, lighted tube (laparoscope) that has a tiny camera on the end will be inserted through one of the small incisions. The camera on the laparoscope will send a picture to a TV screen (monitor) in the operating room. This will give the surgeon a good view inside your abdomen.  A gas will be pumped into your abdomen. This will expand your abdomen to give the surgeon more room to perform the surgery.  Other tools that are needed for the procedure will be inserted through the other incisions. The gallbladder will be removed through one of the incisions.  After your gallbladder has been removed, the incisions will be closed with stitches (sutures), staples, or skin glue.  Your incisions may be covered with a bandage (dressing). The procedure may vary among health care providers and hospitals. AFTER THE PROCEDURE  Your blood pressure, heart rate, breathing rate, and blood oxygen level will be monitored often until the medicines you were given have worn off.  You will be given medicines as needed to control your pain.   This information is not intended to replace advice given to you by your health care provider. Make sure you discuss any questions you have with your health care provider.   Document Released: 12/31/2004 Document Revised: 09/21/2014 Document Reviewed: 08/12/2012 Elsevier Interactive Patient Education 2016 [place] Diet for Gallbladder Conditions A low-fat diet can be helpful if you have pancreatitis or a gallbladder condition. With these conditions, your  pancreas and gallbladder have trouble digesting fats. A healthy eating plan with less fat will help rest your pancreas and gallbladder and reduce your symptoms. WHAT DO I NEED TO KNOW ABOUT THIS DIET?  Eat a low-fat diet.  Reduce your fat intake to less than 20-30% of your total daily calories. This is less than 50-60 g of fat per day.  Remember that you need some fat in your diet. Ask your dietician what your daily goal should be.  Choose nonfat and low-fat healthy foods. Look for the words "nonfat," "low fat," or "fat free."  As a guide, look on the label and choose foods with less than 3 g of fat per serving. Eat only one serving.  Avoid alcohol.  Do not smoke. If you need help quitting, talk with your health care provider.  Eat small frequent meals instead of three large heavy meals. WHAT FOODS CAN I EAT? Grains Include healthy grains and starches such as  potatoes, wheat bread, fiber-rich cereal, and brown rice. Choose whole grain options whenever possible. In adults, whole grains should account for 45-65% of your daily calories.  Fruits and Vegetables Eat plenty of fruits and vegetables. Fresh fruits and vegetables add fiber to your diet. Meats and Other Protein Sources Eat lean meat such as chicken and pork. Trim any fat off of meat before cooking it. Eggs, fish, and beans are other sources of protein. In adults, these foods should account for 10-35% of your daily calories. Dairy Choose low-fat milk and dairy options. Dairy includes fat and protein, as well as calcium.  Fats and Oils Limit high-fat foods such as fried foods, sweets, baked goods, sugary drinks.  Other Creamy sauces and condiments, such as mayonnaise, can add extra fat. Think about whether or not you need to use them, or use smaller amounts or low fat options. WHAT FOODS ARE NOT RECOMMENDED?  High fat foods, such as:  [employer].  Ice cream.  [place] toast.  Sweet rolls.  Pizza.  Cheese  bread.  Foods covered with batter, butter, creamy sauces, or cheese.  Fried foods.  Sugary drinks and desserts.  Foods that cause gas or bloating   This information is not intended to replace advice given to you by your health care provider. Make sure you discuss any questions you have with your health care provider.   Document Released: 01/05/2013 Document Reviewed: 01/05/2013 Elsevier Interactive Patient Education [employer].

## 2018-03-25 ENCOUNTER — Telehealth: Payer: Self-pay | Admitting: *Deleted

## 2018-03-25 NOTE — Telephone Encounter (Signed)
Patient called and wants to hold off on surgery for right now. He will notify the office when he is ready

## 2018-04-17 DIAGNOSIS — H35372 Puckering of macula, left eye: Secondary | ICD-10-CM | POA: Diagnosis not present

## 2018-04-17 DIAGNOSIS — H348322 Tributary (branch) retinal vein occlusion, left eye, stable: Secondary | ICD-10-CM | POA: Diagnosis not present

## 2018-04-17 DIAGNOSIS — H34811 Central retinal vein occlusion, right eye, with macular edema: Secondary | ICD-10-CM | POA: Diagnosis not present

## 2018-04-17 DIAGNOSIS — E113293 Type 2 diabetes mellitus with mild nonproliferative diabetic retinopathy without macular edema, bilateral: Secondary | ICD-10-CM | POA: Diagnosis not present

## 2018-04-21 DIAGNOSIS — K829 Disease of gallbladder, unspecified: Secondary | ICD-10-CM | POA: Diagnosis not present

## 2018-04-21 DIAGNOSIS — I480 Paroxysmal atrial fibrillation: Secondary | ICD-10-CM | POA: Diagnosis not present

## 2018-04-21 DIAGNOSIS — I10 Essential (primary) hypertension: Secondary | ICD-10-CM | POA: Diagnosis not present

## 2018-04-29 NOTE — Telephone Encounter (Signed)
Pt seen 03/04/18.

## 2018-05-04 ENCOUNTER — Encounter: Payer: Self-pay | Admitting: Family Medicine

## 2018-05-04 ENCOUNTER — Other Ambulatory Visit: Payer: Self-pay | Admitting: Family Medicine

## 2018-05-04 DIAGNOSIS — I10 Essential (primary) hypertension: Secondary | ICD-10-CM

## 2018-05-04 DIAGNOSIS — K219 Gastro-esophageal reflux disease without esophagitis: Secondary | ICD-10-CM

## 2018-05-04 MED ORDER — OMEPRAZOLE 40 MG PO CPDR: 40.0000 mg | DELAYED_RELEASE_CAPSULE | Freq: Every day | ORAL | 3 refills | Status: AC

## 2018-05-04 MED ORDER — DILTIAZEM HCL ER COATED BEADS 180 MG PO CP24: 180.0000 mg | ORAL_CAPSULE | Freq: Every day | ORAL | 3 refills | Status: AC

## 2018-05-04 NOTE — Telephone Encounter (Signed)
It looks like he has been seen for other issues, but last seen for BP and DM 10/19. Not sure if he can do VV. I have not done the refills, yet.

## 2018-05-13 ENCOUNTER — Encounter: Payer: Self-pay | Admitting: Family Medicine

## 2018-05-13 ENCOUNTER — Ambulatory Visit (INDEPENDENT_AMBULATORY_CARE_PROVIDER_SITE_OTHER): Payer: Medicare Other | Admitting: Family Medicine

## 2018-05-13 VITALS — BP 136/79 | HR 76 | Ht 71.0 in | Wt 170.0 lb

## 2018-05-13 DIAGNOSIS — M79602 Pain in left arm: Secondary | ICD-10-CM | POA: Diagnosis not present

## 2018-05-13 DIAGNOSIS — E118 Type 2 diabetes mellitus with unspecified complications: Secondary | ICD-10-CM | POA: Diagnosis not present

## 2018-05-13 DIAGNOSIS — I10 Essential (primary) hypertension: Secondary | ICD-10-CM | POA: Diagnosis not present

## 2018-05-13 DIAGNOSIS — D500 Iron deficiency anemia secondary to blood loss (chronic): Secondary | ICD-10-CM | POA: Diagnosis not present

## 2018-05-13 DIAGNOSIS — K8012 Calculus of gallbladder with acute and chronic cholecystitis without obstruction: Secondary | ICD-10-CM

## 2018-05-13 NOTE — Progress Notes (Signed)
Virtual Visit via Telephone Note  I connected with Gabriel Brewer on 05/13/18 at  9:40 AM EDT by telephone and verified that I am speaking with the correct person using two identifiers.  Patient was in his home and I was in my office at [facility].   I discussed the limitations, risks, security and privacy concerns of performing an evaluation and management service by telephone and the availability of in person appointments. I also discussed with the patient that there may be a patient responsible charge related to this service. The patient expressed understanding and agreed to proceed.   History of Present Illness: This is a 78 year old male who agrees to telephone visit for follow-up of chronic medical conditions.  Cholecystitis- patient was evaluated by general surgeon 03/23/2020 and it was recommended that he have his gallbladder removed.  He had a visit with his prior PCP in 1 week for [place] to discuss with him.  Unfortunately it was during this time of COVID-19 and stay at home orders were released.  He is wishing to get a second opinion and will do this when more elective visits are being conducted.  He reports that he has not had any additional abdominal pain.  Iron deficiency anemia- he has follow-up with hematology next week.  He reports that his energy level has been good.  Diabetes mellitus type 2- he continues on metformin 500 mg twice a day.  His blood sugar was 118 this morning.  His weekly average according to his home glucometer is 122.  Headache- a couple of weeks ago he had a headache at the top of his head that lasted all week.  This is highly unusual for him, he has had very few headaches throughout his lifetime.  He denies having any visual changes during the headache.  He did have some elevated blood pressure and elevated blood sugar while experiencing the headache.  The headache spontaneously resolved.  No recurrence.  Left arm pain- patient was seen March 04, 2018  for left arm pain x2 weeks.  He was treated with prednisone taper with complete resolution of symptoms.  He reports that the pain has returned.  It is between his elbow and shoulder more so on the outer part of his arm.  Occasionally will radiate up into his shoulder.  It hurts every day but the pain is intermittent.  It feels like a sore muscle off and on.  Certain movements such as putting on shirt will cause the pain.  He has not taken any medication for pain.  Essential hypertension- when he was seen by his previous PCP last month, his blood pressure was elevated.  He was started on hydrochlorothiazide 12.5 mg daily.  He does check his blood pressure at home, reading below.  He denies any chest pain, palpitations, weakness, shortness of breath.  Past Medical History:  Diagnosis Date  . Cancer ([place]) 2017   skin   . Diabetes mellitus without complication ([place])   . GERD (gastroesophageal reflux disease)   . Hyperlipidemia   . Hypertension    Past Surgical History:  Procedure Laterality Date  . COLONOSCOPY WITH PROPOFOL N/A 11/17/2017   Procedure: COLONOSCOPY WITH PROPOFOL;  Surgeon: Jonathon Bellows, MD;  Location: [facility] ENDOSCOPY;  Service: Gastroenterology;  Laterality: N/A;  . ESOPHAGOGASTRODUODENOSCOPY (EGD) WITH PROPOFOL N/A 11/17/2017   Procedure: ESOPHAGOGASTRODUODENOSCOPY (EGD) WITH PROPOFOL;  Surgeon: Jonathon Bellows, MD;  Location: [facility] ENDOSCOPY;  Service: Gastroenterology;  Laterality: N/A;  . GIVENS CAPSULE STUDY N/A 12/09/2017  Procedure: GIVENS CAPSULE STUDY;  Surgeon: Jonathon Bellows, MD;  Location: [facility] ENDOSCOPY;  Service: Gastroenterology;  Laterality: N/A;  . TONSILLECTOMY     Family History  Problem Relation Age of Onset  . Cerebral aneurysm Mother   . Heart disease Father   . COPD Father   . Breast cancer Sister   . Cancer Brother        unknowsn   Social History   Tobacco Use  . Smoking status: Former Smoker    Packs/day: 0.50    Years: 20.00    Pack years: 10.00     Types: Cigarettes    Last attempt to quit: 11/20/1982    Years since quitting: 35.5  . Smokeless tobacco: Never Used  Substance Use Topics  . Alcohol use: No  . Drug use: No      Observations/Objective: The patient is alert and answers questions appropriately.  He is normally conversive.  He does not sound short of breath.  His mood and affect are appropriate.  BP 136/79 Comment: per patient  Pulse 76 Comment: per patient  Ht 5\' 11"  (1.803 m)   Wt 170 lb (77.1 kg) Comment: per patient  BMI 23.71 kg/m  BP Readings from Last 3 Encounters:  05/13/18 136/79  03/24/18 (!) 142/74  03/16/18 108/64   Wt Readings from Last 3 Encounters:  05/13/18 170 lb (77.1 kg)  03/24/18 169 lb (76.7 kg)  03/16/18 169 lb 12.8 oz (77 kg)     Assessment and Plan: 1. Left arm pain - discussed with Dr. Lorelei Pont (sports medicine) who agreed to see patient if patient is interested for possible injection. Would likely benefit from PT but unable to get at this time due to COVID-19. Will offer this as an option to patient in mychart recap.   2. Essential hypertension -Currently well controlled, he has an office visit with hematology next week, will continue to monitor blood pressure.  He is having labs drawn next week as well  3. Iron deficiency anemia due to chronic blood loss -Follow-up as scheduled with hematology  4. Calculus of gallbladder with acute on chronic cholecystitis without obstruction -He is working on getting a second opinion.  He is currently asymptomatic and following a low-fat diet.  5. Controlled type 2 diabetes mellitus with complication, without long-term current use of insulin ([place]) -Well-controlled on current medication, will recheck labs in 3 to 4 months   Clarene Reamer, FNP-BC  [facility] at [facility] [place]  05/13/2018 10:19 AM    Follow Up Instructions: Recap of visit and instructions sent to patient via mychart.   I discussed the  assessment and treatment plan with the patient. The patient was provided an opportunity to ask questions and all were answered. The patient agreed with the plan and demonstrated an understanding of the instructions.   The patient was advised to call back or seek an in-person evaluation if the symptoms worsen or if the condition fails to improve as anticipated.  I provided 11 minutes of non-face-to-face time during this encounter.   Elby Beck, FNP

## 2018-05-15 ENCOUNTER — Ambulatory Visit: Payer: Medicare Other | Admitting: Family Medicine

## 2018-05-16 ENCOUNTER — Other Ambulatory Visit: Payer: Self-pay | Admitting: Family Medicine

## 2018-05-18 ENCOUNTER — Ambulatory Visit (INDEPENDENT_AMBULATORY_CARE_PROVIDER_SITE_OTHER): Payer: Medicare Other | Admitting: Family Medicine

## 2018-05-18 ENCOUNTER — Ambulatory Visit (INDEPENDENT_AMBULATORY_CARE_PROVIDER_SITE_OTHER)
Admission: RE | Admit: 2018-05-18 | Discharge: 2018-05-18 | Disposition: A | Discharge status: Home / Self Care | Payer: Medicare Other | Source: Ambulatory Visit | Attending: Family Medicine | Admitting: Family Medicine

## 2018-05-18 ENCOUNTER — Other Ambulatory Visit: Payer: Self-pay

## 2018-05-18 ENCOUNTER — Encounter: Payer: Self-pay | Admitting: Family Medicine

## 2018-05-18 VITALS — BP 110/72 | HR 82 | Temp 97.7°F | Ht 71.0 in | Wt 169.5 lb

## 2018-05-18 DIAGNOSIS — M7582 Other shoulder lesions, left shoulder: Secondary | ICD-10-CM | POA: Diagnosis not present

## 2018-05-18 DIAGNOSIS — M19012 Primary osteoarthritis, left shoulder: Secondary | ICD-10-CM | POA: Diagnosis not present

## 2018-05-18 DIAGNOSIS — M25512 Pain in left shoulder: Secondary | ICD-10-CM

## 2018-05-18 DIAGNOSIS — M7542 Impingement syndrome of left shoulder: Secondary | ICD-10-CM | POA: Diagnosis not present

## 2018-05-18 DIAGNOSIS — M19019 Primary osteoarthritis, unspecified shoulder: Secondary | ICD-10-CM

## 2018-05-18 DIAGNOSIS — M7552 Bursitis of left shoulder: Secondary | ICD-10-CM | POA: Diagnosis not present

## 2018-05-18 MED ORDER — METHYLPREDNISOLONE ACETATE 40 MG/ML IJ SUSP
80.0000 mg | Freq: Once | INTRAMUSCULAR | Status: AC
  Administered 2018-05-18: 80 mg via INTRA_ARTICULAR

## 2018-05-18 NOTE — Progress Notes (Signed)
Gabriel Montone T. Kwinton Maahs, MD Primary Care and Sports Medicine [facility] at [facility] [place], [address] Phone: [phone]  FAX: [phone]  Gabriel Brewer - 78 y.o. male  MRN [id number]  Date of Birth: [birth year]  Visit Date: 05/18/2018  PCP: Elby Beck, FNP  Referred by: Elby Beck, FNP  Chief Complaint  Patient presents with  . Arm Pain    Left   Subjective:   Gabriel Brewer is a 78 y.o. very pleasant male patient who presents with the following:  L arm / shoulder pain:  Initial evaluation in 02/2018 and treated with prednisone taper.  Arm for a while pain, arm got better with prednisone, but it has recurred in a t-shirt distribution.  Doing some rehab and pain was gone and seemed ok.  Putting on a jacket hurts.- reaching between.   Initially, he was having some pain going down his arm and was some tingling in his hand, but that is been resolved for some time.  Did take some oral prednisone initially in February.  Throughout this time, he has had trouble and pain with abduction and a painful arc of motion, as well as reaching behind him, and internal range of motion.  He has not had any prior major injuries to this shoulder including no dislocations, fractures, or prior surgery.  No prior injury.   Mild oa subac subcora AAOS L intra and subac conj  Past Medical History, Surgical History, Social History, Family History, Problem List, Medications, and Allergies have been reviewed and updated if relevant.  Patient Active Problem List   Diagnosis Date Noted  . Iron deficiency anemia 11/14/2017  . Elevated prostate specific antigen (PSA) 11/12/2017  . Esophageal reflux 11/12/2017  . Family history of malignant neoplasm of prostate 11/12/2017  . Low back pain 11/12/2017  . Vitamin D deficiency 11/12/2017  . Pneumonia 11/03/2016  . Endocarditis of mitral valve 09/27/2015  . Mitral regurgitation 09/27/2015  . Essential  hypertension 08/18/2015  . Skin cancer of face 02/06/2015  . Mixed hyperlipidemia 10/22/2014    Past Medical History:  Diagnosis Date  . Cancer ([place]) 2017   skin   . Diabetes mellitus without complication ([place])   . GERD (gastroesophageal reflux disease)   . Hyperlipidemia   . Hypertension     Past Surgical History:  Procedure Laterality Date  . COLONOSCOPY WITH PROPOFOL N/A 11/17/2017   Procedure: COLONOSCOPY WITH PROPOFOL;  Surgeon: Jonathon Bellows, MD;  Location: [facility] ENDOSCOPY;  Service: Gastroenterology;  Laterality: N/A;  . ESOPHAGOGASTRODUODENOSCOPY (EGD) WITH PROPOFOL N/A 11/17/2017   Procedure: ESOPHAGOGASTRODUODENOSCOPY (EGD) WITH PROPOFOL;  Surgeon: Jonathon Bellows, MD;  Location: [facility] ENDOSCOPY;  Service: Gastroenterology;  Laterality: N/A;  . GIVENS CAPSULE STUDY N/A 12/09/2017   Procedure: GIVENS CAPSULE STUDY;  Surgeon: Jonathon Bellows, MD;  Location: [facility] ENDOSCOPY;  Service: Gastroenterology;  Laterality: N/A;  . TONSILLECTOMY      Social History   Socioeconomic History  . Marital status: Married    Spouse name: Not on file  . Number of children: Not on file  . Years of education: Not on file  . Highest education level: Not on file  Occupational History  . Occupation: retired  Scientific laboratory technician  . Financial resource strain: Not on file  . Food insecurity:    Worry: Not on file    Inability: Not on file  . Transportation needs:    Medical: Not on file    Non-medical: Not on file  Tobacco Use  . Smoking status: Former Smoker    Packs/day: 0.50    Years: 20.00    Pack years: 10.00    Types: Cigarettes    Last attempt to quit: 11/20/1982    Years since quitting: 35.5  . Smokeless tobacco: Never Used  Substance and Sexual Activity  . Alcohol use: No  . Drug use: No  . Sexual activity: Not Currently    Partners: Female  Lifestyle  . Physical activity:    Days per week: Not on file    Minutes per session: Not on file  . Stress: Not on file  Relationships  . Social  connections:    Talks on phone: Not on file    Gets together: Not on file    Attends religious service: Not on file    Active member of club or organization: Not on file    Attends meetings of clubs or organizations: Not on file    Relationship status: Not on file  . Intimate partner violence:    Fear of current or ex partner: Not on file    Emotionally abused: Not on file    Physically abused: Not on file    Forced sexual activity: Not on file  Other Topics Concern  . Not on file  Social History Narrative  . Not on file    Family History  Problem Relation Age of Onset  . Cerebral aneurysm Mother   . Heart disease Father   . COPD Father   . Breast cancer Sister   . Cancer Brother        unknowsn    No Known Allergies  Medication list reviewed and updated in full in [place].  GEN: No fevers, chills. Nontoxic. Primarily MSK c/o today. MSK: Detailed in the HPI GI: tolerating PO intake without difficulty Neuro: No numbness, parasthesias, or tingling associated. Otherwise the pertinent positives of the ROS are noted above.   Objective:   BP 110/72   Pulse 82   Temp 97.7 F (36.5 C) (Oral)   Ht 5\' 11"  (1.803 m)   Wt 169 lb 8 oz (76.9 kg)   BMI 23.64 kg/m    GEN: WDWN, NAD, Non-toxic, Alert & Oriented x 3 HEENT: Atraumatic, Normocephalic.  Ears and Nose: No external deformity. EXTR: No clubbing/cyanosis/edema NEURO: Normal gait.  PSYCH: Normally interactive. Conversant. Not depressed or anxious appearing.  Calm demeanor.    His neck range of motion is excellent in all directions for age.  Spurling's maneuver is negative, and neurovascularly intact.  No pain on the clavicle inspection, nontender at the [facility] joint and nontender in the bicipital groove.  He does have some significant crepitus with motion.  Range of motion is grossly normal with a painful arc of motion.  When checking range of motion in 90 degrees of abduction, the patient is unable to  tolerate this and has significant pain with external range of motion.  Strength is 5/5 in all directions.  Crossover is markedly positive. Neer test is positive and Michel Bickers testing is positive.  Radiology: Dg Shoulder Left  Result Date: 05/18/2018 CLINICAL DATA:  Left shoulder pain. EXAM: LEFT SHOULDER - 2+ VIEW COMPARISON:  None. FINDINGS: No acute bony or joint abnormality is identified. The patient has mild acromioclavicular osteoarthritis. No subacromial spurring is seen. No focal bony lesion. Imaged right lung and ribs appear normal. IMPRESSION: Mild appearing acromioclavicular osteoarthritis. Otherwise negative. Electronically Signed   By: Inge Rise M.D.  On: 05/18/2018 11:59    Assessment and Plan:   Impingement syndrome of left shoulder  Acute pain of left shoulder - Plan: DG Shoulder Left, methylPREDNISolone acetate (DEPO-MEDROL) injection 80 mg  Rotator cuff tendonitis, left  Subacromial bursitis of left shoulder joint  Subcoracoid bursitis of left shoulder  AC joint arthropathy  Multipart shoulder pain with cuff tendinopathy, impingement at the [facility] joint as well as the coracoid.  Mild AC arthropathy is also likely causing pain as well.  [place] rehab. A rehabilitation program from the [employer] [facility] was reviewed with the patient face to face for their condition.   I am going to do a injection of corticosteroid to try to bring him some relief now, and start doing more rehab and range of motion.  Aspiration/Injection Procedure Note Lori Liew [birth year] Date of procedure: 05/18/2018  Procedure: Large Joint Aspiration / Injection of Shoulder, Intraarticular, Left Indications: Pain  Procedure Details Verbal consent was obtained from the patient. Risks including infection explained and contrasted with benefits and alternatives. Patient prepped with Chloraprep and Ethyl Chloride used for anesthesia. An intraarticular shoulder  injection was performed using the posterior approach. The patient tolerated the procedure well and had decreased pain post injection. No complications. Injection: 4 cc of Lidocaine 1% and 1 mL Depo-Medrol 40 mg. Needle: 21 gauge, 2 inch Medication: Depo-Medrol 40 mg  Aspiration/Injection Procedure Note Sergey Ishler 12-31-1940 Date of procedure: 05/18/2018  Procedure: Large Joint Aspiration / Injection of Shoulder, Subacromial, Left Indications: Pain  Procedure Details Verbal consent was obtained from the patient. Risks (including rare infection), benefits, and alternatives were explained. Patient prepped with Chloraprep and Ethyl Chloride used for anesthesia. The subacromial space was injected using the posterior approach. The patient tolerated the procedure well and had decreased pain post injection. No complications. Injection: 4 cc of Lidocaine 1% and 1 mL of Depo-Medrol 40 mg. Needle: 22 gauge, 1 1/2 inch Medication: Depo-Medrol 40 mg  Follow-up: 2 mo if not improved  Meds ordered this encounter  Medications  . methylPREDNISolone acetate (DEPO-MEDROL) injection 80 mg   Orders Placed This Encounter  Procedures  . DG Shoulder Left    Signed,  Frederico Hamman T. Anyla Israelson, MD   Outpatient Encounter Medications as of 05/18/2018  Medication Sig  . atorvastatin (LIPITOR) 10 MG tablet Take 1 tablet (10 mg total) by mouth daily.  . benazepril (LOTENSIN) 40 MG tablet Take 1 tablet (40 mg total) by mouth every evening.  . Cholecalciferol 4000 units CAPS Take 1 capsule by mouth every 7 (seven) days.  Marland Kitchen diltiazem (CARDIZEM CD) 180 MG 24 hr capsule Take 1 capsule (180 mg total) by mouth daily.  . fluticasone (FLONASE) 50 MCG/ACT nasal spray as needed.   Marland Kitchen glucose blood (ONE TOUCH ULTRA TEST) test strip TEST ONCE DAILY  . hydrochlorothiazide (HYDRODIURIL) 12.5 MG tablet Take 1 tablet by mouth daily.  . metFORMIN (GLUCOPHAGE) 500 MG tablet TAKE 1 TABLET(500 MG) BY MOUTH TWICE DAILY  . omeprazole  (PRILOSEC) 40 MG capsule Take 1 capsule (40 mg total) by mouth daily.  . rivaroxaban (XARELTO) 20 MG TABS tablet Take 1 tablet (20 mg total) by mouth daily with supper.  . [EXPIRED] methylPREDNISolone acetate (DEPO-MEDROL) injection 80 mg    No facility-administered encounter medications on file as of 05/18/2018.

## 2018-05-21 ENCOUNTER — Ambulatory Visit: Payer: Self-pay | Admitting: General Surgery

## 2018-05-21 DIAGNOSIS — Z7902 Long term (current) use of antithrombotics/antiplatelets: Secondary | ICD-10-CM | POA: Diagnosis not present

## 2018-05-21 DIAGNOSIS — K8020 Calculus of gallbladder without cholecystitis without obstruction: Secondary | ICD-10-CM | POA: Diagnosis not present

## 2018-05-21 DIAGNOSIS — I10 Essential (primary) hypertension: Secondary | ICD-10-CM | POA: Diagnosis not present

## 2018-05-21 DIAGNOSIS — I480 Paroxysmal atrial fibrillation: Secondary | ICD-10-CM | POA: Diagnosis not present

## 2018-05-24 ENCOUNTER — Other Ambulatory Visit: Payer: Self-pay

## 2018-05-25 ENCOUNTER — Inpatient Hospital Stay: Payer: Medicare Other | Attending: Oncology

## 2018-05-25 ENCOUNTER — Other Ambulatory Visit: Payer: Self-pay

## 2018-05-25 DIAGNOSIS — Q2733 Arteriovenous malformation of digestive system vessel: Secondary | ICD-10-CM | POA: Diagnosis not present

## 2018-05-25 DIAGNOSIS — Z7901 Long term (current) use of anticoagulants: Secondary | ICD-10-CM | POA: Diagnosis not present

## 2018-05-25 DIAGNOSIS — Z7984 Long term (current) use of oral hypoglycemic drugs: Secondary | ICD-10-CM | POA: Diagnosis not present

## 2018-05-25 DIAGNOSIS — E785 Hyperlipidemia, unspecified: Secondary | ICD-10-CM | POA: Diagnosis not present

## 2018-05-25 DIAGNOSIS — Z809 Family history of malignant neoplasm, unspecified: Secondary | ICD-10-CM | POA: Diagnosis not present

## 2018-05-25 DIAGNOSIS — E119 Type 2 diabetes mellitus without complications: Secondary | ICD-10-CM | POA: Diagnosis not present

## 2018-05-25 DIAGNOSIS — K219 Gastro-esophageal reflux disease without esophagitis: Secondary | ICD-10-CM | POA: Diagnosis not present

## 2018-05-25 DIAGNOSIS — Z803 Family history of malignant neoplasm of breast: Secondary | ICD-10-CM | POA: Diagnosis not present

## 2018-05-25 DIAGNOSIS — D500 Iron deficiency anemia secondary to blood loss (chronic): Secondary | ICD-10-CM | POA: Diagnosis not present

## 2018-05-25 DIAGNOSIS — I10 Essential (primary) hypertension: Secondary | ICD-10-CM | POA: Insufficient documentation

## 2018-05-25 DIAGNOSIS — Z8249 Family history of ischemic heart disease and other diseases of the circulatory system: Secondary | ICD-10-CM | POA: Diagnosis not present

## 2018-05-25 DIAGNOSIS — Z79899 Other long term (current) drug therapy: Secondary | ICD-10-CM | POA: Diagnosis not present

## 2018-05-25 DIAGNOSIS — Z85828 Personal history of other malignant neoplasm of skin: Secondary | ICD-10-CM | POA: Insufficient documentation

## 2018-05-25 DIAGNOSIS — Z87891 Personal history of nicotine dependence: Secondary | ICD-10-CM | POA: Diagnosis not present

## 2018-05-25 LAB — CBC WITH DIFFERENTIAL/PLATELET
Abs Immature Granulocytes: 0.06 10*3/uL (ref 0.00–0.07)
Basophils Absolute: 0.1 10*3/uL (ref 0.0–0.1)
Basophils Relative: 1 %
Eosinophils Absolute: 0.1 10*3/uL (ref 0.0–0.5)
Eosinophils Relative: 1 %
HCT: 47.6 % (ref 39.0–52.0)
Hemoglobin: 15.9 g/dL (ref 13.0–17.0)
Immature Granulocytes: 1 %
Lymphocytes Relative: 28 %
Lymphs Abs: 2.5 10*3/uL (ref 0.7–4.0)
MCH: 28.2 pg (ref 26.0–34.0)
MCHC: 33.4 g/dL (ref 30.0–36.0)
MCV: 84.5 fL (ref 80.0–100.0)
Monocytes Absolute: 0.7 10*3/uL (ref 0.1–1.0)
Monocytes Relative: 8 %
Neutro Abs: 5.4 10*3/uL (ref 1.7–7.7)
Neutrophils Relative %: 61 %
Platelets: 207 10*3/uL (ref 150–400)
RBC: 5.63 MIL/uL (ref 4.22–5.81)
RDW: 16.1 % — ABNORMAL HIGH (ref 11.5–15.5)
WBC: 8.8 10*3/uL (ref 4.0–10.5)
nRBC: 0 % (ref 0.0–0.2)

## 2018-05-25 LAB — IRON AND TIBC
Iron: 110 ug/dL (ref 45–182)
Saturation Ratios: 32 % (ref 17.9–39.5)
TIBC: 343 ug/dL (ref 250–450)
UIBC: 233 ug/dL

## 2018-05-25 LAB — FERRITIN: Ferritin: 40 ng/mL (ref 24–336)

## 2018-05-26 ENCOUNTER — Other Ambulatory Visit: Payer: Self-pay

## 2018-05-27 ENCOUNTER — Inpatient Hospital Stay (HOSPITAL_BASED_OUTPATIENT_CLINIC_OR_DEPARTMENT_OTHER): Payer: Medicare Other | Admitting: Oncology

## 2018-05-27 ENCOUNTER — Encounter: Payer: Self-pay | Admitting: Oncology

## 2018-05-27 ENCOUNTER — Inpatient Hospital Stay: Payer: Medicare Other

## 2018-05-27 DIAGNOSIS — Z7901 Long term (current) use of anticoagulants: Secondary | ICD-10-CM | POA: Diagnosis not present

## 2018-05-27 DIAGNOSIS — D500 Iron deficiency anemia secondary to blood loss (chronic): Secondary | ICD-10-CM

## 2018-05-27 DIAGNOSIS — Q2730 Arteriovenous malformation, site unspecified: Secondary | ICD-10-CM

## 2018-05-27 NOTE — Progress Notes (Signed)
Patient contacted for telehealth visit. No new concerns voiced.  

## 2018-05-27 NOTE — Progress Notes (Signed)
HEMATOLOGY-ONCOLOGY TeleHEALTH VISIT PROGRESS NOTE  I connected with Gabriel Brewer on 05/27/18 at  1:00 PM EDT by video enabled telemedicine visit and verified that I am speaking with the correct person using two identifiers. I discussed the limitations, risks, security and privacy concerns of performing an evaluation and management service by telemedicine and the availability of in-person appointments. I also discussed with the patient that there may be a patient responsible charge related to this service. The patient expressed understanding and agreed to proceed.   Other persons participating in the visit and their role in the encounter:  Geraldine Solar, CMA, check in patient    Patient's location: Home  Provider's location: Home office Chief Complaint: 4 months follow-up for management of iron deficiency anemia.   INTERVAL HISTORY Gabriel Brewer is a 78 y.o. male who has above history reviewed by me today presents for follow up visit for management of iron deficiency anemia Problems and complaints are listed below:  Patient was last seen by me on 01/23/2018.  Patient status post IV Venofer 200 mg weekly x4. Since then patient reports feeling well with significantly improved fatigue level. Patient previously had endoscopy and colonoscopy done which showed nonbleeding hemorrhoids, diverticulosis, polyps, hiatal hernia otherwise normal.  Patient also had capsule study on 12/17/2017 with findings of small proximal small bowel AVM nonbleeding.  Patient has not had a ablation done yet. Today he has no new complaints.  Review of Systems  Constitutional: Negative for appetite change, chills, fatigue, fever and unexpected weight change.  HENT:   Negative for hearing loss and voice change.   Eyes: Negative for eye problems and icterus.  Respiratory: Negative for chest tightness, cough and shortness of breath.   Cardiovascular: Negative for chest pain and leg swelling.  Gastrointestinal: Negative for  abdominal distention and abdominal pain.  Endocrine: Negative for hot flashes.  Genitourinary: Negative for difficulty urinating, dysuria and frequency.   Musculoskeletal: Positive for arthralgias.  Skin: Negative for itching and rash.  Neurological: Negative for light-headedness and numbness.  Hematological: Negative for adenopathy. Does not bruise/bleed easily.  Psychiatric/Behavioral: Negative for confusion.    Past Medical History:  Diagnosis Date  . Cancer ([place]) 2017   skin   . Diabetes mellitus without complication ([place])   . GERD (gastroesophageal reflux disease)   . Hyperlipidemia   . Hypertension    Past Surgical History:  Procedure Laterality Date  . COLONOSCOPY WITH PROPOFOL N/A 11/17/2017   Procedure: COLONOSCOPY WITH PROPOFOL;  Surgeon: Jonathon Bellows, MD;  Location: [facility] ENDOSCOPY;  Service: Gastroenterology;  Laterality: N/A;  . ESOPHAGOGASTRODUODENOSCOPY (EGD) WITH PROPOFOL N/A 11/17/2017   Procedure: ESOPHAGOGASTRODUODENOSCOPY (EGD) WITH PROPOFOL;  Surgeon: Jonathon Bellows, MD;  Location: [facility] ENDOSCOPY;  Service: Gastroenterology;  Laterality: N/A;  . GIVENS CAPSULE STUDY N/A 12/09/2017   Procedure: GIVENS CAPSULE STUDY;  Surgeon: Jonathon Bellows, MD;  Location: [facility] ENDOSCOPY;  Service: Gastroenterology;  Laterality: N/A;  . TONSILLECTOMY      Family History  Problem Relation Age of Onset  . Cerebral aneurysm Mother   . Heart disease Father   . COPD Father   . Breast cancer Sister   . Cancer Brother        unknowsn    Social History   Socioeconomic History  . Marital status: Married    Spouse name: Not on file  . Number of children: Not on file  . Years of education: Not on file  . Highest education level: Not on file  Occupational History  . Occupation:  retired  Scientific laboratory technician  . Financial resource strain: Not on file  . Food insecurity:    Worry: Not on file    Inability: Not on file  . Transportation needs:    Medical: Not on file    Non-medical: Not on  file  Tobacco Use  . Smoking status: Former Smoker    Packs/day: 0.50    Years: 20.00    Pack years: 10.00    Types: Cigarettes    Last attempt to quit: 11/20/1982    Years since quitting: 35.5  . Smokeless tobacco: Never Used  Substance and Sexual Activity  . Alcohol use: No  . Drug use: No  . Sexual activity: Not Currently    Partners: Female  Lifestyle  . Physical activity:    Days per week: Not on file    Minutes per session: Not on file  . Stress: Not on file  Relationships  . Social connections:    Talks on phone: Not on file    Gets together: Not on file    Attends religious service: Not on file    Active member of club or organization: Not on file    Attends meetings of clubs or organizations: Not on file    Relationship status: Not on file  . Intimate partner violence:    Fear of current or ex partner: Not on file    Emotionally abused: Not on file    Physically abused: Not on file    Forced sexual activity: Not on file  Other Topics Concern  . Not on file  Social History Narrative  . Not on file    Current Outpatient Medications on File Prior to Visit  Medication Sig Dispense Refill  . atorvastatin (LIPITOR) 10 MG tablet Take 1 tablet (10 mg total) by mouth daily. 90 tablet 3  . benazepril (LOTENSIN) 40 MG tablet Take 1 tablet (40 mg total) by mouth every evening. 30 tablet 2  . Cholecalciferol 4000 units CAPS Take 1 capsule by mouth every 7 (seven) days.    Marland Kitchen diltiazem (CARDIZEM CD) 180 MG 24 hr capsule Take 1 capsule (180 mg total) by mouth daily. 90 capsule 3  . fluticasone (FLONASE) 50 MCG/ACT nasal spray as needed.     Marland Kitchen glucose blood (ONE TOUCH ULTRA TEST) test strip TEST ONCE DAILY    . hydrochlorothiazide (HYDRODIURIL) 12.5 MG tablet Take 1 tablet by mouth daily.    . metFORMIN (GLUCOPHAGE) 500 MG tablet TAKE 1 TABLET(500 MG) BY MOUTH TWICE DAILY 180 tablet 0  . omeprazole (PRILOSEC) 40 MG capsule Take 1 capsule (40 mg total) by mouth daily. 90 capsule  3  . rivaroxaban (XARELTO) 20 MG TABS tablet Take 1 tablet (20 mg total) by mouth daily with supper. 30 tablet 2   No current facility-administered medications on file prior to visit.     No Known Allergies     Observations/Objective: There were no vitals filed for this visit. There is no height or weight on file to calculate BMI.  Physical Exam  CBC    Component Value Date/Time   WBC 8.8 05/25/2018 0938   RBC 5.63 05/25/2018 0938   HGB 15.9 05/25/2018 0938   HCT 47.6 05/25/2018 0938   PLT 207 05/25/2018 0938   MCV 84.5 05/25/2018 0938   MCH 28.2 05/25/2018 0938   MCHC 33.4 05/25/2018 0938   RDW 16.1 (H) 05/25/2018 0938   LYMPHSABS 2.5 05/25/2018 0938   MONOABS 0.7 05/25/2018 0938   EOSABS  0.1 05/25/2018 0938   BASOSABS 0.1 05/25/2018 0938    CMP     Component Value Date/Time   NA 136 03/13/2018 1957   K 3.9 03/13/2018 1957   CL 103 03/13/2018 1957   CO2 24 03/13/2018 1957   GLUCOSE 143 (H) 03/13/2018 1957   BUN 26 (H) 03/13/2018 1957   CREATININE 1.07 03/13/2018 1957   CALCIUM 8.9 03/13/2018 1957   PROT 7.4 03/16/2018 1108   ALBUMIN 4.2 03/16/2018 1108   AST 16 03/16/2018 1108   ALT 14 03/16/2018 1108   ALKPHOS 68 03/16/2018 1108   BILITOT 1.1 03/16/2018 1108   GFRNONAA >60 03/13/2018 1957   GFRAA >60 03/13/2018 1957     Assessment and Plan: 1. Iron deficiency anemia due to chronic blood loss   2. Chronic anticoagulation   3. AVM (arteriovenous malformation)     Labs reviewed and discussed with patient. His hemoglobin remained stable at 15.9, iron panel shows ferritin 40, TIBC 343, iron saturation 32. The studies are consistent with adequate iron stores. Hold additional IV Venofer treatments. Patient is chronic anticoagulation with Xarelto. Small AVM, not ablated yet. Recommend patient to continue monitor his counts.  Follow Up Instructions: Lab MD 4 months, labs prior.+/- Venofer.    I discussed the assessment and treatment plan with the patient.  The patient was provided an opportunity to ask questions and all were answered. The patient agreed with the plan and demonstrated an understanding of the instructions.  The patient was advised to call back or seek an in-person evaluation if the symptoms worsen or if the condition fails to improve as anticipated.   I provided 15 minutes of non face-to-face telephone visit time during this encounter, and > 50% was spent counseling as documented under my assessment & plan.  Earlie Server, MD 05/27/2018 9:56 PM

## 2018-06-05 DIAGNOSIS — H34811 Central retinal vein occlusion, right eye, with macular edema: Secondary | ICD-10-CM | POA: Diagnosis not present

## 2018-06-05 DIAGNOSIS — H348322 Tributary (branch) retinal vein occlusion, left eye, stable: Secondary | ICD-10-CM | POA: Diagnosis not present

## 2018-07-13 DIAGNOSIS — I480 Paroxysmal atrial fibrillation: Secondary | ICD-10-CM | POA: Diagnosis not present

## 2018-07-13 DIAGNOSIS — I10 Essential (primary) hypertension: Secondary | ICD-10-CM | POA: Diagnosis not present

## 2018-07-13 DIAGNOSIS — I340 Nonrheumatic mitral (valve) insufficiency: Secondary | ICD-10-CM | POA: Diagnosis not present

## 2018-07-20 DIAGNOSIS — D485 Neoplasm of uncertain behavior of skin: Secondary | ICD-10-CM | POA: Diagnosis not present

## 2018-07-20 DIAGNOSIS — X32XXXA Exposure to sunlight, initial encounter: Secondary | ICD-10-CM | POA: Diagnosis not present

## 2018-07-20 DIAGNOSIS — D2272 Melanocytic nevi of left lower limb, including hip: Secondary | ICD-10-CM | POA: Diagnosis not present

## 2018-07-20 DIAGNOSIS — D0439 Carcinoma in situ of skin of other parts of face: Secondary | ICD-10-CM | POA: Diagnosis not present

## 2018-07-20 DIAGNOSIS — D225 Melanocytic nevi of trunk: Secondary | ICD-10-CM | POA: Diagnosis not present

## 2018-07-20 DIAGNOSIS — D2271 Melanocytic nevi of right lower limb, including hip: Secondary | ICD-10-CM | POA: Diagnosis not present

## 2018-07-20 DIAGNOSIS — L821 Other seborrheic keratosis: Secondary | ICD-10-CM | POA: Diagnosis not present

## 2018-07-20 DIAGNOSIS — L570 Actinic keratosis: Secondary | ICD-10-CM | POA: Diagnosis not present

## 2018-07-20 DIAGNOSIS — D0462 Carcinoma in situ of skin of left upper limb, including shoulder: Secondary | ICD-10-CM | POA: Diagnosis not present

## 2018-07-23 NOTE — Patient Instructions (Addendum)
YOU HAD  NEED TO HAVE A COVID 19 TEST ON 07-27-2018,  ONCE YOUR COVID TEST IS COMPLETED, PLEASE BEGIN THE QUARANTINE INSTRUCTIONS AS OUTLINED IN YOUR HANDOUT.                Your procedure is scheduled on: 07-30-2018   Report to [facility] Main  Entrance    Report to Admitting at 11:00  AM     Call this number if you have problems the morning of surgery [phone]    Remember: Do not eat food  :After Midnight. CLEAR LIQUIDS FROM MIDNIGHT UNTIL 7:00 AM. NOTHING BY MOUTH AFTER 7:00 AM.       CLEAR LIQUID DIET   Foods Allowed                                                                     Foods Excluded  Coffee and tea, regular and decaf                             liquids that you cannot  Plain Jell-O in any flavor                                             see through such as: Fruit ices (not with fruit pulp)                                     milk, soups, orange juice  Iced Popsicles                                    All solid food Carbonated beverages, regular and diet                                    Cranberry, grape and apple juices Sports drinks like Gatorade Lightly seasoned clear broth or consume(fat free) Sugar, honey syrup  Sample Menu Breakfast                                Lunch                                     Supper Cranberry juice                    Beef broth                            Chicken broth Jell-O                                     Grape juice  Apple juice Coffee or tea                        Jell-O                                      Popsicle                                                Coffee or tea                        Coffee or tea  _____________________________________________________________________     Take these medicines the morning of surgery with A SIP OF WATER: Atorvastatin (Lipitor), Diltiazem (Cardizem), and Omeprazole (Prilosec)  BRUSH YOUR TEETH MORNING OF SURGERY AND RINSE  YOUR MOUTH OUT, NO CHEWING GUM CANDY OR MINTS.               You may not have any metal on your body including hair pins and              piercings     Do not wear jewelry, cologne,  lotions, powders or deodorant                           Men may shave face and neck.   Do not bring valuables to the hospital. [place].  Contacts, dentures or bridgework may not be worn into surgery.     Patients discharged the day of surgery will not be allowed to drive home. IF YOU ARE HAVING SURGERY AND GOING HOME THE SAME DAY, YOU MUST HAVE AN ADULT TO DRIVE YOU HOME AND BE WITH YOU FOR 24 HOURS. YOU MAY GO HOME BY TAXI OR UBER OR ORTHERWISE, BUT AN ADULT MUST ACCOMPANY YOU HOME AND STAY WITH YOU FOR 24 HOURS.  Name and phone number of your driver: Denvil Canning [phone]  Special Instructions: N/A              Please read over the following fact sheets you were given: _____________________________________________________________________   DO NOT TAKE ANY DIABETIC MEDICATIONS DAY OF YOUR SURGERY   How to Manage Your Diabetes Before and After Surgery  Why is it important to control my blood sugar before and after surgery? . Improving blood sugar levels before and after surgery helps healing and can limit problems. . A way of improving blood sugar control is eating a healthy diet by: o  Eating less sugar and carbohydrates o  Increasing activity/exercise o  Talking with your doctor about reaching your blood sugar goals . High blood sugars (greater than 180 mg/dL) can raise your risk of infections and slow your recovery, so you will need to focus on controlling your diabetes during the weeks before surgery. . Make sure that the doctor who takes care of your diabetes knows about your planned surgery including the date and location.  How do I manage my blood sugar before surgery? . Check your blood sugar at least 4 times a day, starting 2 days before  surgery, to make sure that the level is  not too high or low. o Check your blood sugar the morning of your surgery when you wake up and every 2 hours until you get to the Short Stay unit. . If your blood sugar is less than 70 mg/dL, you will need to treat for low blood sugar: o Do not take insulin. o Treat a low blood sugar (less than 70 mg/dL) with  cup of clear juice (cranberry or apple), 4 glucose tablets, OR glucose gel. o Recheck blood sugar in 15 minutes after treatment (to make sure it is greater than 70 mg/dL). If your blood sugar is not greater than 70 mg/dL on recheck, call [phone] for further instructions. . Report your blood sugar to the short stay nurse when you get to Short Stay.  . If you are admitted to the hospital after surgery: o Your blood sugar will be checked by the staff and you will probably be given insulin after surgery (instead of oral diabetes medicines) to make sure you have good blood sugar levels. o The goal for blood sugar control after surgery is 80-180 mg/dL.   WHAT DO I DO ABOUT MY DIABETES MEDICATION?  Marland Kitchen Do not take oral diabetes medicines (pills) the morning of surgery.  . THE DAY  BEFORE SURGERY TAKE METFORMIN AS USUAL.      THE MORNING OF SURGERY DO NOT TAKE METFORMIN.     Reviewed and Endorsed by [facility] Patient Education Committee, August 2015                         [facility] - Preparing for Surgery Before surgery, you can play an important role.  Because skin is not sterile, your skin needs to be as free of germs as possible.  You can reduce the number of germs on your skin by washing with CHG (chlorahexidine gluconate) soap before surgery.  CHG is an antiseptic cleaner which kills germs and bonds with the skin to continue killing germs even after washing. Please DO NOT use if you have an allergy to CHG or antibacterial soaps.  If your skin becomes reddened/irritated stop using the CHG and inform your nurse when you arrive at Short  Stay. Do not shave (including legs and underarms) for at least 48 hours prior to the first CHG shower.  You may shave your face/neck. Please follow these instructions carefully:  1.  Shower with CHG Soap the night before surgery and the  morning of Surgery.  2.  If you choose to wash your hair, wash your hair first as usual with your  normal  shampoo.  3.  After you shampoo, rinse your hair and body thoroughly to remove the  shampoo.                           4.  Use CHG as you would any other liquid soap.  You can apply chg directly  to the skin and wash                       Gently with a scrungie or clean washcloth.  5.  Apply the CHG Soap to your body ONLY FROM THE NECK DOWN.   Do not use on face/ open                           Wound or open sores. Avoid contact with eyes,  ears mouth and genitals (private parts).                       Wash face,  Genitals (private parts) with your normal soap.             6.  Wash thoroughly, paying special attention to the area where your surgery  will be performed.  7.  Thoroughly rinse your body with warm water from the neck down.  8.  DO NOT shower/wash with your normal soap after using and rinsing off  the CHG Soap.                9.  Pat yourself dry with a clean towel.            10.  Wear clean pajamas.            11.  Place clean sheets on your bed the night of your first shower and do not  sleep with pets. Day of Surgery : Do not apply any lotions/deodorants the morning of surgery.  Please wear clean clothes to the hospital/surgery center.  FAILURE TO FOLLOW THESE INSTRUCTIONS MAY RESULT IN THE CANCELLATION OF YOUR SURGERY PATIENT SIGNATURE_________________________________  NURSE SIGNATURE__________________________________  ________________________________________________________________________   Adam Phenix  An incentive spirometer is a tool that can help keep your lungs clear and active. This tool measures how well you are  filling your lungs with each breath. Taking long deep breaths may help reverse or decrease the chance of developing breathing (pulmonary) problems (especially infection) following:  A long period of time when you are unable to move or be active. BEFORE THE PROCEDURE   If the spirometer includes an indicator to show your best effort, your nurse or respiratory therapist will set it to a desired goal.  If possible, sit up straight or lean slightly forward. Try not to slouch.  Hold the incentive spirometer in an upright position. INSTRUCTIONS FOR USE  1. Sit on the edge of your bed if possible, or sit up as far as you can in bed or on a chair. 2. Hold the incentive spirometer in an upright position. 3. Breathe out normally. 4. Place the mouthpiece in your mouth and seal your lips tightly around it. 5. Breathe in slowly and as deeply as possible, raising the piston or the ball toward the top of the column. 6. Hold your breath for 3-5 seconds or for as long as possible. Allow the piston or ball to fall to the bottom of the column. 7. Remove the mouthpiece from your mouth and breathe out normally. 8. Rest for a few seconds and repeat Steps 1 through 7 at least 10 times every 1-2 hours when you are awake. Take your time and take a few normal breaths between deep breaths. 9. The spirometer may include an indicator to show your best effort. Use the indicator as a goal to work toward during each repetition. 10. After each set of 10 deep breaths, practice coughing to be sure your lungs are clear. If you have an incision (the cut made at the time of surgery), support your incision when coughing by placing a pillow or rolled up towels firmly against it. Once you are able to get out of bed, walk around indoors and cough well. You may stop using the incentive spirometer when instructed by your caregiver.  RISKS AND COMPLICATIONS  Take your time so you do not get dizzy or light-headed.  If you are in  pain,  you may need to take or ask for pain medication before doing incentive spirometry. It is harder to take a deep breath if you are having pain. AFTER USE  Rest and breathe slowly and easily.  It can be helpful to keep track of a log of your progress. Your caregiver can provide you with a simple table to help with this. If you are using the spirometer at home, follow these instructions: [place] IF:   You are having difficultly using the spirometer.  You have trouble using the spirometer as often as instructed.  Your pain medication is not giving enough relief while using the spirometer.  You develop fever of 100.5 F (38.1 C) or higher. SEEK IMMEDIATE MEDICAL CARE IF:   You cough up bloody sputum that had not been present before.  You develop fever of 102 F (38.9 C) or greater.  You develop worsening pain at or near the incision site. MAKE SURE YOU:   Understand these instructions.  Will watch your condition.  Will get help right away if you are not doing well or get worse. Document Released: 05/13/2006 Document Revised: 03/25/2011 Document Reviewed: 07/14/2006 [facility] Patient Information 2014 [place], [place].   ________________________________________________________________________

## 2018-07-23 NOTE — Progress Notes (Addendum)
EKG 03-13-18 Epic ECHO 01-11-18 CARE EVERYWHERE CHEST XRAY 03-13-18 EPIC

## 2018-07-24 DIAGNOSIS — H348322 Tributary (branch) retinal vein occlusion, left eye, stable: Secondary | ICD-10-CM | POA: Diagnosis not present

## 2018-07-24 DIAGNOSIS — H35372 Puckering of macula, left eye: Secondary | ICD-10-CM | POA: Diagnosis not present

## 2018-07-24 DIAGNOSIS — E113293 Type 2 diabetes mellitus with mild nonproliferative diabetic retinopathy without macular edema, bilateral: Secondary | ICD-10-CM | POA: Diagnosis not present

## 2018-07-24 DIAGNOSIS — H34811 Central retinal vein occlusion, right eye, with macular edema: Secondary | ICD-10-CM | POA: Diagnosis not present

## 2018-07-27 ENCOUNTER — Encounter (HOSPITAL_COMMUNITY): Payer: Self-pay

## 2018-07-27 ENCOUNTER — Other Ambulatory Visit (HOSPITAL_COMMUNITY)
Admission: RE | Admit: 2018-07-27 | Discharge: 2018-07-27 | Disposition: A | Discharge status: Home / Self Care | Payer: Medicare Other | Source: Ambulatory Visit | Attending: General Surgery | Admitting: General Surgery

## 2018-07-27 ENCOUNTER — Encounter (HOSPITAL_COMMUNITY)
Admission: RE | Admit: 2018-07-27 | Discharge: 2018-07-27 | Disposition: A | Discharge status: Home / Self Care | Payer: Medicare Other | Source: Ambulatory Visit | Attending: General Surgery | Admitting: General Surgery

## 2018-07-27 ENCOUNTER — Other Ambulatory Visit: Payer: Self-pay

## 2018-07-27 DIAGNOSIS — Z1159 Encounter for screening for other viral diseases: Secondary | ICD-10-CM | POA: Insufficient documentation

## 2018-07-27 DIAGNOSIS — K8020 Calculus of gallbladder without cholecystitis without obstruction: Secondary | ICD-10-CM | POA: Insufficient documentation

## 2018-07-27 DIAGNOSIS — Z809 Family history of malignant neoplasm, unspecified: Secondary | ICD-10-CM | POA: Diagnosis not present

## 2018-07-27 DIAGNOSIS — Z01812 Encounter for preprocedural laboratory examination: Secondary | ICD-10-CM | POA: Insufficient documentation

## 2018-07-27 DIAGNOSIS — Z79899 Other long term (current) drug therapy: Secondary | ICD-10-CM | POA: Insufficient documentation

## 2018-07-27 DIAGNOSIS — E119 Type 2 diabetes mellitus without complications: Secondary | ICD-10-CM | POA: Insufficient documentation

## 2018-07-27 DIAGNOSIS — Z7984 Long term (current) use of oral hypoglycemic drugs: Secondary | ICD-10-CM | POA: Insufficient documentation

## 2018-07-27 DIAGNOSIS — K828 Other specified diseases of gallbladder: Secondary | ICD-10-CM | POA: Diagnosis not present

## 2018-07-27 DIAGNOSIS — Z7901 Long term (current) use of anticoagulants: Secondary | ICD-10-CM | POA: Insufficient documentation

## 2018-07-27 DIAGNOSIS — Z8673 Personal history of transient ischemic attack (TIA), and cerebral infarction without residual deficits: Secondary | ICD-10-CM | POA: Insufficient documentation

## 2018-07-27 DIAGNOSIS — Z8249 Family history of ischemic heart disease and other diseases of the circulatory system: Secondary | ICD-10-CM | POA: Diagnosis not present

## 2018-07-27 DIAGNOSIS — Z87891 Personal history of nicotine dependence: Secondary | ICD-10-CM | POA: Diagnosis not present

## 2018-07-27 DIAGNOSIS — I340 Nonrheumatic mitral (valve) insufficiency: Secondary | ICD-10-CM | POA: Insufficient documentation

## 2018-07-27 DIAGNOSIS — Z803 Family history of malignant neoplasm of breast: Secondary | ICD-10-CM | POA: Diagnosis not present

## 2018-07-27 DIAGNOSIS — E785 Hyperlipidemia, unspecified: Secondary | ICD-10-CM | POA: Insufficient documentation

## 2018-07-27 DIAGNOSIS — I480 Paroxysmal atrial fibrillation: Secondary | ICD-10-CM | POA: Insufficient documentation

## 2018-07-27 DIAGNOSIS — Z85828 Personal history of other malignant neoplasm of skin: Secondary | ICD-10-CM | POA: Diagnosis not present

## 2018-07-27 DIAGNOSIS — K8010 Calculus of gallbladder with chronic cholecystitis without obstruction: Secondary | ICD-10-CM | POA: Diagnosis not present

## 2018-07-27 DIAGNOSIS — K219 Gastro-esophageal reflux disease without esophagitis: Secondary | ICD-10-CM | POA: Insufficient documentation

## 2018-07-27 DIAGNOSIS — I10 Essential (primary) hypertension: Secondary | ICD-10-CM | POA: Insufficient documentation

## 2018-07-27 DIAGNOSIS — J449 Chronic obstructive pulmonary disease, unspecified: Secondary | ICD-10-CM | POA: Insufficient documentation

## 2018-07-27 DIAGNOSIS — Z825 Family history of asthma and other chronic lower respiratory diseases: Secondary | ICD-10-CM | POA: Diagnosis not present

## 2018-07-27 HISTORY — DX: Paroxysmal atrial fibrillation: I48.0

## 2018-07-27 HISTORY — DX: Chronic obstructive pulmonary disease, unspecified: J44.9

## 2018-07-27 HISTORY — DX: Nonrheumatic mitral (valve) insufficiency: I34.0

## 2018-07-27 HISTORY — DX: Cerebral infarction, unspecified: I63.9

## 2018-07-27 LAB — COMPREHENSIVE METABOLIC PANEL
ALT: 18 U/L (ref 0–44)
AST: 23 U/L (ref 15–41)
Albumin: 4.7 g/dL (ref 3.5–5.0)
Alkaline Phosphatase: 62 U/L (ref 38–126)
Anion gap: 12 (ref 5–15)
BUN: 29 mg/dL — ABNORMAL HIGH (ref 8–23)
CO2: 23 mmol/L (ref 22–32)
Calcium: 9.9 mg/dL (ref 8.9–10.3)
Chloride: 105 mmol/L (ref 98–111)
Creatinine, Ser: 1.17 mg/dL (ref 0.61–1.24)
GFR calc Af Amer: >60 mL/min (ref >60)
GFR calc non Af Amer: 59 mL/min — ABNORMAL LOW (ref >60)
Glucose, Bld: 108 mg/dL — ABNORMAL HIGH (ref 70–99)
Potassium: 4.4 mmol/L (ref 3.5–5.1)
Sodium: 140 mmol/L (ref 135–145)
Total Bilirubin: 0.6 mg/dL (ref 0.3–1.2)
Total Protein: 7.5 g/dL (ref 6.5–8.1)

## 2018-07-27 LAB — HEMOGLOBIN A1C
Hgb A1c MFr Bld: 5.4 % (ref 4.8–5.6)
Mean Plasma Glucose: 108.28 mg/dL

## 2018-07-27 LAB — CBC WITH DIFFERENTIAL/PLATELET
Abs Immature Granulocytes: 0.05 10*3/uL (ref 0.00–0.07)
Basophils Absolute: 0.1 10*3/uL (ref 0.0–0.1)
Basophils Relative: 1 %
Eosinophils Absolute: 0.1 10*3/uL (ref 0.0–0.5)
Eosinophils Relative: 1 %
HCT: 41.4 % (ref 39.0–52.0)
Hemoglobin: 13.9 g/dL (ref 13.0–17.0)
Immature Granulocytes: 1 %
Lymphocytes Relative: 20 %
Lymphs Abs: 1.7 10*3/uL (ref 0.7–4.0)
MCH: 30 pg (ref 26.0–34.0)
MCHC: 33.6 g/dL (ref 30.0–36.0)
MCV: 89.2 fL (ref 80.0–100.0)
Monocytes Absolute: 0.7 10*3/uL (ref 0.1–1.0)
Monocytes Relative: 8 %
Neutro Abs: 5.9 10*3/uL (ref 1.7–7.7)
Neutrophils Relative %: 69 %
Platelets: 238 10*3/uL (ref 150–400)
RBC: 4.64 MIL/uL (ref 4.22–5.81)
RDW: 14.4 % (ref 11.5–15.5)
WBC: 8.5 10*3/uL (ref 4.0–10.5)
nRBC: 0 % (ref 0.0–0.2)

## 2018-07-27 LAB — GLUCOSE, CAPILLARY: Glucose-Capillary: 103 mg/dL — ABNORMAL HIGH (ref 70–99)

## 2018-07-27 LAB — ABO/RH: ABO/RH(D): AB POS

## 2018-07-27 LAB — SARS CORONAVIRUS 2 (TAT 6-24 HRS): SARS Coronavirus 2: NEGATIVE

## 2018-07-27 NOTE — Progress Notes (Addendum)
  Cardiac Clearance from Dr. Kathlee Nations on chart  12-31-17 [facility] w/ Cardiology (Care Everywhere)

## 2018-07-28 ENCOUNTER — Encounter (HOSPITAL_COMMUNITY): Payer: Self-pay

## 2018-07-28 NOTE — Progress Notes (Signed)
Anesthesia Chart Review:   Case: [id number] Date/Time: 07/30/18 1245   Procedure: LAPAROSCOPIC CHOLECYSTECTOMY WITH INTRAOPERATIVE CHOLANGIOGRAM (N/A )   Anesthesia type: General   Pre-op diagnosis: SYMPTOMATIC CHOLELITHIASIS   Location: [place] / [facility] ORS   Surgeon: Greer Pickerel, MD      DISCUSSION:  Pt is a 78 year old male with hx PAF, mitral regurgitation, hx endocarditis, HTN, DM, COPD, stroke   - Pt takes xarelto for PAF; will hold xarelto for 2 days before surgery. Will get PT/INR day of surgery.    VS: BP (!) 144/73   Pulse 81   Temp 36.6 C (Oral)   Resp 18   Ht 5\' 11"  (1.803 m)   Wt 77.7 kg   SpO2 99%   BMI 23.89 kg/m    PROVIDERS: - PCP is Elby Beck, FNP - Cardiologist is Lady Saucier, MD who cleared pt for surgery at last office visit 07/13/18 (notees in care everywhere).    LABS: Will get PT/INR day of surgery.    Labs reviewed: Acceptable for surgery.  (all labs ordered are listed, but only abnormal results are displayed)  Labs Reviewed  COMPREHENSIVE METABOLIC PANEL - Abnormal; Notable for the following components:      Result Value   Glucose, Bld 108 (*)    BUN 29 (*)    GFR calc non Af Amer 59 (*)    All other components within normal limits  GLUCOSE, CAPILLARY - Abnormal; Notable for the following components:   Glucose-Capillary 103 (*)    All other components within normal limits  CBC WITH DIFFERENTIAL/PLATELET  HEMOGLOBIN A1C  TYPE AND SCREEN  ABO/RH     IMAGES:  CXR 03/13/18: No active cardiopulmonary disease.   EKG 03/13/18: Sinus bradycardia with sinus arrhythmia. Right superior axis deviation. Nonspecific ST abnormality   CV:  Echo 01/02/18 (care everywhere): - LV EF 60-65% - Septal E/E' ratio is 14.4 indicating normal filling pressure.  - Mild prolapse of the anterior mitral valve leaflet. Mild mitral regurgitation. - Trace to mild tricuspid regurgitation.   Past Medical History:  Diagnosis Date  . Cancer  ([place]) 2017   skin   . COPD (chronic obstructive pulmonary disease) ([place])   . Diabetes mellitus without complication ([place])   . GERD (gastroesophageal reflux disease)   . Hyperlipidemia   . Hypertension   . Mitral regurgitation   . PAF (paroxysmal atrial fibrillation) ([place])   . Stroke [facility])     Past Surgical History:  Procedure Laterality Date  . COLONOSCOPY WITH PROPOFOL N/A 11/17/2017   Procedure: COLONOSCOPY WITH PROPOFOL;  Surgeon: Jonathon Bellows, MD;  Location: [facility] ENDOSCOPY;  Service: Gastroenterology;  Laterality: N/A;  . ESOPHAGOGASTRODUODENOSCOPY (EGD) WITH PROPOFOL N/A 11/17/2017   Procedure: ESOPHAGOGASTRODUODENOSCOPY (EGD) WITH PROPOFOL;  Surgeon: Jonathon Bellows, MD;  Location: [facility] ENDOSCOPY;  Service: Gastroenterology;  Laterality: N/A;  . GIVENS CAPSULE STUDY N/A 12/09/2017   Procedure: GIVENS CAPSULE STUDY;  Surgeon: Jonathon Bellows, MD;  Location: [facility] ENDOSCOPY;  Service: Gastroenterology;  Laterality: N/A;  . TONSILLECTOMY      MEDICATIONS: . atorvastatin (LIPITOR) 10 MG tablet  . benazepril (LOTENSIN) 40 MG tablet  . Cholecalciferol 4000 units CAPS  . diltiazem (CARDIZEM CD) 180 MG 24 hr capsule  . fluticasone (FLONASE) 50 MCG/ACT nasal spray  . hydrochlorothiazide (HYDRODIURIL) 12.5 MG tablet  . metFORMIN (GLUCOPHAGE) 500 MG tablet  . omeprazole (PRILOSEC) 40 MG capsule  . rivaroxaban (XARELTO) 20 MG TABS tablet   No current facility-administered medications for  this encounter.    - Pt to hold xarelto 2 days before surgery.   If labs acceptable day of surgery, I anticipate pt can proceed with surgery as scheduled.  Willeen Cass, FNP-BC [facility] Short Stay Surgical Center/Anesthesiology Phone: [phone] 07/28/2018 11:58 AM

## 2018-07-28 NOTE — Anesthesia Preprocedure Evaluation (Addendum)
Anesthesia Evaluation  Patient identified by MRN, date of birth, ID band Patient awake    Reviewed: Allergy & Precautions, NPO status , Patient's Chart, lab work & pertinent test results  History of Anesthesia Complications Negative for: history of anesthetic complications  Airway Mallampati: II  TM Distance: >3 FB Neck ROM: Full    Dental  (+) Poor Dentition, Missing   Pulmonary COPD, former smoker,    Pulmonary exam normal        Cardiovascular hypertension, Normal cardiovascular exam+ dysrhythmias Atrial Fibrillation + Valvular Problems/Murmurs MR      Neuro/Psych CVA negative psych ROS   GI/Hepatic Neg liver ROS, GERD  ,  Endo/Other  diabetes  Renal/GU negative Renal ROS  negative genitourinary   Musculoskeletal negative musculoskeletal ROS (+)   Abdominal   Peds  Hematology negative hematology ROS (+)   Anesthesia Other Findings Echo 01/02/18 (care everywhere): - LV EF 60-65% - Septal E/E' ratio is 14.4 indicating normal filling pressure.  - Mild prolapse of the anterior mitral valve leaflet. Mild mitral regurgitation. - Trace to mild tricuspid regurgitation.  Reproductive/Obstetrics                          Anesthesia Physical Anesthesia Plan  ASA: III  Anesthesia Plan: General   Post-op Pain Management:    Induction: Intravenous  PONV Risk Score and Plan: 3 and Ondansetron, Dexamethasone and Treatment may vary due to age or medical condition  Airway Management Planned: Oral ETT  Additional Equipment: None  Intra-op Plan:   Post-operative Plan: Extubation in OR  Informed Consent: I have reviewed the patients History and Physical, chart, labs and discussed the procedure including the risks, benefits and alternatives for the proposed anesthesia with the patient or authorized representative who has indicated his/her understanding and acceptance.     Dental advisory  given  Plan Discussed with:   Anesthesia Plan Comments: (See APP note by Willeen Cass, FNP)      Anesthesia Quick Evaluation

## 2018-07-30 ENCOUNTER — Other Ambulatory Visit: Payer: Self-pay

## 2018-07-30 ENCOUNTER — Ambulatory Visit (HOSPITAL_COMMUNITY): Payer: Medicare Other | Admitting: Emergency Medicine

## 2018-07-30 ENCOUNTER — Encounter (HOSPITAL_COMMUNITY)
Admission: RE | Disposition: A | Discharge status: Home / Self Care | Payer: Self-pay | Source: Home / Self Care | Attending: General Surgery

## 2018-07-30 ENCOUNTER — Ambulatory Visit (HOSPITAL_COMMUNITY)
Admission: RE | Admit: 2018-07-30 | Discharge: 2018-07-30 | Disposition: A | Discharge status: Home / Self Care | Payer: Medicare Other | Attending: General Surgery | Admitting: General Surgery

## 2018-07-30 ENCOUNTER — Encounter (HOSPITAL_COMMUNITY): Payer: Self-pay | Admitting: Emergency Medicine

## 2018-07-30 ENCOUNTER — Ambulatory Visit (HOSPITAL_COMMUNITY): Payer: Medicare Other

## 2018-07-30 DIAGNOSIS — Z809 Family history of malignant neoplasm, unspecified: Secondary | ICD-10-CM | POA: Insufficient documentation

## 2018-07-30 DIAGNOSIS — E785 Hyperlipidemia, unspecified: Secondary | ICD-10-CM | POA: Insufficient documentation

## 2018-07-30 DIAGNOSIS — I4891 Unspecified atrial fibrillation: Secondary | ICD-10-CM | POA: Diagnosis not present

## 2018-07-30 DIAGNOSIS — K219 Gastro-esophageal reflux disease without esophagitis: Secondary | ICD-10-CM | POA: Insufficient documentation

## 2018-07-30 DIAGNOSIS — Z85828 Personal history of other malignant neoplasm of skin: Secondary | ICD-10-CM | POA: Insufficient documentation

## 2018-07-30 DIAGNOSIS — Z1159 Encounter for screening for other viral diseases: Secondary | ICD-10-CM | POA: Insufficient documentation

## 2018-07-30 DIAGNOSIS — K8010 Calculus of gallbladder with chronic cholecystitis without obstruction: Secondary | ICD-10-CM | POA: Diagnosis not present

## 2018-07-30 DIAGNOSIS — Z803 Family history of malignant neoplasm of breast: Secondary | ICD-10-CM | POA: Insufficient documentation

## 2018-07-30 DIAGNOSIS — E119 Type 2 diabetes mellitus without complications: Secondary | ICD-10-CM | POA: Diagnosis not present

## 2018-07-30 DIAGNOSIS — Z9049 Acquired absence of other specified parts of digestive tract: Secondary | ICD-10-CM | POA: Diagnosis not present

## 2018-07-30 DIAGNOSIS — I340 Nonrheumatic mitral (valve) insufficiency: Secondary | ICD-10-CM | POA: Diagnosis not present

## 2018-07-30 DIAGNOSIS — Z8249 Family history of ischemic heart disease and other diseases of the circulatory system: Secondary | ICD-10-CM | POA: Insufficient documentation

## 2018-07-30 DIAGNOSIS — Z7901 Long term (current) use of anticoagulants: Secondary | ICD-10-CM | POA: Diagnosis not present

## 2018-07-30 DIAGNOSIS — Z8673 Personal history of transient ischemic attack (TIA), and cerebral infarction without residual deficits: Secondary | ICD-10-CM | POA: Diagnosis not present

## 2018-07-30 DIAGNOSIS — Z87891 Personal history of nicotine dependence: Secondary | ICD-10-CM | POA: Insufficient documentation

## 2018-07-30 DIAGNOSIS — J449 Chronic obstructive pulmonary disease, unspecified: Secondary | ICD-10-CM | POA: Diagnosis not present

## 2018-07-30 DIAGNOSIS — K828 Other specified diseases of gallbladder: Secondary | ICD-10-CM | POA: Insufficient documentation

## 2018-07-30 DIAGNOSIS — J189 Pneumonia, unspecified organism: Secondary | ICD-10-CM | POA: Diagnosis not present

## 2018-07-30 DIAGNOSIS — Z79899 Other long term (current) drug therapy: Secondary | ICD-10-CM | POA: Insufficient documentation

## 2018-07-30 DIAGNOSIS — Z7984 Long term (current) use of oral hypoglycemic drugs: Secondary | ICD-10-CM | POA: Insufficient documentation

## 2018-07-30 DIAGNOSIS — Z419 Encounter for procedure for purposes other than remedying health state, unspecified: Secondary | ICD-10-CM

## 2018-07-30 DIAGNOSIS — I480 Paroxysmal atrial fibrillation: Secondary | ICD-10-CM | POA: Insufficient documentation

## 2018-07-30 DIAGNOSIS — I10 Essential (primary) hypertension: Secondary | ICD-10-CM | POA: Insufficient documentation

## 2018-07-30 DIAGNOSIS — Z825 Family history of asthma and other chronic lower respiratory diseases: Secondary | ICD-10-CM | POA: Insufficient documentation

## 2018-07-30 HISTORY — PX: CHOLECYSTECTOMY: SHX55

## 2018-07-30 LAB — GLUCOSE, CAPILLARY
Glucose-Capillary: 121 mg/dL — ABNORMAL HIGH (ref 70–99)
Glucose-Capillary: 160 mg/dL — ABNORMAL HIGH (ref 70–99)

## 2018-07-30 LAB — TYPE AND SCREEN
ABO/RH(D): AB POS
Antibody Screen: NEGATIVE

## 2018-07-30 LAB — PROTIME-INR
INR: 1.1 (ref 0.8–1.2)
Prothrombin Time: 13.6 seconds (ref 11.4–15.2)

## 2018-07-30 SURGERY — LAPAROSCOPIC CHOLECYSTECTOMY WITH INTRAOPERATIVE CHOLANGIOGRAM
Anesthesia: General

## 2018-07-30 MED ORDER — FENTANYL CITRATE (PF) 100 MCG/2ML IJ SOLN
INTRAMUSCULAR | Status: AC
  Filled 2018-07-30: qty 2

## 2018-07-30 MED ORDER — OXYCODONE HCL 5 MG PO TABS: 5.0000 mg | ORAL_TABLET | Freq: Once | ORAL | Status: DC | PRN

## 2018-07-30 MED ORDER — LIDOCAINE 2% (20 MG/ML) 5 ML SYRINGE
INTRAMUSCULAR | Status: DC | PRN
  Administered 2018-07-30: 60 mg via INTRAVENOUS

## 2018-07-30 MED ORDER — SODIUM CHLORIDE 0.9 % IV SOLN
INTRAVENOUS | Status: DC | PRN
  Administered 2018-07-30: 15:00:00 50 mL

## 2018-07-30 MED ORDER — CHLORHEXIDINE GLUCONATE CLOTH 2 % EX PADS: 6.0000 | MEDICATED_PAD | Freq: Once | CUTANEOUS | Status: DC

## 2018-07-30 MED ORDER — SODIUM CHLORIDE 0.9 % IV SOLN
2.0000 g | INTRAVENOUS | Status: AC
  Administered 2018-07-30: 2 g via INTRAVENOUS
  Filled 2018-07-30: qty 2

## 2018-07-30 MED ORDER — ONDANSETRON HCL 4 MG/2ML IJ SOLN
INTRAMUSCULAR | Status: DC | PRN
  Administered 2018-07-30: 4 mg via INTRAVENOUS

## 2018-07-30 MED ORDER — PROPOFOL 10 MG/ML IV BOLUS
INTRAVENOUS | Status: DC | PRN
  Administered 2018-07-30: 150 mg via INTRAVENOUS

## 2018-07-30 MED ORDER — ROCURONIUM BROMIDE 10 MG/ML (PF) SYRINGE
PREFILLED_SYRINGE | INTRAVENOUS | Status: AC
  Filled 2018-07-30: qty 10

## 2018-07-30 MED ORDER — GABAPENTIN 100 MG PO CAPS
200.0000 mg | ORAL_CAPSULE | ORAL | Status: AC
  Administered 2018-07-30: 200 mg via ORAL
  Filled 2018-07-30: qty 2

## 2018-07-30 MED ORDER — FENTANYL CITRATE (PF) 250 MCG/5ML IJ SOLN
INTRAMUSCULAR | Status: AC
  Filled 2018-07-30: qty 5

## 2018-07-30 MED ORDER — BUPIVACAINE-EPINEPHRINE (PF) 0.25% -1:200000 IJ SOLN
INTRAMUSCULAR | Status: AC
  Filled 2018-07-30: qty 30

## 2018-07-30 MED ORDER — ROCURONIUM BROMIDE 50 MG/5ML IV SOSY
PREFILLED_SYRINGE | INTRAVENOUS | Status: DC | PRN
  Administered 2018-07-30: 50 mg via INTRAVENOUS
  Administered 2018-07-30: 10 mg via INTRAVENOUS

## 2018-07-30 MED ORDER — SUGAMMADEX SODIUM 200 MG/2ML IV SOLN
INTRAVENOUS | Status: DC | PRN
  Administered 2018-07-30: 200 mg via INTRAVENOUS

## 2018-07-30 MED ORDER — ONDANSETRON HCL 4 MG/2ML IJ SOLN
INTRAMUSCULAR | Status: AC
  Filled 2018-07-30: qty 2

## 2018-07-30 MED ORDER — DEXAMETHASONE SODIUM PHOSPHATE 10 MG/ML IJ SOLN
INTRAMUSCULAR | Status: AC
  Filled 2018-07-30: qty 1

## 2018-07-30 MED ORDER — LIDOCAINE 2% (20 MG/ML) 5 ML SYRINGE
INTRAMUSCULAR | Status: AC
  Filled 2018-07-30: qty 5

## 2018-07-30 MED ORDER — LACTATED RINGERS IV SOLN
INTRAVENOUS | Status: AC | PRN
  Administered 2018-07-30: 1000 mL

## 2018-07-30 MED ORDER — KETOROLAC TROMETHAMINE 30 MG/ML IJ SOLN
INTRAMUSCULAR | Status: AC
  Filled 2018-07-30: qty 1

## 2018-07-30 MED ORDER — ACETAMINOPHEN 500 MG PO TABS
1000.0000 mg | ORAL_TABLET | ORAL | Status: AC
  Administered 2018-07-30: 1000 mg via ORAL
  Filled 2018-07-30: qty 2

## 2018-07-30 MED ORDER — FENTANYL CITRATE (PF) 100 MCG/2ML IJ SOLN
INTRAMUSCULAR | Status: DC | PRN
  Administered 2018-07-30 (×5): 50 ug via INTRAVENOUS

## 2018-07-30 MED ORDER — SUCCINYLCHOLINE CHLORIDE 200 MG/10ML IV SOSY
PREFILLED_SYRINGE | INTRAVENOUS | Status: AC
  Filled 2018-07-30: qty 10

## 2018-07-30 MED ORDER — EPHEDRINE SULFATE-NACL 50-0.9 MG/10ML-% IV SOSY
PREFILLED_SYRINGE | INTRAVENOUS | Status: DC | PRN
  Administered 2018-07-30: 10 mg via INTRAVENOUS

## 2018-07-30 MED ORDER — FENTANYL CITRATE (PF) 100 MCG/2ML IJ SOLN
25.0000 ug | INTRAMUSCULAR | Status: DC | PRN
  Administered 2018-07-30: 25 ug via INTRAVENOUS

## 2018-07-30 MED ORDER — OXYCODONE HCL 5 MG/5ML PO SOLN: 5.0000 mg | Freq: Once | ORAL | Status: DC | PRN

## 2018-07-30 MED ORDER — DEXAMETHASONE SODIUM PHOSPHATE 10 MG/ML IJ SOLN
INTRAMUSCULAR | Status: DC | PRN
  Administered 2018-07-30: 4 mg via INTRAVENOUS

## 2018-07-30 MED ORDER — TRAMADOL HCL 50 MG PO TABS: 50.0000 mg | ORAL_TABLET | Freq: Four times a day (QID) | ORAL | 0 refills | Status: DC | PRN

## 2018-07-30 MED ORDER — KETOROLAC TROMETHAMINE 30 MG/ML IJ SOLN
INTRAMUSCULAR | Status: DC | PRN
  Administered 2018-07-30: 15 mg via INTRAVENOUS

## 2018-07-30 MED ORDER — LACTATED RINGERS IV SOLN
INTRAVENOUS | Status: DC
  Administered 2018-07-30 (×2): via INTRAVENOUS

## 2018-07-30 MED ORDER — ONDANSETRON HCL 4 MG/2ML IJ SOLN: 4.0000 mg | Freq: Once | INTRAMUSCULAR | Status: DC | PRN

## 2018-07-30 MED ORDER — BUPIVACAINE-EPINEPHRINE 0.25% -1:200000 IJ SOLN
INTRAMUSCULAR | Status: DC | PRN
  Administered 2018-07-30: 10 mL

## 2018-07-30 SURGICAL SUPPLY — 52 items
APPLICATOR ARISTA FLEXITIP XL (MISCELLANEOUS) IMPLANT
APPLIER CLIP 5 13 M/L LIGAMAX5 (MISCELLANEOUS) ×3
APPLIER CLIP ROT 10 11.4 M/L (STAPLE)
BENZOIN TINCTURE PRP APPL 2/3 (GAUZE/BANDAGES/DRESSINGS) IMPLANT
BNDG ADH 1X3 SHEER STRL LF (GAUZE/BANDAGES/DRESSINGS) ×12 IMPLANT
CABLE HIGH FREQUENCY MONO STRZ (ELECTRODE) ×3 IMPLANT
CHLORAPREP W/TINT 26 (MISCELLANEOUS) ×3 IMPLANT
CLIP APPLIE 5 13 M/L LIGAMAX5 (MISCELLANEOUS) ×1 IMPLANT
CLIP APPLIE ROT 10 11.4 M/L (STAPLE) IMPLANT
CLIP VESOLOCK MED LG 6/CT (CLIP) IMPLANT
CLOSURE WOUND 1/2 X4 (GAUZE/BANDAGES/DRESSINGS)
COVER MAYO STAND STRL (DRAPES) ×3 IMPLANT
COVER SURGICAL LIGHT HANDLE (MISCELLANEOUS) ×3 IMPLANT
COVER WAND RF STERILE (DRAPES) ×3 IMPLANT
DECANTER SPIKE VIAL GLASS SM (MISCELLANEOUS) ×3 IMPLANT
DERMABOND ADVANCED (GAUZE/BANDAGES/DRESSINGS) ×2
DERMABOND ADVANCED .7 DNX12 (GAUZE/BANDAGES/DRESSINGS) ×1 IMPLANT
DRAPE C-ARM 42X120 X-RAY (DRAPES) ×3 IMPLANT
DRSG TEGADERM 2-3/8X2-3/4 SM (GAUZE/BANDAGES/DRESSINGS) IMPLANT
ELECT PENCIL ROCKER SW 15FT (MISCELLANEOUS) IMPLANT
ELECT REM PT RETURN 15FT ADLT (MISCELLANEOUS) ×3 IMPLANT
GAUZE SPONGE 2X2 8PLY STRL LF (GAUZE/BANDAGES/DRESSINGS) IMPLANT
GLOVE BIO SURGEON STRL SZ7.5 (GLOVE) ×3 IMPLANT
GLOVE INDICATOR 8.0 STRL GRN (GLOVE) ×3 IMPLANT
GOWN STRL REUS W/TWL XL LVL3 (GOWN DISPOSABLE) ×9 IMPLANT
GRASPER SUT TROCAR 14GX15 (MISCELLANEOUS) ×3 IMPLANT
HEMOSTAT ARISTA ABSORB 3G PWDR (HEMOSTASIS) IMPLANT
HEMOSTAT SNOW SURGICEL 2X4 (HEMOSTASIS) ×3 IMPLANT
KIT BASIN OR (CUSTOM PROCEDURE TRAY) ×3 IMPLANT
KIT TURNOVER KIT A (KITS) ×3 IMPLANT
L-HOOK LAP DISP 36CM (ELECTROSURGICAL)
LHOOK LAP DISP 36CM (ELECTROSURGICAL) IMPLANT
PENCIL SMOKE EVACUATOR (MISCELLANEOUS) IMPLANT
POUCH RETRIEVAL ECOSAC 10 (ENDOMECHANICALS) ×1 IMPLANT
POUCH RETRIEVAL ECOSAC 10MM (ENDOMECHANICALS) ×2
SCISSORS LAP 5X35 DISP (ENDOMECHANICALS) ×3 IMPLANT
SET CHOLANGIOGRAPH MIX (MISCELLANEOUS) ×3 IMPLANT
SET IRRIG TUBING LAPAROSCOPIC (IRRIGATION / IRRIGATOR) ×3 IMPLANT
SET TUBE SMOKE EVAC HIGH FLOW (TUBING) ×3 IMPLANT
SLEEVE XCEL OPT CAN 5 100 (ENDOMECHANICALS) ×6 IMPLANT
SPONGE GAUZE 2X2 STER 10/PKG (GAUZE/BANDAGES/DRESSINGS)
STRIP CLOSURE SKIN 1/2X4 (GAUZE/BANDAGES/DRESSINGS) IMPLANT
SUT MNCRL AB 4-0 PS2 18 (SUTURE) ×3 IMPLANT
SUT VIC AB 0 UR5 27 (SUTURE) ×6 IMPLANT
SUT VICRYL 0 TIES 12 18 (SUTURE) IMPLANT
SUT VICRYL 0 UR6 27IN ABS (SUTURE) IMPLANT
TOWEL OR 17X26 10 PK STRL BLUE (TOWEL DISPOSABLE) ×3 IMPLANT
TOWEL OR NON WOVEN STRL DISP B (DISPOSABLE) ×3 IMPLANT
TRAY LAPAROSCOPIC (CUSTOM PROCEDURE TRAY) ×3 IMPLANT
TROCAR BLADELESS OPT 5 100 (ENDOMECHANICALS) ×9 IMPLANT
TROCAR XCEL BLUNT TIP 100MML (ENDOMECHANICALS) IMPLANT
TROCAR XCEL NON-BLD 11X100MML (ENDOMECHANICALS) IMPLANT

## 2018-07-30 NOTE — Discharge Instructions (Signed)
RESUME BLOOD THINNER ([facility]AR[facility]) ON Saturday July 18  [facility] LAPAROSCOPIC [facility]: POST OP INSTRUCTIONS Always review your discharge instruction sheet given to you by the facility where your surgery was performed. IF YOU HAVE DISABILITY OR FAMILY LEAVE FORMS, YOU MUST BRING THEM TO THE OFFICE FOR PROCESSING.   DO NOT GIVE THEM TO YOUR DOCTOR.  PAIN CONTROL  1. First take acetaminophen (Tylenol) AND/or ibuprofen (Advil) to control your pain after surgery.  Follow directions on package.  Taking acetaminophen (Tylenol) and/or ibuprofen (Advil) regularly after surgery will help to control your pain and lower the amount of prescription pain medication you may need.  You should not take more than 3,000 mg (3 grams) of acetaminophen (Tylenol) in 24 hours.  You should not take ibuprofen (Advil), aleve, motrin, naprosyn or other NSAIDS if you have a history of stomach ulcers or chronic kidney disease.  2. A prescription for pain medication may be given to you upon discharge.  Take your pain medication as prescribed, if you still have uncontrolled pain after taking acetaminophen (Tylenol) or ibuprofen (Advil). 3. Use ice packs to help control pain. 4. If you need a refill on your pain medication, please contact your pharmacy.  They will contact our office to request authorization. Prescriptions will not be filled after 5pm or on week-ends.  HOME MEDICATIONS 5. Take your usually prescribed medications unless otherwise directed.  DIET 6. You should follow a light diet the first few days after arrival home.  Be sure to include lots of fluids daily. Avoid fatty, fried foods.   CONSTIPATION 7. It is common to experience some constipation after surgery and if you are taking pain medication.  Increasing fluid intake and taking a stool softener (such as Colace) will usually help or prevent this problem from occurring.  A mild laxative (Milk of Magnesia or Miralax) should be taken  according to package instructions if there are no bowel movements after 48 hours.  WOUND/INCISION CARE 8. Most patients will experience some swelling and bruising in the area of the incisions.  Ice packs will help.  Swelling and bruising can take several days to resolve.  9. Unless discharge instructions indicate otherwise, follow guidelines below  a. STERI-STRIPS - you may remove your outer bandages 48 hours after surgery, and you may shower at that time.  You have steri-strips (small skin tapes) in place directly over the incision.  These strips should be left on the skin for 7-10 days.   b. DERMABOND/SKIN GLUE - you may shower in 24 hours.  The glue will flake off over the next 2-3 weeks. 10. Any sutures or staples will be removed at the office during your follow-up visit.  ACTIVITIES 11. You may resume regular (light) daily activities beginning the next day--such as daily self-care, walking, climbing stairs--gradually increasing activities as tolerated.  You may have sexual intercourse when it is comfortable.  Refrain from any heavy lifting or straining until approved by your doctor. a. You may drive when you are no longer taking prescription pain medication, you can comfortably wear a seatbelt, and you can safely maneuver your car and apply brakes.  FOLLOW-UP 12. You should see your doctor in the office for a follow-up appointment approximately 2-3 weeks after your surgery.  You should have been given your post-op/follow-up appointment when your surgery was scheduled.  If you did not receive a post-op/follow-up appointment, make sure that you call for this appointment within a day or two after you arrive  home to insure a convenient appointment time.  OTHER INSTRUCTIONS 13.   WHEN TO CALL YOUR DOCTOR: 1. Fever over 101.0 2. Inability to urinate 3. Continued bleeding from incision. 4. Increased pain, redness, or drainage from the incision. 5. Increasing abdominal pain  The clinic staff  is available to answer your questions during regular business hours.  Please dont hesitate to call and ask to speak to one of the nurses for clinical concerns.  If you have a medical emergency, go to the nearest emergency room or call 911.  A surgeon from [facility] is always on call at the hospital. [address] [place], [place], [place]  [address] ? [address], [place], [place]   [address] [phone] ? [phone] ? FAX [phone] Web site: [email or web address]     Managing Your Pain After Surgery Without Opioids    Thank you for participating in our program to help patients manage their pain after surgery without opioids. This is part of our effort to provide you with the best care possible, without exposing you or your family to the risk that opioids pose.  What pain can I expect after surgery? You can expect to have some pain after surgery. This is normal. The pain is typically worse the day after surgery, and quickly begins to get better. Many studies have found that many patients are able to manage their pain after surgery with Over-the-Counter (OTC) medications such as Tylenol and Motrin. If you have a condition that does not allow you to take Tylenol or Motrin, notify your surgical team.  How will I manage my pain? The best strategy for controlling your pain after surgery is around the clock pain control with Tylenol (acetaminophen) and Motrin (ibuprofen or Advil). Alternating these medications with each other allows you to maximize your pain control. In addition to Tylenol and Motrin, you can use heating pads or ice packs on your incisions to help reduce your pain.  How will I alternate your regular strength over-the-counter pain medication? You will take a dose of pain medication every three hours. ; Start by taking 650 mg of Tylenol (2 pills of 325 mg) ; 3 hours later take 600 mg of Motrin (3 pills of 200 mg) ; 3 hours after  taking the Motrin take 650 mg of Tylenol ; 3 hours after that take 600 mg of Motrin.   - 1 -  See example - if your first dose of Tylenol is at 12:00 PM   12:00 PM Tylenol 650 mg (2 pills of 325 mg)  3:00 PM Motrin 600 mg (3 pills of 200 mg)  6:00 PM Tylenol 650 mg (2 pills of 325 mg)  9:00 PM Motrin 600 mg (3 pills of 200 mg)  Continue alternating every 3 hours   We recommend that you follow this schedule around-the-clock for at least 3 days after surgery, or until you feel that it is no longer needed. Use the table on the last page of this handout to keep track of the medications you are taking. Important: Do not take more than 3000mg  of Tylenol or 2300mg  of Motrin in a 24-hour period. Do not take ibuprofen/Motrin if you have a history of bleeding stomach ulcers, severe kidney disease, &/or actively taking a blood thinner  What if I still have pain? If you have pain that is not controlled with the over-the-counter pain medications (Tylenol and Motrin or Advil) you might have what we call breakthrough pain. You will receive  a prescription for a small amount of an opioid pain medication such as Oxycodone, Tramadol, or Tylenol with Codeine. Use these opioid pills in the first 24 hours after surgery if you have breakthrough pain. Do not take more than 1 pill every 4-6 hours.  If you still have uncontrolled pain after using all opioid pills, don't hesitate to call our staff using the number provided. We will help make sure you are managing your pain in the best way possible, and if necessary, we can provide a prescription for additional pain medication.   Day 1    Time  Name of Medication Number of pills taken  Amount of Acetaminophen  Pain Level   Comments  AM PM       AM PM       AM PM       AM PM       AM PM       AM PM       AM PM       AM PM       Total Daily amount of Acetaminophen Do not take more than  3,000 mg per day      Day 2    Time  Name of Medication  Number of pills taken  Amount of Acetaminophen  Pain Level   Comments  AM PM       AM PM       AM PM       AM PM       AM PM       AM PM       AM PM       AM PM       Total Daily amount of Acetaminophen Do not take more than  3,000 mg per day      Day 3    Time  Name of Medication Number of pills taken  Amount of Acetaminophen  Pain Level   Comments  AM PM       AM PM       AM PM       AM PM          AM PM       AM PM       AM PM       AM PM       Total Daily amount of Acetaminophen Do not take more than  3,000 mg per day      Day 4    Time  Name of Medication Number of pills taken  Amount of Acetaminophen  Pain Level   Comments  AM PM       AM PM       AM PM       AM PM       AM PM       AM PM       AM PM       AM PM       Total Daily amount of Acetaminophen Do not take more than  3,000 mg per day      Day 5    Time  Name of Medication Number of pills taken  Amount of Acetaminophen  Pain Level   Comments  AM PM       AM PM       AM PM       AM PM       AM PM  AM PM       AM PM       AM PM       Total Daily amount of Acetaminophen Do not take more than  3,000 mg per day       Day 6    Time  Name of Medication Number of pills taken  Amount of Acetaminophen  Pain Level  Comments  AM PM       AM PM       AM PM       AM PM       AM PM       AM PM       AM PM       AM PM       Total Daily amount of Acetaminophen Do not take more than  3,000 mg per day      Day 7    Time  Name of Medication Number of pills taken  Amount of Acetaminophen  Pain Level   Comments  AM PM       AM PM       AM PM       AM PM       AM PM       AM PM       AM PM       AM PM       Total Daily amount of Acetaminophen Do not take more than  3,000 mg per day        For additional information about how and where to safely dispose of unused opioid medications - [employer]  Disclaimer: This document  contains information and/or instructional materials adapted from [place] for the typical patient with your condition. It does not replace medical advice from your health care provider because your experience may differ from that of the typical patient. Talk to your health care provider if you have any questions about this document, your condition or your treatment plan. Adapted from [place]

## 2018-07-30 NOTE — Transfer of Care (Signed)
Immediate Anesthesia Transfer of Care Note  Patient: Gabriel Brewer  Procedure(s) Performed: LAPAROSCOPIC CHOLECYSTECTOMY WITH INTRAOPERATIVE CHOLANGIOGRAM (N/A )  Patient Location: PACU  Anesthesia Type:General  Level of Consciousness: awake, alert , oriented and patient cooperative  Airway & Oxygen Therapy: Patient Spontanous Breathing and Patient connected to face mask oxygen  Post-op Assessment: Report given to RN, Post -op Vital signs reviewed and stable and Patient moving all extremities  Post vital signs: Reviewed and stable  Last Vitals:  Vitals Value Taken Time  BP 154/73 07/30/18 1526  Temp    Pulse 86 07/30/18 1527  Resp 17 07/30/18 1527  SpO2 99 % 07/30/18 1527  Vitals shown include unvalidated device data.  Last Pain:  Vitals:   07/30/18 1139  TempSrc:   PainSc: 0-No pain      Patients Stated Pain Goal: 4 (97/94/80 1655)  Complications: No apparent anesthesia complications

## 2018-07-30 NOTE — Anesthesia Postprocedure Evaluation (Signed)
Anesthesia Post Note  Patient: Gabriel Brewer  Procedure(s) Performed: LAPAROSCOPIC CHOLECYSTECTOMY WITH INTRAOPERATIVE CHOLANGIOGRAM (N/A )     Patient location during evaluation: PACU Anesthesia Type: General Level of consciousness: awake and alert Pain management: pain level controlled Vital Signs Assessment: post-procedure vital signs reviewed and stable Respiratory status: spontaneous breathing, nonlabored ventilation and respiratory function stable Cardiovascular status: blood pressure returned to baseline and stable Postop Assessment: no apparent nausea or vomiting Anesthetic complications: no    Last Vitals:  Vitals:   07/30/18 1600 07/30/18 1615  BP: 126/69 124/68  Pulse: 82 77  Resp: 14 12  Temp:  (!) 36.4 C  SpO2: 90% 95%    Last Pain:  Vitals:   07/30/18 1600  TempSrc:   PainSc: 3                  Lidia Collum

## 2018-07-30 NOTE — Anesthesia Procedure Notes (Signed)
Procedure Name: Intubation Date/Time: 07/30/2018 1:54 PM Performed by: Eben Burow, CRNA Pre-anesthesia Checklist: Patient identified, Emergency Drugs available, Suction available, Patient being monitored and Timeout performed Patient Re-evaluated:Patient Re-evaluated prior to induction Oxygen Delivery Method: Circle system utilized Preoxygenation: Pre-oxygenation with 100% oxygen Induction Type: IV induction Ventilation: Mask ventilation without difficulty Laryngoscope Size: Mac and 4 Grade View: Grade I Tube type: Oral Tube size: 7.5 mm Number of attempts: 1 Airway Equipment and Method: Stylet Placement Confirmation: ETT inserted through vocal cords under direct vision,  positive ETCO2 and breath sounds checked- equal and bilateral Secured at: 23 cm Tube secured with: Tape Dental Injury: Teeth and Oropharynx as per pre-operative assessment

## 2018-07-30 NOTE — Op Note (Signed)
Gabriel Brewer [id number] 12-Nov-1940 07/30/2018  Laparoscopic Cholecystectomy with IOC Procedure Note  Indications: This patient presents with symptomatic gallbladder disease and will undergo laparoscopic cholecystectomy.  Pre-operative Diagnosis: symptomatic cholelithiasis  Post-operative Diagnosis: chronic calculous cholecystitis  Surgeon: Greer Pickerel MD FACS  Assistants:  none  Anesthesia: GETA  Procedure Details  The patient was seen again in the Holding Room. The risks, benefits, complications, treatment options, and expected outcomes were discussed with the patient. The possibilities of reaction to medication, pulmonary aspiration, perforation of viscus, bleeding, recurrent infection, finding a normal gallbladder, the need for additional procedures, failure to diagnose a condition, the possible need to convert to an open procedure, and creating a complication requiring transfusion or operation were discussed with the patient. The likelihood of improving the patient's symptoms with return to their baseline status is good.  The patient and/or family concurred with the proposed plan, giving informed consent. The site of surgery properly noted. The patient was taken to Operating Room, identified as Gabriel Brewer and the procedure verified as Laparoscopic Cholecystectomy with Intraoperative Cholangiogram. A Time Out was held and the above information confirmed. Antibiotic prophylaxis was administered.   Prior to the induction of general anesthesia, antibiotic prophylaxis was administered. General endotracheal anesthesia was then administered and tolerated well. After the induction, the abdomen was prepped with Chloraprep and draped in the sterile fashion. The patient was positioned in the supine position.  Local anesthetic agent was injected into the skin near the umbilicus and an incision made. We dissected down to the abdominal fascia with blunt dissection.  The fascia was incised vertically and  we entered the peritoneal cavity bluntly.  A pursestring suture of 0-Vicryl was placed around the fascial opening.  The Hasson cannula was inserted and secured with the stay suture.  Pneumoperitoneum was then created with CO2 and tolerated well without any adverse changes in the patient's vital signs. An 5-mm port was placed in the subxiphoid position.  Two 5-mm ports were placed in the right upper quadrant. All skin incisions were infiltrated with a local anesthetic agent before making the incision and placing the trocars.   We positioned the patient in reverse Trendelenburg, tilted slightly to the patient's left.  The gallbladder was identified, the fundus grasped and retracted cephalad. Adhesions were lysed bluntly and with the electrocautery where indicated, taking care not to injure any adjacent organs or viscus. The infundibulum was grasped and retracted laterally, exposing the peritoneum overlying the triangle of Calot. This was then divided and exposed in a blunt fashion. A critical view of the cystic duct and cystic artery was obtained.  The cystic duct was clearly identified and bluntly dissected circumferentially. The cystic duct was ligated with a clip distally.   An incision was made in the cystic duct and the [facility] cholangiogram catheter introduced. The catheter was secured using a clip. A cholangiogram was then obtained which showed good visualization of the distal and proximal biliary tree with no sign of filling defects or obstruction.  Contrast flowed easily into the duodenum. The catheter was then removed.   The cystic duct was then ligated with clips and divided. The cystic artery which had been identified & dissected free was ligated with clips and divided as well.   The gallbladder was dissected from the liver bed in retrograde fashion with the electrocautery.  There was some spillage of bile and gallstones from the gallbladder.  The stones were extracted.  The gallbladder was removed and  placed in an Ecco sac.  The gallbladder and Ecco sac were then removed through the umbilical port site. The liver bed was irrigated and inspected. Hemostasis was achieved with the electrocautery. Copious irrigation was utilized and was repeatedly aspirated until clear.  I did place a piece of Ethicon surgical snow in the gallbladder fossa.  Unfortunately the pursestring suture had pulled out while I was extracting the specimen and the specimen bag.  I therefore closed the umbilical fascial defect with 3 interrupted 0 Vicryl sutures using the PMI suture passer with laparoscopic guidance.  We again inspected the right upper quadrant for hemostasis.  The umbilical closure was inspected and there was no air leak and nothing trapped within the closure. Pneumoperitoneum was released as we removed the trocars.  4-0 Monocryl was used to close the skin.   Dermabond was applied. The patient was then extubated and brought to the recovery room in stable condition. Instrument, sponge, and needle counts were correct at closure and at the conclusion of the case.   Findings: Chronic Cholecystitis with Cholelithiasis  Estimated Blood Loss: Minimal         Drains: none         Specimens: Gallbladder           Complications: None; patient tolerated the procedure well.         Disposition: PACU - hemodynamically stable.         Condition: stable  Leighton Ruff. Redmond Pulling, MD, FACS General, Bariatric, & Minimally Invasive Surgery [facility], [place]

## 2018-07-30 NOTE — H&P (Signed)
Gabriel Brewer is an 78 y.o. male.   Chief Complaint: here for surgery HPI: 78 yo male presents for lap chole with ioc. Denies any medical changes since initially seen.    The patient is a 78 year old male who presents for evaluation of gall stones. He is referred by Dr Edmonia James for evaluation of gallbladder disease. He states that he had an episode a few months ago specifically February 28 where he developed severe epigastric pain while eating dinner at a restaurant. It did not get better. It prompted him to go to the emergency room. He states that he was told he had pancreatitis and to follow with his primary care doctor. He states that he saw his primary care doctor the following Monday and was told that he had gallstones and that was probably the source of his pain that he had the other night. He was referred to a general surgeon who he saw. He states that after speaking with vaginal surgeon he wanted to talk to another one. He denies any additional episodes since that episode at the restaurant with his wife. He did not have nausea or vomiting with it. It did not radiate. He had no fever or chills with it. He reports maybe a similar episode about 2 years ago where he went to the emergency room with epigastric pain that radiated to his back. He had had a colonoscopy the day before and was told that he probably just had trapped gas. In the emergency room in February he had a mild leukocytosis, normal LFTs, and a very mildly elevated lipase of 53. He subsequently had an ultrasound which showed numerous gallstones. He is on a blood thinner for paroxysmal A. fib. He was hospitalized in October 2018 for pneumonia. He had an echocardiogram in December 2019 that was essentially normal except for some grade 1 diastolic dysfunction. He denies any chest tightness or chest pressure or angina. He denies any shortness of breath at rest. He denies any current TIAs or amaurosis fugax. He denies any  melena or hematochezia. He denies any prior abdominal surgery  Past Medical History:  Diagnosis Date  . Cancer ([place]) 2017   skin   . COPD (chronic obstructive pulmonary disease) ([place])   . Diabetes mellitus without complication ([place])   . GERD (gastroesophageal reflux disease)   . Hyperlipidemia   . Hypertension   . Mitral regurgitation   . PAF (paroxysmal atrial fibrillation) ([place])   . Stroke [facility])     Past Surgical History:  Procedure Laterality Date  . COLONOSCOPY WITH PROPOFOL N/A 11/17/2017   Procedure: COLONOSCOPY WITH PROPOFOL;  Surgeon: Jonathon Bellows, MD;  Location: [facility] ENDOSCOPY;  Service: Gastroenterology;  Laterality: N/A;  . ESOPHAGOGASTRODUODENOSCOPY (EGD) WITH PROPOFOL N/A 11/17/2017   Procedure: ESOPHAGOGASTRODUODENOSCOPY (EGD) WITH PROPOFOL;  Surgeon: Jonathon Bellows, MD;  Location[facility] ENDOSCOPY;  Service: Gastroenterology;  Laterality: N/A;  . GIVENS CAPSULE STUDY N/A 12/09/2017   Procedure: GIVENS CAPSULE STUDY;  Surgeon: Jonathon Bellows, MD;  Location: [facility] ENDOSCOPY;  Service: Gastroenterology;  Laterality: N/A;  . TONSILLECTOMY      Family History  Problem Relation Age of Onset  . Cerebral aneurysm Mother   . Heart disease Father   . COPD Father   . Breast cancer Sister   . Cancer Brother        unknowsn   Social History:  reports that he quit smoking about 35 years ago. His smoking use included cigarettes. He has a 10.00 pack-year smoking history. He  has never used smokeless tobacco. He reports that he does not drink alcohol or use drugs.  Allergies: No Known Allergies  Medications Prior to Admission  Medication Sig Dispense Refill  . atorvastatin (LIPITOR) 10 MG tablet Take 1 tablet (10 mg total) by mouth daily. 90 tablet 3  . benazepril (LOTENSIN) 40 MG tablet Take 1 tablet (40 mg total) by mouth every evening. (Patient taking differently: Take 40 mg by mouth at bedtime. ) 30 tablet 2  . Cholecalciferol 4000 units CAPS Take 4,000 Units by mouth every Friday.      . diltiazem (CARDIZEM CD) 180 MG 24 hr capsule Take 1 capsule (180 mg total) by mouth daily. 90 capsule 3  . fluticasone (FLONASE) 50 MCG/ACT nasal spray Place 1 spray into both nostrils daily as needed for allergies.     . hydrochlorothiazide (HYDRODIURIL) 12.5 MG tablet Take 12.5 mg by mouth daily.     . metFORMIN (GLUCOPHAGE) 500 MG tablet TAKE 1 TABLET(500 MG) BY MOUTH TWICE DAILY (Patient taking differently: Take 500 mg by mouth 2 (two) times a day. ) 180 tablet 0  . omeprazole (PRILOSEC) 40 MG capsule Take 1 capsule (40 mg total) by mouth daily. 90 capsule 3  . rivaroxaban (XARELTO) 20 MG TABS tablet Take 1 tablet (20 mg total) by mouth daily with supper. (Patient taking differently: Take 20 mg by mouth at bedtime. ) 30 tablet 2    Results for orders placed or performed during the hospital encounter of 07/30/18 (from the past 48 hour(s))  Protime-INR     Status: None   Collection Time: 07/30/18 11:17 AM  Result Value Ref Range   Prothrombin Time 13.6 11.4 - 15.2 seconds   INR 1.1 0.8 - 1.2    Comment: (NOTE) INR goal varies based on device and disease states. Performed at [facility], [place] [address], [place],  [id number]   Glucose, capillary     Status: Abnormal   Collection Time: 07/30/18 11:24 AM  Result Value Ref Range   Glucose-Capillary 121 (H) 70 - 99 mg/dL   No results found.  Review of Systems  Constitutional: Negative for weight loss.  HENT: Negative for nosebleeds.   Eyes: Negative for blurred vision.  Respiratory: Negative for shortness of breath.   Cardiovascular: Negative for chest pain, palpitations, orthopnea and PND.       Denies DOE  Genitourinary: Negative for dysuria and hematuria.  Musculoskeletal: Negative.   Skin: Negative for itching and rash.  Neurological: Negative for dizziness, focal weakness, seizures, loss of consciousness and headaches.       Denies TIAs, amaurosis fugax  Endo/Heme/Allergies: Does not bruise/bleed  easily.  Psychiatric/Behavioral: The patient is not nervous/anxious.     Blood pressure (!) 158/73, pulse 88, temperature 97.8 F (36.6 C), temperature source Oral, resp. rate 18, height 5\' 11"  (1.803 m), weight 77.7 kg, SpO2 100 %. Physical Exam  Vitals reviewed. Constitutional: He is oriented to person, place, and time. He appears well-developed and well-nourished. No distress.  HENT:  Head: Normocephalic and atraumatic.  Right Ear: External ear normal.  Left Ear: External ear normal.  Eyes: Conjunctivae are normal. No scleral icterus.  Neck: Normal range of motion. Neck supple. No tracheal deviation present. No thyromegaly present.  Cardiovascular: Normal rate and normal heart sounds.  Respiratory: Effort normal and breath sounds normal. No stridor. No respiratory distress. He has no wheezes.  GI: Soft. He exhibits no distension. There is no abdominal tenderness.  Musculoskeletal:  General: No tenderness or edema.  Lymphadenopathy:    He has no cervical adenopathy.  Neurological: He is alert and oriented to person, place, and time. He exhibits normal muscle tone.  Skin: Skin is warm and dry. No rash noted. He is not diaphoretic. No erythema. No pallor.  Psychiatric: He has a normal mood and affect. His behavior is normal. Judgment and thought content normal.     Assessment/Plan Symptomatic cholelithiasis HTN PAF Antiplatelet use  To OR for lap chole with ioc All questions askedn and answered     Greer Pickerel, MD 07/30/2018, 12:48 PM

## 2018-07-31 ENCOUNTER — Encounter (HOSPITAL_COMMUNITY): Payer: Self-pay | Admitting: General Surgery

## 2018-08-12 ENCOUNTER — Encounter: Payer: Self-pay | Admitting: Oncology

## 2018-08-12 ENCOUNTER — Encounter: Payer: Self-pay | Admitting: Emergency Medicine

## 2018-08-12 ENCOUNTER — Other Ambulatory Visit: Payer: Self-pay

## 2018-08-12 ENCOUNTER — Encounter: Payer: Self-pay | Admitting: Family Medicine

## 2018-08-12 DIAGNOSIS — I480 Paroxysmal atrial fibrillation: Secondary | ICD-10-CM | POA: Diagnosis not present

## 2018-08-12 DIAGNOSIS — R531 Weakness: Secondary | ICD-10-CM | POA: Diagnosis present

## 2018-08-12 DIAGNOSIS — K922 Gastrointestinal hemorrhage, unspecified: Secondary | ICD-10-CM | POA: Diagnosis not present

## 2018-08-12 DIAGNOSIS — Z7901 Long term (current) use of anticoagulants: Secondary | ICD-10-CM

## 2018-08-12 DIAGNOSIS — Z8719 Personal history of other diseases of the digestive system: Secondary | ICD-10-CM

## 2018-08-12 DIAGNOSIS — Z20828 Contact with and (suspected) exposure to other viral communicable diseases: Secondary | ICD-10-CM | POA: Diagnosis not present

## 2018-08-12 DIAGNOSIS — K31811 Angiodysplasia of stomach and duodenum with bleeding: Secondary | ICD-10-CM | POA: Diagnosis not present

## 2018-08-12 DIAGNOSIS — Z87891 Personal history of nicotine dependence: Secondary | ICD-10-CM

## 2018-08-12 DIAGNOSIS — Z9049 Acquired absence of other specified parts of digestive tract: Secondary | ICD-10-CM

## 2018-08-12 DIAGNOSIS — Z803 Family history of malignant neoplasm of breast: Secondary | ICD-10-CM

## 2018-08-12 DIAGNOSIS — J449 Chronic obstructive pulmonary disease, unspecified: Secondary | ICD-10-CM | POA: Diagnosis present

## 2018-08-12 DIAGNOSIS — J90 Pleural effusion, not elsewhere classified: Secondary | ICD-10-CM | POA: Diagnosis not present

## 2018-08-12 DIAGNOSIS — Z8249 Family history of ischemic heart disease and other diseases of the circulatory system: Secondary | ICD-10-CM

## 2018-08-12 DIAGNOSIS — K219 Gastro-esophageal reflux disease without esophagitis: Secondary | ICD-10-CM | POA: Diagnosis present

## 2018-08-12 DIAGNOSIS — I10 Essential (primary) hypertension: Secondary | ICD-10-CM | POA: Diagnosis present

## 2018-08-12 DIAGNOSIS — Z825 Family history of asthma and other chronic lower respiratory diseases: Secondary | ICD-10-CM

## 2018-08-12 DIAGNOSIS — Z7984 Long term (current) use of oral hypoglycemic drugs: Secondary | ICD-10-CM

## 2018-08-12 DIAGNOSIS — D500 Iron deficiency anemia secondary to blood loss (chronic): Secondary | ICD-10-CM | POA: Diagnosis not present

## 2018-08-12 DIAGNOSIS — Z8673 Personal history of transient ischemic attack (TIA), and cerebral infarction without residual deficits: Secondary | ICD-10-CM

## 2018-08-12 DIAGNOSIS — E119 Type 2 diabetes mellitus without complications: Secondary | ICD-10-CM | POA: Diagnosis present

## 2018-08-12 DIAGNOSIS — D62 Acute posthemorrhagic anemia: Secondary | ICD-10-CM | POA: Diagnosis not present

## 2018-08-12 LAB — CBC
HCT: 27.4 % — ABNORMAL LOW (ref 39.0–52.0)
Hemoglobin: 9.2 g/dL — ABNORMAL LOW (ref 13.0–17.0)
MCH: 30.1 pg (ref 26.0–34.0)
MCHC: 33.6 g/dL (ref 30.0–36.0)
MCV: 89.5 fL (ref 80.0–100.0)
Platelets: 296 10*3/uL (ref 150–400)
RBC: 3.06 MIL/uL — ABNORMAL LOW (ref 4.22–5.81)
RDW: 15 % (ref 11.5–15.5)
WBC: 12.4 10*3/uL — ABNORMAL HIGH (ref 4.0–10.5)
nRBC: 0.5 % — ABNORMAL HIGH (ref 0.0–0.2)

## 2018-08-12 LAB — BASIC METABOLIC PANEL
Anion gap: 10 (ref 5–15)
BUN: 40 mg/dL — ABNORMAL HIGH (ref 8–23)
CO2: 22 mmol/L (ref 22–32)
Calcium: 8.8 mg/dL — ABNORMAL LOW (ref 8.9–10.3)
Chloride: 103 mmol/L (ref 98–111)
Creatinine, Ser: 1.27 mg/dL — ABNORMAL HIGH (ref 0.61–1.24)
GFR calc Af Amer: >60 mL/min (ref >60)
GFR calc non Af Amer: 54 mL/min — ABNORMAL LOW (ref >60)
Glucose, Bld: 159 mg/dL — ABNORMAL HIGH (ref 70–99)
Potassium: 3.6 mmol/L (ref 3.5–5.1)
Sodium: 135 mmol/L (ref 135–145)

## 2018-08-12 LAB — TROPONIN I (HIGH SENSITIVITY): Troponin I (High Sensitivity): 4 ng/L (ref <18)

## 2018-08-12 NOTE — ED Triage Notes (Signed)
Pt arrives with concerns over possible anemia. Pt states he currently feels weak and exertionally "winded." Pt states those are typical feelings when he is anemic. Pt denies current pain.

## 2018-08-13 ENCOUNTER — Inpatient Hospital Stay
Admission: EM | Admit: 2018-08-13 | Discharge: 2018-08-15 | DRG: 378 | Disposition: A | Discharge status: Home / Self Care | Payer: Medicare Other | Attending: Internal Medicine | Admitting: Internal Medicine

## 2018-08-13 ENCOUNTER — Other Ambulatory Visit: Payer: Self-pay

## 2018-08-13 ENCOUNTER — Emergency Department: Payer: Medicare Other

## 2018-08-13 ENCOUNTER — Encounter: Payer: Self-pay | Admitting: Radiology

## 2018-08-13 ENCOUNTER — Inpatient Hospital Stay: Payer: Medicare Other

## 2018-08-13 DIAGNOSIS — Z9049 Acquired absence of other specified parts of digestive tract: Secondary | ICD-10-CM | POA: Diagnosis not present

## 2018-08-13 DIAGNOSIS — R531 Weakness: Secondary | ICD-10-CM | POA: Diagnosis present

## 2018-08-13 DIAGNOSIS — I480 Paroxysmal atrial fibrillation: Secondary | ICD-10-CM | POA: Diagnosis present

## 2018-08-13 DIAGNOSIS — K219 Gastro-esophageal reflux disease without esophagitis: Secondary | ICD-10-CM | POA: Diagnosis present

## 2018-08-13 DIAGNOSIS — Z8673 Personal history of transient ischemic attack (TIA), and cerebral infarction without residual deficits: Secondary | ICD-10-CM | POA: Diagnosis not present

## 2018-08-13 DIAGNOSIS — K921 Melena: Secondary | ICD-10-CM | POA: Diagnosis not present

## 2018-08-13 DIAGNOSIS — Z8719 Personal history of other diseases of the digestive system: Secondary | ICD-10-CM | POA: Diagnosis not present

## 2018-08-13 DIAGNOSIS — E119 Type 2 diabetes mellitus without complications: Secondary | ICD-10-CM | POA: Diagnosis present

## 2018-08-13 DIAGNOSIS — D500 Iron deficiency anemia secondary to blood loss (chronic): Secondary | ICD-10-CM

## 2018-08-13 DIAGNOSIS — K31811 Angiodysplasia of stomach and duodenum with bleeding: Secondary | ICD-10-CM | POA: Diagnosis present

## 2018-08-13 DIAGNOSIS — Z20828 Contact with and (suspected) exposure to other viral communicable diseases: Secondary | ICD-10-CM | POA: Diagnosis present

## 2018-08-13 DIAGNOSIS — D62 Acute posthemorrhagic anemia: Secondary | ICD-10-CM

## 2018-08-13 DIAGNOSIS — K922 Gastrointestinal hemorrhage, unspecified: Secondary | ICD-10-CM | POA: Diagnosis not present

## 2018-08-13 DIAGNOSIS — D739 Disease of spleen, unspecified: Secondary | ICD-10-CM | POA: Diagnosis not present

## 2018-08-13 DIAGNOSIS — D649 Anemia, unspecified: Secondary | ICD-10-CM | POA: Diagnosis not present

## 2018-08-13 DIAGNOSIS — J90 Pleural effusion, not elsewhere classified: Secondary | ICD-10-CM | POA: Diagnosis not present

## 2018-08-13 DIAGNOSIS — Z87891 Personal history of nicotine dependence: Secondary | ICD-10-CM | POA: Diagnosis not present

## 2018-08-13 DIAGNOSIS — Z7984 Long term (current) use of oral hypoglycemic drugs: Secondary | ICD-10-CM | POA: Diagnosis not present

## 2018-08-13 DIAGNOSIS — Z825 Family history of asthma and other chronic lower respiratory diseases: Secondary | ICD-10-CM | POA: Diagnosis not present

## 2018-08-13 DIAGNOSIS — K449 Diaphragmatic hernia without obstruction or gangrene: Secondary | ICD-10-CM | POA: Diagnosis not present

## 2018-08-13 DIAGNOSIS — I4891 Unspecified atrial fibrillation: Secondary | ICD-10-CM | POA: Diagnosis not present

## 2018-08-13 DIAGNOSIS — Z8249 Family history of ischemic heart disease and other diseases of the circulatory system: Secondary | ICD-10-CM | POA: Diagnosis not present

## 2018-08-13 DIAGNOSIS — Q2733 Arteriovenous malformation of digestive system vessel: Secondary | ICD-10-CM | POA: Diagnosis not present

## 2018-08-13 DIAGNOSIS — Z7901 Long term (current) use of anticoagulants: Secondary | ICD-10-CM | POA: Diagnosis not present

## 2018-08-13 DIAGNOSIS — I10 Essential (primary) hypertension: Secondary | ICD-10-CM | POA: Diagnosis present

## 2018-08-13 DIAGNOSIS — J449 Chronic obstructive pulmonary disease, unspecified: Secondary | ICD-10-CM | POA: Diagnosis present

## 2018-08-13 DIAGNOSIS — Z803 Family history of malignant neoplasm of breast: Secondary | ICD-10-CM | POA: Diagnosis not present

## 2018-08-13 DIAGNOSIS — K4090 Unilateral inguinal hernia, without obstruction or gangrene, not specified as recurrent: Secondary | ICD-10-CM | POA: Diagnosis not present

## 2018-08-13 DIAGNOSIS — K5730 Diverticulosis of large intestine without perforation or abscess without bleeding: Secondary | ICD-10-CM | POA: Diagnosis not present

## 2018-08-13 LAB — GLUCOSE, CAPILLARY
Glucose-Capillary: 111 mg/dL — ABNORMAL HIGH (ref 70–99)
Glucose-Capillary: 114 mg/dL — ABNORMAL HIGH (ref 70–99)
Glucose-Capillary: 104 mg/dL — ABNORMAL HIGH (ref 70–99)
Glucose-Capillary: 106 mg/dL — ABNORMAL HIGH (ref 70–99)

## 2018-08-13 LAB — IRON AND TIBC
Iron: 29 ug/dL — ABNORMAL LOW (ref 45–182)
Saturation Ratios: 9 % — ABNORMAL LOW (ref 17.9–39.5)
TIBC: 321 ug/dL (ref 250–450)
UIBC: 292 ug/dL

## 2018-08-13 LAB — HEMOGLOBIN AND HEMATOCRIT, BLOOD
HCT: 27.9 % — ABNORMAL LOW (ref 39.0–52.0)
HCT: 27.3 % — ABNORMAL LOW (ref 39.0–52.0)
HCT: 26.4 % — ABNORMAL LOW (ref 39.0–52.0)
Hemoglobin: 9.2 g/dL — ABNORMAL LOW (ref 13.0–17.0)
Hemoglobin: 9.3 g/dL — ABNORMAL LOW (ref 13.0–17.0)
Hemoglobin: 9 g/dL — ABNORMAL LOW (ref 13.0–17.0)

## 2018-08-13 LAB — COMPREHENSIVE METABOLIC PANEL
ALT: 16 U/L (ref 0–44)
AST: 17 U/L (ref 15–41)
Albumin: 3.6 g/dL (ref 3.5–5.0)
Alkaline Phosphatase: 53 U/L (ref 38–126)
Anion gap: 8 (ref 5–15)
BUN: 26 mg/dL — ABNORMAL HIGH (ref 8–23)
CO2: 21 mmol/L — ABNORMAL LOW (ref 22–32)
Calcium: 8.5 mg/dL — ABNORMAL LOW (ref 8.9–10.3)
Chloride: 109 mmol/L (ref 98–111)
Creatinine, Ser: 1.04 mg/dL (ref 0.61–1.24)
GFR calc Af Amer: >60 mL/min (ref >60)
GFR calc non Af Amer: >60 mL/min (ref >60)
Glucose, Bld: 123 mg/dL — ABNORMAL HIGH (ref 70–99)
Potassium: 3.9 mmol/L (ref 3.5–5.1)
Sodium: 138 mmol/L (ref 135–145)
Total Bilirubin: 1.1 mg/dL (ref 0.3–1.2)
Total Protein: 5.9 g/dL — ABNORMAL LOW (ref 6.5–8.1)

## 2018-08-13 LAB — URINALYSIS, COMPLETE (UACMP) WITH MICROSCOPIC
Bacteria, UA: NONE SEEN
Bilirubin Urine: NEGATIVE
Glucose, UA: NEGATIVE mg/dL
Hgb urine dipstick: NEGATIVE
Ketones, ur: NEGATIVE mg/dL
Leukocytes,Ua: NEGATIVE
Nitrite: NEGATIVE
Protein, ur: NEGATIVE mg/dL
Specific Gravity, Urine: 1.013 (ref 1.005–1.030)
Squamous Epithelial / HPF: NONE SEEN (ref 0–5)
WBC, UA: NONE SEEN WBC/hpf (ref 0–5)
pH: 5 (ref 5.0–8.0)

## 2018-08-13 LAB — PREPARE RBC (CROSSMATCH)

## 2018-08-13 LAB — BASIC METABOLIC PANEL
Anion gap: 8 (ref 5–15)
BUN: 36 mg/dL — ABNORMAL HIGH (ref 8–23)
CO2: 22 mmol/L (ref 22–32)
Calcium: 8.3 mg/dL — ABNORMAL LOW (ref 8.9–10.3)
Chloride: 108 mmol/L (ref 98–111)
Creatinine, Ser: 1.06 mg/dL (ref 0.61–1.24)
GFR calc Af Amer: >60 mL/min (ref >60)
GFR calc non Af Amer: >60 mL/min (ref >60)
Glucose, Bld: 132 mg/dL — ABNORMAL HIGH (ref 70–99)
Potassium: 4.1 mmol/L (ref 3.5–5.1)
Sodium: 138 mmol/L (ref 135–145)

## 2018-08-13 LAB — CBC
HCT: 23.8 % — ABNORMAL LOW (ref 39.0–52.0)
Hemoglobin: 7.8 g/dL — ABNORMAL LOW (ref 13.0–17.0)
MCH: 29.5 pg (ref 26.0–34.0)
MCHC: 32.8 g/dL (ref 30.0–36.0)
MCV: 90.2 fL (ref 80.0–100.0)
Platelets: 214 10*3/uL (ref 150–400)
RBC: 2.64 MIL/uL — ABNORMAL LOW (ref 4.22–5.81)
RDW: 14.9 % (ref 11.5–15.5)
WBC: 9.8 10*3/uL (ref 4.0–10.5)
nRBC: 0 % (ref 0.0–0.2)

## 2018-08-13 LAB — HEMOGLOBIN A1C
Hgb A1c MFr Bld: 5.1 % (ref 4.8–5.6)
Mean Plasma Glucose: 99.67 mg/dL

## 2018-08-13 LAB — APTT: aPTT: 58 seconds — ABNORMAL HIGH (ref 24–36)

## 2018-08-13 LAB — TROPONIN I (HIGH SENSITIVITY): Troponin I (High Sensitivity): 5 ng/L (ref <18)

## 2018-08-13 LAB — PROTIME-INR
INR: 3.5 — ABNORMAL HIGH (ref 0.8–1.2)
Prothrombin Time: 34.4 seconds — ABNORMAL HIGH (ref 11.4–15.2)

## 2018-08-13 MED ORDER — ONDANSETRON HCL 4 MG/2ML IJ SOLN: 4.0000 mg | Freq: Four times a day (QID) | INTRAMUSCULAR | Status: DC | PRN

## 2018-08-13 MED ORDER — METFORMIN HCL 500 MG PO TABS: 500.0000 mg | ORAL_TABLET | Freq: Two times a day (BID) | ORAL | Status: DC

## 2018-08-13 MED ORDER — DILTIAZEM HCL ER COATED BEADS 180 MG PO CP24
180.0000 mg | ORAL_CAPSULE | Freq: Every day | ORAL | Status: DC
  Administered 2018-08-13 – 2018-08-15 (×3): 180 mg via ORAL
  Filled 2018-08-13 (×3): qty 1

## 2018-08-13 MED ORDER — ATORVASTATIN CALCIUM 10 MG PO TABS
10.0000 mg | ORAL_TABLET | Freq: Every day | ORAL | Status: DC
  Administered 2018-08-14: 10 mg via ORAL
  Filled 2018-08-13: qty 1

## 2018-08-13 MED ORDER — SODIUM CHLORIDE 0.9 % IV SOLN
200.0000 mg | Freq: Every day | INTRAVENOUS | Status: AC
  Administered 2018-08-13 – 2018-08-15 (×3): 200 mg via INTRAVENOUS
  Filled 2018-08-13 (×3): qty 10

## 2018-08-13 MED ORDER — VITAMIN D 25 MCG (1000 UNIT) PO TABS
4000.0000 [IU] | ORAL_TABLET | ORAL | Status: DC
  Administered 2018-08-14: 4000 [IU] via ORAL
  Filled 2018-08-13: qty 4

## 2018-08-13 MED ORDER — INSULIN ASPART 100 UNIT/ML ~~LOC~~ SOLN: 0.0000 [IU] | Freq: Every day | SUBCUTANEOUS | Status: DC

## 2018-08-13 MED ORDER — INSULIN ASPART 100 UNIT/ML ~~LOC~~ SOLN: 0.0000 [IU] | Freq: Three times a day (TID) | SUBCUTANEOUS | Status: DC

## 2018-08-13 MED ORDER — SODIUM CHLORIDE 0.9 % IV SOLN
8.0000 mg/h | INTRAVENOUS | Status: DC
  Administered 2018-08-13 – 2018-08-15 (×6): 8 mg/h via INTRAVENOUS
  Filled 2018-08-13 (×6): qty 80

## 2018-08-13 MED ORDER — PANTOPRAZOLE SODIUM 40 MG IV SOLR: 40.0000 mg | Freq: Two times a day (BID) | INTRAVENOUS | Status: DC

## 2018-08-13 MED ORDER — ONDANSETRON HCL 4 MG PO TABS: 4.0000 mg | ORAL_TABLET | Freq: Four times a day (QID) | ORAL | Status: DC | PRN

## 2018-08-13 MED ORDER — SODIUM CHLORIDE 0.9 % IV SOLN
80.0000 mg | Freq: Once | INTRAVENOUS | Status: AC
  Administered 2018-08-13: 80 mg via INTRAVENOUS
  Filled 2018-08-13 (×2): qty 80

## 2018-08-13 MED ORDER — TRAZODONE HCL 50 MG PO TABS: 50.0000 mg | ORAL_TABLET | Freq: Every evening | ORAL | Status: DC | PRN

## 2018-08-13 MED ORDER — ACETAMINOPHEN 650 MG RE SUPP: 650.0000 mg | Freq: Four times a day (QID) | RECTAL | Status: DC | PRN

## 2018-08-13 MED ORDER — SODIUM CHLORIDE 0.9 % IV SOLN
10.0000 mL/h | Freq: Once | INTRAVENOUS | Status: AC
  Administered 2018-08-13: 10 mL/h via INTRAVENOUS

## 2018-08-13 MED ORDER — IOHEXOL 240 MG/ML SOLN
25.0000 mL | INTRAMUSCULAR | Status: AC
  Administered 2018-08-13: 25 mL via ORAL

## 2018-08-13 MED ORDER — ACETAMINOPHEN 325 MG PO TABS: 650.0000 mg | ORAL_TABLET | Freq: Four times a day (QID) | ORAL | Status: DC | PRN

## 2018-08-13 MED ORDER — TRAMADOL HCL 50 MG PO TABS: 50.0000 mg | ORAL_TABLET | Freq: Four times a day (QID) | ORAL | Status: DC | PRN

## 2018-08-13 MED ORDER — SODIUM CHLORIDE 0.9% IV SOLUTION
Freq: Once | INTRAVENOUS | Status: AC
  Administered 2018-08-13: 05:00:00 via INTRAVENOUS

## 2018-08-13 MED ORDER — IOHEXOL 300 MG/ML  SOLN
100.0000 mL | Freq: Once | INTRAMUSCULAR | Status: AC | PRN
  Administered 2018-08-13: 100 mL via INTRAVENOUS

## 2018-08-13 MED ORDER — HYDROCHLOROTHIAZIDE 25 MG PO TABS: 12.5000 mg | ORAL_TABLET | Freq: Every day | ORAL | Status: DC

## 2018-08-13 MED ORDER — BENAZEPRIL HCL 20 MG PO TABS: 40.0000 mg | ORAL_TABLET | Freq: Every day | ORAL | Status: DC

## 2018-08-13 NOTE — ED Notes (Signed)
Report taken by Wille Glaser, RN

## 2018-08-13 NOTE — H&P (Signed)
[place] [facility] [place] NAME: Gabriel Brewer    MR#:  [id number]  DATE OF BIRTH:  Sep 12, 1940  DATE OF ADMISSION:  08/13/2018  PRIMARY CARE PHYSICIAN: Elby Beck, FNP   REQUESTING/REFERRING PHYSICIAN: [place]:   Chief Complaint  Patient presents with  . Weakness    HISTORY OF PRESENT ILLNESS:  Gabriel Brewer  is a 78 y.o. male with a known history of diverticulosis, colon polyps, proximal small bowel AVM, and a history of hemorrhoids, as well as a history of atrial fibrillation currently on Xarelto, hypertension, diabetes.  Additionally, he has a history of iron deficiency anemia and receives iron transfusions at the cancer center followed by Dr. Tasia Catchings.  His last transfusion was received in January 2020.  He presented to the emergency room complaining of a 2-day history of generalized fatigue and malaise having noticed a black stool earlier in the day.  Additionally, he also notes having been diagnosed with "prediabetes ".  He usually has glucose between 100 and 120.  He checked his glucose earlier today with glucose as high as 250.  He denies noticing increased shortness of breath.  He denies chest pain.  He denies fever, chills, nausea, vomiting.  He denies hematemesis.  He denies hematuria or hemoptysis.  He denies abdominal pain.  He is status post cholecystectomy 2 weeks ago with no complications since then.  On arrival, hematocrit is 27.4 with hemoglobin 9.2.  Glucose is 159.  INR is 1.1.  Hemoglobin A1c is 5.4.  COVID-19 testing was -2 weeks ago.  Repeat testing is pending results.  He is receiving 1 unit packed red blood cells in the emergency room.  He is being admitted for anemia and GI bleed with generalized weakness and history of iron deficiency anemia.  PAST MEDICAL HISTORY:   Past Medical History:  Diagnosis Date  . Cancer ([place]) 2017   skin   . COPD (chronic obstructive pulmonary disease) ([place])   .  Diabetes mellitus without complication ([place])   . GERD (gastroesophageal reflux disease)   . Hyperlipidemia   . Hypertension   . Mitral regurgitation   . PAF (paroxysmal atrial fibrillation) ([place])   . Stroke [facility])     PAST SURGICAL HISTORY:   Past Surgical History:  Procedure Laterality Date  . CHOLECYSTECTOMY N/A 07/30/2018   Procedure: LAPAROSCOPIC CHOLECYSTECTOMY WITH INTRAOPERATIVE CHOLANGIOGRAM;  Surgeon: Greer Pickerel, MD;  Location: [facility] ORS;  Service: General;  Laterality: N/A;  . COLONOSCOPY WITH PROPOFOL N/A 11/17/2017   Procedure: COLONOSCOPY WITH PROPOFOL;  Surgeon: Jonathon Bellows, MD;  Location: [facility] ENDOSCOPY;  Service: Gastroenterology;  Laterality: N/A;  . ESOPHAGOGASTRODUODENOSCOPY (EGD) WITH PROPOFOL N/A 11/17/2017   Procedure: ESOPHAGOGASTRODUODENOSCOPY (EGD) WITH PROPOFOL;  Surgeon: Jonathon Bellows, MD;  Location: [facility] ENDOSCOPY;  Service: Gastroenterology;  Laterality: N/A;  . GIVENS CAPSULE STUDY N/A 12/09/2017   Procedure: GIVENS CAPSULE STUDY;  Surgeon: Jonathon Bellows, MD;  Location: [facility] ENDOSCOPY;  Service: Gastroenterology;  Laterality: N/A;  . TONSILLECTOMY      SOCIAL HISTORY:   Social History   Tobacco Use  . Smoking status: Former Smoker    Packs/day: 0.50    Years: 20.00    Pack years: 10.00    Types: Cigarettes    Quit date: 11/20/1982    Years since quitting: 35.7  . Smokeless tobacco: Never Used  Substance Use Topics  . Alcohol use: No    FAMILY HISTORY:   Family History  Problem Relation  Age of Onset  . Cerebral aneurysm Mother   . Heart disease Father   . COPD Father   . Breast cancer Sister   . Cancer Brother        unknowsn    DRUG ALLERGIES:  No Known Allergies  REVIEW OF SYSTEMS:   Review of Systems  Constitutional: Positive for malaise/fatigue. Negative for chills, diaphoresis, fever and weight loss.  HENT: Negative for congestion, sinus pain and sore throat.   Eyes: Negative for blurred vision and double vision.  Respiratory:  Negative for cough, hemoptysis, sputum production, shortness of breath and wheezing.   Cardiovascular: Negative for chest pain and palpitations.  Gastrointestinal: Positive for melena. Negative for abdominal pain, constipation, diarrhea, heartburn, nausea and vomiting.  Genitourinary: Negative for dysuria, flank pain, frequency and hematuria.  Musculoskeletal: Negative for falls, joint pain and myalgias.  Skin: Negative.  Negative for itching and rash.  Neurological: Positive for weakness (generalized). Negative for dizziness and headaches.  Psychiatric/Behavioral: Negative.  Negative for depression.     MEDICATIONS AT HOME:   Prior to Admission medications   Medication Sig Start Date End Date Taking? Authorizing Provider  amoxicillin (AMOXIL) 500 MG capsule Take 500 mg by mouth. Take as needed before dental appointments. 07/14/18  Yes [provider]  atorvastatin (LIPITOR) 10 MG tablet Take 1 tablet (10 mg total) by mouth daily. 03/04/18  Yes Elby Beck, FNP  benazepril (LOTENSIN) 40 MG tablet Take 1 tablet (40 mg total) by mouth every evening. Patient taking differently: Take 40 mg by mouth at bedtime.  11/07/16  Yes Gladstone Lighter, MD  Cholecalciferol 4000 units CAPS Take 4,000 Units by mouth every Friday.    Yes [provider]  diltiazem (CARDIZEM CD) 180 MG 24 hr capsule Take 1 capsule (180 mg total) by mouth daily. 05/04/18  Yes Elby Beck, FNP  fluticasone (FLONASE) 50 MCG/ACT nasal spray Place 1 spray into both nostrils daily as needed for allergies.  08/15/17  Yes [provider]  hydrochlorothiazide (HYDRODIURIL) 12.5 MG tablet Take 12.5 mg by mouth daily.  04/21/18  Yes [provider]  metFORMIN (GLUCOPHAGE) 500 MG tablet TAKE 1 TABLET(500 MG) BY MOUTH TWICE DAILY Patient taking differently: Take 500 mg by mouth 2 (two) times a day.  05/16/18  Yes Elby Beck, FNP  omeprazole (PRILOSEC) 40 MG capsule Take 1 capsule (40 mg  total) by mouth daily. 05/04/18  Yes Elby Beck, FNP  rivaroxaban (XARELTO) 20 MG TABS tablet Take 1 tablet (20 mg total) by mouth daily with supper. Patient taking differently: Take 20 mg by mouth at bedtime.  11/07/16  Yes Gladstone Lighter, MD      VITAL SIGNS:  Blood pressure 118/81, pulse (!) 110, temperature 98.1 F (36.7 C), temperature source Oral, resp. rate 20, height 5\' 11"  (1.803 m), weight 76.7 kg, SpO2 100 %.  PHYSICAL EXAMINATION:  Physical Exam  GENERAL:  78 y.o.-year-old patient lying in the bed with no acute distress.  EYES: Pupils equal, round, reactive to light and accommodation. No scleral icterus. Extraocular muscles intact.  HEENT: Head atraumatic, normocephalic. Oropharynx and nasopharynx clear.  NECK:  Supple, no jugular venous distention. No thyroid enlargement, no tenderness.  LUNGS: Normal breath sounds bilaterally, no wheezing, rales,rhonchi or crepitation. No use of accessory muscles of respiration.  CARDIOVASCULAR: Regular rate and rhythm, S1, S2 normal. No murmurs, rubs, or gallops.  ABDOMEN: Soft, nondistended, nontender. Bowel sounds present. No organomegaly or mass.  EXTREMITIES: No pedal edema, cyanosis,  or clubbing.  NEUROLOGIC: Cranial nerves II through XII are intact. Muscle strength 5/5 in all extremities. Sensation intact. Gait not checked.  PSYCHIATRIC: The patient is alert and oriented x 3.  Normal affect and good eye contact. SKIN: No obvious rash, lesion, or ulcer.   LABORATORY PANEL:   CBC Recent Labs  Lab 08/12/18 2154  WBC 12.4*  HGB 9.2*  HCT 27.4*  PLT 296   ------------------------------------------------------------------------------------------------------------------  Chemistries  Recent Labs  Lab 08/12/18 2154  NA 135  K 3.6  CL 103  CO2 22  GLUCOSE 159*  BUN 40*  CREATININE 1.27*  CALCIUM 8.8*    ------------------------------------------------------------------------------------------------------------------  Cardiac Enzymes No results for input(s): TROPONINI in the last 168 hours. ------------------------------------------------------------------------------------------------------------------  RADIOLOGY:  Dg Chest 1 View  Result Date: 08/13/2018 CLINICAL DATA:  Weakness EXAM: CHEST  1 VIEW COMPARISON:  03/13/2018, 11/29/2017, CT 11/05/2016 FINDINGS: Hyperinflation with emphysematous disease. No acute consolidation or pleural effusion. Stable cardiomediastinal silhouette. No pneumothorax. Apically pleural thickening. Small stellate focus in the right suprahilar lung. IMPRESSION: No active disease. Small stellate opacity in the right suprahilar lung either representing summation shadow versus small nodule. Suggest short interval two-view radiographic follow-up Electronically Signed   By: Donavan Foil M.D.   On: 08/13/2018 01:18      IMPRESSION AND PLAN:   1.  GI bleed - Patient is receiving 1 unit packed red blood cells - We will continue every 6 hour hemoglobin/hematocrit - Dr. Marius Ditch, gastroenterology, consulted for further evaluation and recommendations - N.p.o. - IV Protonix infusion  2.  Anemia - Iron panel pending - Receiving 1 unit packed red blood cells -Will continue serial hemoglobin hematocrit monitoring - Hematology, Dr. Tasia Catchings, consulted for further evaluation and recommendations  3.  Generalized weakness - We will consult physical therapy for supportive care  4.  History of atrial fibrillation on Xarelto - Telemetry monitoring -Will hold Xarelto pending hematology consultation with current GI bleed and anemia  DVT prophylaxis with SCDs and PPI prophylaxis.    All the records are reviewed and case discussed with ED provider. The plan of care was discussed in details with the patient (and family). I answered all questions. The patient agreed to proceed with  the above mentioned plan. Further management will depend upon hospital course.   CODE STATUS: Full code  TOTAL TIME TAKING CARE OF THIS PATIENT: 45 minutes.    [place] on 08/13/2018 at 2:34 AM  Pager - [phone]  After 6pm go to [email or web address] - Proofreader  [facility]  Office  [phone]  CC: Primary care physician; Elby Beck, FNP   Note: This dictation was prepared with Dragon dictation along with smaller phrase technology. Any transcriptional errors that result from this process are unintentional.

## 2018-08-13 NOTE — Evaluation (Signed)
Physical Therapy Evaluation Patient Details Name: Gabriel Brewer MRN: [id number] DOB: 02-19-1940 Today's Date: 08/13/2018   History of Present Illness  78 y.o. male with a known history of diverticulosis, colon polyps, proximal small bowel AVM, and a history of hemorrhoids, as well as a history of atrial fibrillation currently on Xarelto, hypertension, diabetes.  Additionally, he has a history of iron deficiency anemia and receives iron transfusions at the cancer center followed by Dr. Tasia Catchings.  His last transfusion was received in January 2020.  He presented to the emergency room complaining of a 2-day history of generalized fatigue and malaise having noticed a black stool earlier in the day. Pt s/p transfusion before PT exam.  Clinical Impression  Pt reports feeling much better post transfusion and was able to easily get out of bed and circumambulate the nurses' station, negotiate up/down steps and generally did very well with all functional mobility.  He reports feeling near his baseline and agrees with PT that he will not need further PT intervention.  Will sign off at this time, no needs.      Follow Up Recommendations No PT follow up    Equipment Recommendations  None recommended by PT    Recommendations for Other Services       Precautions / Restrictions Precautions Precautions: None Restrictions Weight Bearing Restrictions: No      Mobility  Bed Mobility Overal bed mobility: Independent                Transfers Overall transfer level: Independent                  Ambulation/Gait Ambulation/Gait assistance: Independent Gait Distance (Feet): 300 Feet Assistive device: None       General Gait Details: Pt able to ambulate with good speed and confidence, no LOBs, no fatigue (O2 99% pre and post effort)   Stairs Stairs: Yes Stairs assistance: Modified independent (Device/Increase time) Stair Management: One rail Right Number of Stairs: 4 General stair comments:  Pt easily able to reciprocally negotiate up/down steps  Wheelchair Mobility    Modified Rankin (Stroke Patients Only)       Balance Overall balance assessment: Independent                                           Pertinent Vitals/Pain Pain Assessment: No/denies pain    Home Living Family/patient expects to be discharged to:: Private residence Living Arrangements: Spouse/significant other Available Help at Discharge: Family Type of Home: House Home Access: Stairs to enter Entrance Stairs-Rails: (yes) Entrance Stairs-Number of Steps: 3 Home Layout: One level        Prior Function Level of Independence: Independent         Comments: "I can do just about anything I want."     Hand Dominance        Extremity/Trunk Assessment   Upper Extremity Assessment Upper Extremity Assessment: Overall WFL for tasks assessed    Lower Extremity Assessment Lower Extremity Assessment: Overall WFL for tasks assessed       Communication   Communication: No difficulties  Cognition Arousal/Alertness: Awake/alert Behavior During Therapy: WFL for tasks assessed/performed Overall Cognitive Status: Within Functional Limits for tasks assessed  General Comments      Exercises     Assessment/Plan    PT Assessment Patent does not need any further PT services  PT Problem List         PT Treatment Interventions      PT Goals (Current goals can be found in the Care Plan section)  Acute Rehab PT Goals Patient Stated Goal: go home PT Goal Formulation: All assessment and education complete, DC therapy    Frequency     Barriers to discharge        Co-evaluation               AM-PAC PT "6 Clicks" Mobility  Outcome Measure Help needed turning from your back to your side while in a flat bed without using bedrails?: None Help needed moving from lying on your back to sitting on the side of a  flat bed without using bedrails?: None Help needed moving to and from a bed to a chair (including a wheelchair)?: None Help needed standing up from a chair using your arms (e.g., wheelchair or bedside chair)?: None Help needed to walk in hospital room?: None Help needed climbing 3-5 steps with a railing? : None 6 Click Score: 24    End of Session Equipment Utilized During Treatment: Gait belt Activity Tolerance: Patient tolerated treatment well Patient left: in chair;with call bell/phone within reach Nurse Communication: Mobility status PT Visit Diagnosis: Muscle weakness (generalized) (M62.81)    Time: 1010-1029 PT Time Calculation (min) (ACUTE ONLY): 19 min   Charges:   PT Evaluation $PT Eval Low Complexity: 1 Low          Kreg Shropshire, DPT 08/13/2018, 1:32 PM

## 2018-08-13 NOTE — ED Notes (Signed)
ED TO INPATIENT HANDOFF REPORT  ED Nurse Name and Phone #: Lorriane Shire [phone]  S Name/Age/Gender Gabriel Brewer 78 y.o. male Room/Bed: ED25A/ED25A  Code Status   Code Status: Full Code  Home/SNF/Other Home Patient oriented to: self, place, time and situation Is this baseline? Yes   Triage Complete: Triage complete  Chief Complaint weakness  Triage Note Pt arrives with concerns over possible anemia. Pt states he currently feels weak and exertionally "winded." Pt states those are typical feelings when he is anemic. Pt denies current pain.    Allergies No Known Allergies  Level of Care/Admitting Diagnosis ED Disposition    ED Disposition Condition [facility] Area: [place] [[id number]]  Level of Care: Med-Surg [16]  Covid Evaluation: Confirmed COVID Negative  Diagnosis: GI bleed [710626]  Admitting Physician: Mayer Camel [[id number]]  Attending Physician: Mayer Camel [[id number]]  Estimated length of stay: past midnight tomorrow  Certification:: I certify this patient will need inpatient services for at least 2 midnights  PT Class (Do Not Modify): Inpatient [101]  PT Acc Code (Do Not Modify): Private [1]       B Medical/Surgery History Past Medical History:  Diagnosis Date  . Cancer ([place]) 2017   skin   . COPD (chronic obstructive pulmonary disease) ([place])   . Diabetes mellitus without complication ([place])   . GERD (gastroesophageal reflux disease)   . Hyperlipidemia   . Hypertension   . Mitral regurgitation   . PAF (paroxysmal atrial fibrillation) ([place])   . Stroke [facility])    Past Surgical History:  Procedure Laterality Date  . CHOLECYSTECTOMY N/A 07/30/2018   Procedure: LAPAROSCOPIC CHOLECYSTECTOMY WITH INTRAOPERATIVE CHOLANGIOGRAM;  Surgeon: Greer Pickerel, MD;  Location: [facility] ORS;  Service: General;  Laterality: N/A;  . COLONOSCOPY WITH PROPOFOL N/A 11/17/2017   Procedure: COLONOSCOPY WITH PROPOFOL;  Surgeon: Jonathon Bellows, MD;   Location: [facility] ENDOSCOPY;  Service: Gastroenterology;  Laterality: N/A;  . ESOPHAGOGASTRODUODENOSCOPY (EGD) WITH PROPOFOL N/A 11/17/2017   Procedure: ESOPHAGOGASTRODUODENOSCOPY (EGD) WITH PROPOFOL;  Surgeon: Jonathon Bellows, MD;  Location: [facility] ENDOSCOPY;  Service: Gastroenterology;  Laterality: N/A;  . GIVENS CAPSULE STUDY N/A 12/09/2017   Procedure: GIVENS CAPSULE STUDY;  Surgeon: Jonathon Bellows, MD;  Location: [facility] ENDOSCOPY;  Service: Gastroenterology;  Laterality: N/A;  . TONSILLECTOMY       A IV Location/Drains/Wounds Patient Lines/Drains/Airways Status   Active Line/Drains/Airways    Name:   Placement date:   Placement time:   Site:   Days:   Peripheral IV 08/13/18 Left Antecubital   08/13/18    0150    Antecubital   less than 1   Incision (Closed) 07/30/18 Abdomen   07/30/18    1450     14   Incision - 4 Ports Abdomen Anterior;Right Anterior;Right;Lower Anterior;Superior Umbilicus   38/18/29    9371     14          Intake/Output Last 24 hours No intake or output data in the 24 hours ending 08/13/18 0303  Labs/Imaging Results for orders placed or performed during the hospital encounter of 08/13/18 (from the past 48 hour(s))  Basic metabolic panel     Status: Abnormal   Collection Time: 08/12/18  9:54 PM  Result Value Ref Range   Sodium 135 135 - 145 mmol/L   Potassium 3.6 3.5 - 5.1 mmol/L   Chloride 103 98 - 111 mmol/L   CO2 22 22 - 32 mmol/L   Glucose, Bld 159 (H) 70 -  99 mg/dL   BUN 40 (H) 8 - 23 mg/dL   Creatinine, Ser 1.27 (H) 0.61 - 1.24 mg/dL   Calcium 8.8 (L) 8.9 - 10.3 mg/dL   GFR calc non Af Amer 54 (L) >60 mL/min   GFR calc Af Amer >60 >60 mL/min   Anion gap 10 5 - 15    Comment: Performed at [facility], [address] [place], [place] [address]  CBC     Status: Abnormal   Collection Time: 08/12/18  9:54 PM  Result Value Ref Range   WBC 12.4 (H) 4.0 - 10.5 K/uL   RBC 3.06 (L) 4.22 - 5.81 MIL/uL   Hemoglobin 9.2 (L) 13.0 - 17.0 g/dL   HCT 27.4 (L)  39.0 - 52.0 %   MCV 89.5 80.0 - 100.0 fL   MCH 30.1 26.0 - 34.0 pg   MCHC 33.6 30.0 - 36.0 g/dL   RDW 15.0 11.5 - 15.5 %   Platelets 296 150 - 400 K/uL   nRBC 0.5 (H) 0.0 - 0.2 %    Comment: Performed at [facility], [place], [place] [address]  Troponin I (High Sensitivity)     Status: None   Collection Time: 08/12/18  9:54 PM  Result Value Ref Range   Troponin I (High Sensitivity) 4 <18 ng/L    Comment: (NOTE) Elevated high sensitivity troponin I (hsTnI) values and significant  changes across serial measurements may suggest ACS but many other  chronic and acute conditions are known to elevate hsTnI results.  Refer to the "Links" section for chest pain algorithms and additional  guidance. Performed at [facility], [address] [place], [place] [address]   Type and screen     Status: None (Preliminary result)   Collection Time: 08/13/18  1:38 AM  Result Value Ref Range   ABO/RH(D) AB POS    Antibody Screen NEG    Sample Expiration 08/16/2018,2359    Unit Number [id number]    Blood Component Type RBC, LR IRR    Unit division 00    Status of Unit ALLOCATED    Transfusion Status OK TO TRANSFUSE    Crossmatch Result      Compatible Performed at [facility], [address] [place], [place] [address]   Prepare RBC     Status: None   Collection Time: 08/13/18  1:38 AM  Result Value Ref Range   Order Confirmation      ORDER PROCESSED BY BLOOD BANK Performed at [facility], [address] [place], [place] [address]    Dg Chest 1 View  Result Date: 08/13/2018 CLINICAL DATA:  Weakness EXAM: CHEST  1 VIEW COMPARISON:  03/13/2018, 11/29/2017, CT 11/05/2016 FINDINGS: Hyperinflation with emphysematous disease. No acute consolidation or pleural effusion. Stable cardiomediastinal silhouette. No pneumothorax. Apically pleural thickening. Small stellate focus in the right suprahilar lung. IMPRESSION: No active disease.  Small stellate opacity in the right suprahilar lung either representing summation shadow versus small nodule. Suggest short interval two-view radiographic follow-up Electronically Signed   By: Donavan Foil M.D.   On: 08/13/2018 01:18    Pending Labs Unresulted Labs (From admission, onward)    Start     Ordered   08/13/18 7673  Basic metabolic panel  Tomorrow morning,   STAT     08/13/18 0200   08/13/18 0500  CBC  Tomorrow morning,   STAT     08/13/18 0200   08/13/18 0500  APTT  Tomorrow morning,   STAT     08/13/18 0200   08/13/18 0500  Protime-INR  Tomorrow morning,   STAT     08/13/18 0200   08/13/18 0251  Iron and TIBC  Once,   STAT     08/13/18 0250   08/13/18 0223  Novel Coronavirus,NAA,(SEND-OUT TO REF LAB - TAT 24-48 hrs); Hosp Order  (Asymptomatic Patients Labs)  Once,   STAT    Question:  Rule Out  Answer:  Yes   08/13/18 0222   08/13/18 0214  Hemoglobin A1c  Once,   STAT    Comments: To assess prior glycemic control    08/13/18 0214   08/13/18 0201  Hemoglobin and hematocrit, blood  Now then every 6 hours,   STAT     08/13/18 0200   08/12/18 2150  Urinalysis, Complete w Microscopic  ONCE - STAT,   STAT     08/12/18 2150          Vitals/Pain Today's Vitals   08/12/18 2148 08/12/18 2150  BP:  118/81  Pulse:  (!) 110  Resp:  20  Temp:  98.1 F (36.7 C)  TempSrc:  Oral  SpO2:  100%  Weight: 76.7 kg   Height: 5\' 11"  (1.803 m)   PainSc: 0-No pain     Isolation Precautions No active isolations  Medications Medications  pantoprazole (PROTONIX) 80 mg in sodium chloride 0.9 % 250 mL (0.32 mg/mL) infusion (8 mg/hr Intravenous New Bag/Given 08/13/18 0216)  pantoprazole (PROTONIX) injection 40 mg (has no administration in time range)  atorvastatin (LIPITOR) tablet 10 mg (has no administration in time range)  benazepril (LOTENSIN) tablet 40 mg (has no administration in time range)  diltiazem (CARDIZEM CD) 24 hr capsule 180 mg (has no administration in time range)   metFORMIN (GLUCOPHAGE) tablet 500 mg (has no administration in time range)  traMADol (ULTRAM) tablet 50 mg (has no administration in time range)  cholecalciferol (VITAMIN D3) tablet 4,000 Units (has no administration in time range)  hydrochlorothiazide (HYDRODIURIL) tablet 12.5 mg (has no administration in time range)  acetaminophen (TYLENOL) tablet 650 mg (has no administration in time range)    Or  acetaminophen (TYLENOL) suppository 650 mg (has no administration in time range)  ondansetron (ZOFRAN) tablet 4 mg (has no administration in time range)    Or  ondansetron (ZOFRAN) injection 4 mg (has no administration in time range)  traZODone (DESYREL) tablet 50 mg (has no administration in time range)  insulin aspart (novoLOG) injection 0-9 Units (has no administration in time range)  insulin aspart (novoLOG) injection 0-5 Units (has no administration in time range)  pantoprazole (PROTONIX) 80 mg in sodium chloride 0.9 % 100 mL IVPB (0 mg Intravenous Stopped 08/13/18 0230)  0.9 %  sodium chloride infusion (10 mL/hr Intravenous New Bag/Given 08/13/18 0159)    Mobility walks Low fall risk      R Recommendations: See Admitting Provider Note  Report given to:   Additional Notes:

## 2018-08-13 NOTE — ED Provider Notes (Signed)
[facility] Emergency Department Provider Note  ____________________________________________  Time seen: Approximately 1:29 AM  I have reviewed the triage vital signs and the nursing notes.   HISTORY  Chief Complaint Weakness   HPI Gabriel Brewer is a 78 y.o. male with a history of hemorrhoids, diverticulosis, polyps, proximal small bowel AVM, A. fib on Xarelto, hypertension, diabetes who presents for evaluation of fatigue.  Patient has a history of iron deficiency anemia and receives iron infusions at the cancer center.  He last received any infusions back in January 2020.  He was doing well until 2 days ago when he started feeling very fatigued.  He try to get a hold of his oncologist today when he was unsuccessful he decided to come to the emergency room for evaluation.  He also noted that his sugar was slightly elevated at home with blood glucose as high as 250.  His baseline is usually between 100-120.  He denies abdominal pain but does endorse having 1 bowel movement today which looked black.  No hematemesis or coffee-ground emesis, no chest pain or shortness of breath, no fever, no hematuria, no hemoptysis.  Past Medical History:  Diagnosis Date   Cancer ([place]) 2017   skin    COPD (chronic obstructive pulmonary disease) ([place])    Diabetes mellitus without complication ([facility])    GERD (gastroesophageal reflux disease)    Hyperlipidemia    Hypertension    Mitral regurgitation    PAF (paroxysmal atrial fibrillation) ([place])    Stroke [facility])     Patient Active Problem List   Diagnosis Date Noted   Iron deficiency anemia 11/14/2017   Elevated prostate specific antigen (PSA) 11/12/2017   Esophageal reflux 11/12/2017   Family history of malignant neoplasm of prostate 11/12/2017   Low back pain 11/12/2017   Vitamin D deficiency 11/12/2017   Pneumonia 11/03/2016   Endocarditis of mitral valve 09/27/2015   Mitral regurgitation 09/27/2015    Essential hypertension 08/18/2015   Skin cancer of face 02/06/2015   Mixed hyperlipidemia 10/22/2014    Past Surgical History:  Procedure Laterality Date   CHOLECYSTECTOMY N/A 07/30/2018   Procedure: LAPAROSCOPIC CHOLECYSTECTOMY WITH INTRAOPERATIVE CHOLANGIOGRAM;  Surgeon: Greer Pickerel, MD;  Location: [facility];  Service: General;  Laterality: N/A;   COLONOSCOPY WITH PROPOFOL N/A 11/17/2017   Procedure: COLONOSCOPY WITH PROPOFOL;  Surgeon: Jonathon Bellows, MD;  Location: [facility] ENDOSCOPY;  Service: Gastroenterology;  Laterality: N/A;   ESOPHAGOGASTRODUODENOSCOPY (EGD) WITH PROPOFOL N/A 11/17/2017   Procedure: ESOPHAGOGASTRODUODENOSCOPY (EGD) WITH PROPOFOL;  Surgeon: Jonathon Bellows, MD;  Location: [facility] ENDOSCOPY;  Service: Gastroenterology;  Laterality: N/A;   GIVENS CAPSULE STUDY N/A 12/09/2017   Procedure: GIVENS CAPSULE STUDY;  Surgeon: Jonathon Bellows, MD;  Location: [facility] ENDOSCOPY;  Service: Gastroenterology;  Laterality: N/A;   TONSILLECTOMY      Prior to Admission medications   Medication Sig Start Date End Date Taking? Authorizing Provider  atorvastatin (LIPITOR) 10 MG tablet Take 1 tablet (10 mg total) by mouth daily. 03/04/18   Elby Beck, FNP  benazepril (LOTENSIN) 40 MG tablet Take 1 tablet (40 mg total) by mouth every evening. Patient taking differently: Take 40 mg by mouth at bedtime.  11/07/16   Gladstone Lighter, MD  Cholecalciferol 4000 units CAPS Take 4,000 Units by mouth every Friday.     [provider]  diltiazem (CARDIZEM CD) 180 MG 24 hr capsule Take 1 capsule (180 mg total) by mouth daily. 05/04/18   Elby Beck, FNP  fluticasone (FLONASE) 50  MCG/ACT nasal spray Place 1 spray into both nostrils daily as needed for allergies.  08/15/17   [provider]  hydrochlorothiazide (HYDRODIURIL) 12.5 MG tablet Take 12.5 mg by mouth daily.  04/21/18   [provider]  metFORMIN (GLUCOPHAGE) 500 MG tablet TAKE 1 TABLET(500 MG) BY MOUTH TWICE  DAILY Patient taking differently: Take 500 mg by mouth 2 (two) times a day.  05/16/18   Elby Beck, FNP  omeprazole (PRILOSEC) 40 MG capsule Take 1 capsule (40 mg total) by mouth daily. 05/04/18   Elby Beck, FNP  rivaroxaban (XARELTO) 20 MG TABS tablet Take 1 tablet (20 mg total) by mouth daily with supper. Patient taking differently: Take 20 mg by mouth at bedtime.  11/07/16   Gladstone Lighter, MD  traMADol (ULTRAM) 50 MG tablet Take 1 tablet (50 mg total) by mouth every 6 (six) hours as needed for severe pain. 07/30/18   Greer Pickerel, MD    Allergies Patient has no known allergies.  Family History  Problem Relation Age of Onset   Cerebral aneurysm Mother    Heart disease Father    COPD Father    Breast cancer Sister    Cancer Brother        unknowsn    Social History Social History   Tobacco Use   Smoking status: Former Smoker    Packs/day: 0.50    Years: 20.00    Pack years: 10.00    Types: Cigarettes    Quit date: 11/20/1982    Years since quitting: 35.7   Smokeless tobacco: Never Used  Substance Use Topics   Alcohol use: No   Drug use: No    Review of Systems  Constitutional: Negative for fever. + fatigue Eyes: Negative for visual changes. ENT: Negative for sore throat. Neck: No neck pain  Cardiovascular: Negative for chest pain. Respiratory: Negative for shortness of breath. Gastrointestinal: Negative for abdominal pain, vomiting or diarrhea. + dark stool Genitourinary: Negative for dysuria. Musculoskeletal: Negative for back pain. Skin: Negative for rash. Neurological: Negative for headaches, weakness or numbness. Psych: No SI or HI  ____________________________________________   PHYSICAL EXAM:  VITAL SIGNS: ED Triage Vitals  Enc Vitals Group     BP 08/12/18 2150 118/81     Pulse Rate 08/12/18 2150 (!) 110     Resp 08/12/18 2150 20     Temp 08/12/18 2150 98.1 F (36.7 C)     Temp Source 08/12/18 2150 Oral     SpO2  08/12/18 2150 100 %     Weight 08/12/18 2148 169 lb (76.7 kg)     Height 08/12/18 2148 5\' 11"  (1.803 m)     Head Circumference --      Peak Flow --      Pain Score 08/12/18 2148 0     Pain Loc --      Pain Edu? --      Excl. in [place]? --     Constitutional: Alert and oriented. Well appearing and in no apparent distress. HEENT:      Head: Normocephalic and atraumatic.         Eyes: Conjunctivae are normal. Sclera is non-icteric.       Mouth/Throat: Mucous membranes are moist.       Neck: Supple with no signs of meningismus. Cardiovascular: Tachycardic with regular rhythm. Respiratory: Normal respiratory effort. Lungs are clear to auscultation bilaterally. No wheezes, crackles, or rhonchi.  Gastrointestinal: Soft, non tender, and non distended with positive bowel sounds.  No rebound or guarding. Genitourinary: No CVA tenderness.  Rectal exam showing melena guaiac positive Musculoskeletal: Nontender with normal range of motion in all extremities. No edema, cyanosis, or erythema of extremities. Neurologic: Normal speech and language. Face is symmetric. Moving all extremities. No gross focal neurologic deficits are appreciated. Skin: Skin is warm, dry and intact. No rash noted. Psychiatric: Mood and affect are normal. Speech and behavior are normal.  ____________________________________________   LABS (all labs ordered are listed, but only abnormal results are displayed)  Labs Reviewed  BASIC METABOLIC PANEL - Abnormal; Notable for the following components:      Result Value   Glucose, Bld 159 (*)    BUN 40 (*)    Creatinine, Ser 1.27 (*)    Calcium 8.8 (*)    GFR calc non Af Amer 54 (*)    All other components within normal limits  CBC - Abnormal; Notable for the following components:   WBC 12.4 (*)    RBC 3.06 (*)    Hemoglobin 9.2 (*)    HCT 27.4 (*)    nRBC 0.5 (*)    All other components within normal limits  URINALYSIS, COMPLETE (UACMP) WITH MICROSCOPIC  TYPE AND SCREEN   PREPARE RBC (CROSSMATCH)  TROPONIN I (HIGH SENSITIVITY)  TROPONIN I (HIGH SENSITIVITY)   ____________________________________________  EKG  ED ECG REPORT I, Rudene Re, the attending physician, personally viewed and interpreted this ECG.  Sinus tachycardia with frequent PVCs, rate of 114, normal intervals, normal axis, no ST elevations or depressions. ____________________________________________  RADIOLOGY  none  ____________________________________________   PROCEDURES  Procedure(s) performed: None Procedures Critical Care performed:  Yes  CRITICAL CARE Performed by: Rudene Re  ?  Total critical care time: 30 min  Critical care time was exclusive of separately billable procedures and treating other patients.  Critical care was necessary to treat or prevent imminent or life-threatening deterioration.  Critical care was time spent personally by me on the following activities: development of treatment plan with patient and/or surrogate as well as nursing, discussions with consultants, evaluation of patient's response to treatment, examination of patient, obtaining history from patient or surrogate, ordering and performing treatments and interventions, ordering and review of laboratory studies, ordering and review of radiographic studies, pulse oximetry and re-evaluation of patient's condition.  ____________________________________________   INITIAL IMPRESSION / ASSESSMENT AND PLAN / ED COURSE  78 y.o. male with a history of hemorrhoids, diverticulosis, polyps, proximal small bowel AVM, A. fib on Xarelto, hypertension, diabetes who presents for evaluation of fatigue x 2 days and concerns of worsening anemia.  Patient slightly tachycardic with a pulse of 110, BP is 118/81, rectal exam showing melena.  Patient is on Xarelto for A. fib.  Hemoglobin has dropped from 13.9-9.2 in 16 days.  Of note patient did undergo cholecystectomy 14 days ago however per review  of surgical's note blood loss was minimal.  Patient has not received an iron infusion in a very long time.  However with active bleeding I do believe patient warrants at least 1 unit of pRBCs.  We will also start patient on Protonix bolus and drip.  Will admit to the hospitalist service.       As part of my medical decision making, I reviewed the following data within the [place] notes reviewed and incorporated, Labs reviewed , EKG interpreted , Old EKG reviewed, Old chart reviewed, Discussed with admitting physician , Notes from prior ED visits and [place] Controlled Substance Database  Patient was evaluated in Emergency Department today for the symptoms described in the history of present illness. Patient was evaluated in the context of the global COVID-19 pandemic, which necessitated consideration that the patient might be at risk for infection with the SARS-CoV-2 virus that causes COVID-19. Institutional protocols and algorithms that pertain to the evaluation of patients at risk for COVID-19 are in a state of rapid change based on information released by regulatory bodies including the CDC and federal and state organizations. These policies and algorithms were followed during the patient's care in the ED.   ____________________________________________   FINAL CLINICAL IMPRESSION(S) / ED DIAGNOSES   Final diagnoses:  Acute blood loss anemia  UGIB (upper gastrointestinal bleed)      NEW MEDICATIONS STARTED DURING THIS VISIT:  ED Discharge Orders    None       Note:  This document was prepared using Dragon voice recognition software and may include unintentional dictation errors.    Rudene Re, MD 08/13/18 [phone]

## 2018-08-13 NOTE — ED Notes (Signed)
Attempted to call report unsuccessful. RN not available.

## 2018-08-13 NOTE — Consult Note (Addendum)
Vonda Antigua, MD [address] [place], [place], [place], [address] [address], [place], [place], [place], [address] Phone: [phone]  Fax: [phone]  Consultation  Referring Provider:     Dr. Bridgett Larsson Primary Care Physician:  Elby Beck, FNP Reason for Consultation:     Melena  Date of Admission:  08/13/2018 Date of Consultation:  08/13/2018         HPI:   Gabriel Brewer is a 78 y.o. male presents with 1 day history of melena with recent history of cholecystectomy 2 weeks ago.  Patient reports taking Motrin for 3 days post surgery.  No nausea or vomiting.  States he only looks at his bowel movements for the first time yesterday.  Had one black bowel movement yesterday and 3 smaller black bowel movements today.  No red blood in stool.  He is on Xarelto and last dose was yesterday evening.  Has previously been evaluated by [name] for anemia. As per his clinic note "11/17/17 : EGD : 3 cm hiatal hernia. Colonoscopy 15 cc cecal polyp resected. (tubulovillous adenoma)"  Also underwent small bowel capsule endoscopy which showed a nonbleeding AVM and push enteroscopy was recommended in Dr. Georgeann Oppenheim note.  However, this was never done.   Past Medical History:  Diagnosis Date  . Cancer ([place]) 2017   skin   . COPD (chronic obstructive pulmonary disease) ([place])   . Diabetes mellitus without complication ([place])   . GERD (gastroesophageal reflux disease)   . Hyperlipidemia   . Hypertension   . Mitral regurgitation   . PAF (paroxysmal atrial fibrillation) ([place])   . Stroke [facility])     Past Surgical History:  Procedure Laterality Date  . CHOLECYSTECTOMY N/A 07/30/2018   Procedure: LAPAROSCOPIC CHOLECYSTECTOMY WITH INTRAOPERATIVE CHOLANGIOGRAM;  Surgeon: Greer Pickerel, MD;  Location: [facility] ORS;  Service: General;  Laterality: N/A;  . COLONOSCOPY WITH PROPOFOL N/A 11/17/2017   Procedure: COLONOSCOPY WITH PROPOFOL;  Surgeon: Jonathon Bellows, MD;  Location: [facility] ENDOSCOPY;  Service:  Gastroenterology;  Laterality: N/A;  . ESOPHAGOGASTRODUODENOSCOPY (EGD) WITH PROPOFOL N/A 11/17/2017   Procedure: ESOPHAGOGASTRODUODENOSCOPY (EGD) WITH PROPOFOL;  Surgeon: Jonathon Bellows, MD;  Location: [facility] ENDOSCOPY;  Service: Gastroenterology;  Laterality: N/A;  . GIVENS CAPSULE STUDY N/A 12/09/2017   Procedure: GIVENS CAPSULE STUDY;  Surgeon: Jonathon Bellows, MD;  Location: [facility] ENDOSCOPY;  Service: Gastroenterology;  Laterality: N/A;  . TONSILLECTOMY      Prior to Admission medications   Medication Sig Start Date End Date Taking? Authorizing Provider  amoxicillin (AMOXIL) 500 MG capsule Take 500 mg by mouth. Take as needed before dental appointments. 07/14/18  Yes [provider]  atorvastatin (LIPITOR) 10 MG tablet Take 1 tablet (10 mg total) by mouth daily. 03/04/18  Yes Elby Beck, FNP  benazepril (LOTENSIN) 40 MG tablet Take 1 tablet (40 mg total) by mouth every evening. Patient taking differently: Take 40 mg by mouth at bedtime.  11/07/16  Yes Gladstone Lighter, MD  Cholecalciferol 4000 units CAPS Take 4,000 Units by mouth every Friday.    Yes [provider]  diltiazem (CARDIZEM CD) 180 MG 24 hr capsule Take 1 capsule (180 mg total) by mouth daily. 05/04/18  Yes Elby Beck, FNP  fluticasone (FLONASE) 50 MCG/ACT nasal spray Place 1 spray into both nostrils daily as needed for allergies.  08/15/17  Yes [provider]  hydrochlorothiazide (HYDRODIURIL) 12.5 MG tablet Take 12.5 mg by mouth daily.  04/21/18  Yes [provider]  metFORMIN (GLUCOPHAGE)  500 MG tablet TAKE 1 TABLET(500 MG) BY MOUTH TWICE DAILY Patient taking differently: Take 500 mg by mouth 2 (two) times a day.  05/16/18  Yes Elby Beck, FNP  omeprazole (PRILOSEC) 40 MG capsule Take 1 capsule (40 mg total) by mouth daily. 05/04/18  Yes Elby Beck, FNP  rivaroxaban (XARELTO) 20 MG TABS tablet Take 1 tablet (20 mg total) by mouth daily with supper. Patient taking  differently: Take 20 mg by mouth at bedtime.  11/07/16  Yes Gladstone Lighter, MD    Family History  Problem Relation Age of Onset  . Cerebral aneurysm Mother   . Heart disease Father   . COPD Father   . Breast cancer Sister   . Cancer Brother        unknowsn     Social History   Tobacco Use  . Smoking status: Former Smoker    Packs/day: 0.50    Years: 20.00    Pack years: 10.00    Types: Cigarettes    Quit date: 11/20/1982    Years since quitting: 35.7  . Smokeless tobacco: Never Used  Substance Use Topics  . Alcohol use: No  . Drug use: No    Allergies as of 08/12/2018  . (No Known Allergies)    Review of Systems:    All systems reviewed and negative except where noted in HPI.   Physical Exam:  Vital signs in last 24 hours: Vitals:   08/13/18 0530 08/13/18 0532 08/13/18 0823 08/13/18 1634  BP: 114/74 114/63 121/72 122/65  Pulse: 82 79 91 81  Resp: 16  18 18   Temp: 98.3 F (36.8 C) 98.3 F (36.8 C) (!) 97.5 F (36.4 C)   TempSrc: Oral Oral Oral   SpO2: 97% 97% 100% 100%  Weight:      Height:       Last BM Date: 08/13/18 General:   Pleasant, cooperative in NAD Head:  Normocephalic and atraumatic. Eyes:   No icterus.   Conjunctiva pink. PERRLA. Ears:  Normal auditory acuity. Neck:  Supple; no masses or thyroidomegaly Lungs: Respirations even and unlabored. Lungs clear to auscultation bilaterally.   No wheezes, crackles, or rhonchi.  Abdomen:  Soft, nondistended, nontender. Normal bowel sounds. No appreciable masses or hepatomegaly.  No rebound or guarding.  Neurologic:  Alert and oriented x3;  grossly normal neurologically. Skin:  Intact without significant lesions or rashes. Cervical Nodes:  No significant cervical adenopathy. Psych:  Alert and cooperative. Normal affect.  LAB RESULTS: Recent Labs    08/12/18 2154 08/13/18 0402 08/13/18 0758 08/13/18 1348  WBC 12.4* 9.8  --   --   HGB 9.2* 7.8* 9.2* 9.3*  HCT 27.4* 23.8* 27.9* 27.3*  PLT 296  214  --   --    BMET Recent Labs    08/12/18 2154 08/13/18 0402 08/13/18 1348  NA 135 138 138  K 3.6 4.1 3.9  CL 103 108 109  CO2 22 22 21*  GLUCOSE 159* 132* 123*  BUN 40* 36* 26*  CREATININE 1.27* 1.06 1.04  CALCIUM 8.8* 8.3* 8.5*   LFT Recent Labs    08/13/18 1348  PROT 5.9*  ALBUMIN 3.6  AST 17  ALT 16  ALKPHOS 53  BILITOT 1.1   PT/INR Recent Labs    08/13/18 0402  LABPROT 34.4*  INR 3.5*    STUDIES: Dg Chest 1 View  Result Date: 08/13/2018 CLINICAL DATA:  Weakness EXAM: CHEST  1 VIEW COMPARISON:  03/13/2018, 11/29/2017, CT 11/05/2016  FINDINGS: Hyperinflation with emphysematous disease. No acute consolidation or pleural effusion. Stable cardiomediastinal silhouette. No pneumothorax. Apically pleural thickening. Small stellate focus in the right suprahilar lung. IMPRESSION: No active disease. Small stellate opacity in the right suprahilar lung either representing summation shadow versus small nodule. Suggest short interval two-view radiographic follow-up Electronically Signed   By: Donavan Foil M.D.   On: 08/13/2018 01:18      Impression / Plan:   Gabriel Brewer is a 78 y.o. y/o male with recent cholecystectomy 2 weeks ago, admitted with melena and anemia, with previous history of iron deficiency anemia and nonbleeding AVMs seen in small bowel capsule endoscopy in December 2019  Possible source of melena as patient is known AVM in the setting of Xarelto use  However, he is recently post cholecystectomy  As I have ordered a CT scan to rule out any abnormalities postsurgically  If no abnormalities can present, patient can proceed with endoscopy for further evaluation of symptoms  Usually if one AVM is seen on the study, other AVMs can be present in the small bowel as well.  Therefore iron replacement becomes important.  Patient receives this via hematology clinic.  PPI IV twice daily  Continue serial CBCs and transfuse PRN Avoid NSAIDs Maintain 2 large-bore  IV lines Please page GI with any acute hemodynamic changes, or signs of active GI bleeding   Thank you for involving me in the care of this patient.      LOS: 0 days   Virgel Manifold, MD  08/13/2018, 5:22 PM

## 2018-08-13 NOTE — Consult Note (Signed)
Hematology/Oncology Consult note [facility] [phone] [phone]  Patient Care Team: Elby Beck, FNP as PCP - General (Nurse Practitioner)   Name of the patient: Gabriel Brewer  [id number]  03/04/1940   Date of visit: 08/13/18 REASON FOR COSULTATION:  Anemia.  History of presenting illness-  78 y.o. male with PMH listed at below including history of iron deficiency anemia, small bowel AVM, atrial fibrillation on Xarelto, HTN, DM, who presents to ER for evaluation of progressively worsened weakness and fatigue.  He also noticed black stool.  Denies any abdominal pain, shortness of breath.  No chest pain, fever, chills, nausea, vomiting. Blood work done 2 weeks ago 07/27/2018 which showed hemoglobin of 13.9. In the emergency room, hemoglobin was found to be 9.2, further decreased to 7.8. Received 1 unit of PRBC in the emergency room. Heme-onc was consulted for anemia. Recent cholecystectomy, had taken couple of days of Motrin post surgery. # Patient previously had endoscopy and colonoscopy done which showed nonbleeding hemorrhoids, diverticulosis, polyps, hiatal hernia otherwise normal.  Patient also had capsule study on 12/17/2017 with findings of small proximal small bowel AVM nonbleeding.  Patient has not had a ablation done yet  Review of Systems  Constitutional: Positive for fatigue. Negative for appetite change, chills, fever and unexpected weight change.  HENT:   Negative for hearing loss and voice change.   Eyes: Negative for eye problems and icterus.  Respiratory: Negative for chest tightness, cough and shortness of breath.   Cardiovascular: Negative for chest pain and leg swelling.  Gastrointestinal: Positive for blood in stool. Negative for abdominal distention and abdominal pain.  Endocrine: Negative for hot flashes.  Genitourinary: Negative for difficulty urinating, dysuria and frequency.   Musculoskeletal: Negative for  arthralgias.  Skin: Negative for itching and rash.  Neurological: Negative for light-headedness and numbness.  Hematological: Negative for adenopathy. Does not bruise/bleed easily.  Psychiatric/Behavioral: Negative for confusion.    No Known Allergies  Patient Active Problem List   Diagnosis Date Noted   GI bleed 08/13/2018   Iron deficiency anemia 11/14/2017   Elevated prostate specific antigen (PSA) 11/12/2017   Esophageal reflux 11/12/2017   Family history of malignant neoplasm of prostate 11/12/2017   Low back pain 11/12/2017   Vitamin D deficiency 11/12/2017   Pneumonia 11/03/2016   Endocarditis of mitral valve 09/27/2015   Mitral regurgitation 09/27/2015   Essential hypertension 08/18/2015   Skin cancer of face 02/06/2015   Mixed hyperlipidemia 10/22/2014     Past Medical History:  Diagnosis Date   Cancer ([place]) 2017   skin    COPD (chronic obstructive pulmonary disease) ([place])    Diabetes mellitus without complication ([facility])    GERD (gastroesophageal reflux disease)    Hyperlipidemia    Hypertension    Mitral regurgitation    PAF (paroxysmal atrial fibrillation) ([place])    Stroke [facility])      Past Surgical History:  Procedure Laterality Date   CHOLECYSTECTOMY N/A 07/30/2018   Procedure: LAPAROSCOPIC CHOLECYSTECTOMY WITH INTRAOPERATIVE CHOLANGIOGRAM;  Surgeon: Greer Pickerel, MD;  Location: [facility];  Service: General;  Laterality: N/A;   COLONOSCOPY WITH PROPOFOL N/A 11/17/2017   Procedure: COLONOSCOPY WITH PROPOFOL;  Surgeon: Jonathon Bellows, MD;  Location: [facility] ENDOSCOPY;  Service: Gastroenterology;  Laterality: N/A;   ESOPHAGOGASTRODUODENOSCOPY (EGD) WITH PROPOFOL N/A 11/17/2017   Procedure: ESOPHAGOGASTRODUODENOSCOPY (EGD) WITH PROPOFOL;  Surgeon: Jonathon Bellows, MD;  Location: [facility] ENDOSCOPY;  Service: Gastroenterology;  Laterality: N/A;   GIVENS CAPSULE STUDY N/A 12/09/2017  Procedure: GIVENS CAPSULE STUDY;  Surgeon: Jonathon Bellows, MD;  Location:  [facility] ENDOSCOPY;  Service: Gastroenterology;  Laterality: N/A;   TONSILLECTOMY      Social History   Socioeconomic History   Marital status: Married    Spouse name: Not on file   Number of children: Not on file   Years of education: Not on file   Highest education level: Not on file  Occupational History   Occupation: retired  Scientist, product/process development strain: Not on file   Food insecurity    Worry: Not on file    Inability: Not on Lexicographer needs    Medical: Not on file    Non-medical: Not on file  Tobacco Use   Smoking status: Former Smoker    Packs/day: 0.50    Years: 20.00    Pack years: 10.00    Types: Cigarettes    Quit date: 11/20/1982    Years since quitting: 35.7   Smokeless tobacco: Never Used  Substance and Sexual Activity   Alcohol use: No   Drug use: No   Sexual activity: Not Currently    Partners: Female  Lifestyle   Physical activity    Days per week: Not on file    Minutes per session: Not on file   Stress: Not on file  Relationships   Social connections    Talks on phone: Not on file    Gets together: Not on file    Attends religious service: Not on file    Active member of club or organization: Not on file    Attends meetings of clubs or organizations: Not on file    Relationship status: Not on file   Intimate partner violence    Fear of current or ex partner: Not on file    Emotionally abused: Not on file    Physically abused: Not on file    Forced sexual activity: Not on file  Other Topics Concern   Not on file  Social History Narrative   Not on file     Family History  Problem Relation Age of Onset   Cerebral aneurysm Mother    Heart disease Father    COPD Father    Breast cancer Sister    Cancer Brother        unknowsn     Current Facility-Administered Medications:    acetaminophen (TYLENOL) tablet 650 mg, 650 mg, Oral, Q6H PRN **OR** acetaminophen (TYLENOL) suppository 650 mg,  650 mg, Rectal, Q6H PRN, Seals, Angela H, NP   atorvastatin (LIPITOR) tablet 10 mg, 10 mg, Oral, Daily, Seals, Angela H, NP   [START ON 08/14/2018] cholecalciferol (VITAMIN D3) tablet 4,000 Units, 4,000 Units, Oral, Q Fri, Seals, Angela H, NP   diltiazem (CARDIZEM CD) 24 hr capsule 180 mg, 180 mg, Oral, Daily, Seals, Angela H, NP, 180 mg at 08/13/18 1100   insulin aspart (novoLOG) injection 0-5 Units, 0-5 Units, Subcutaneous, QHS, Seals, Angela H, NP   insulin aspart (novoLOG) injection 0-9 Units, 0-9 Units, Subcutaneous, TID WC, Seals, Angela H, NP   ondansetron (ZOFRAN) tablet 4 mg, 4 mg, Oral, Q6H PRN **OR** ondansetron (ZOFRAN) injection 4 mg, 4 mg, Intravenous, Q6H PRN, Seals, Angela H, NP   pantoprazole (PROTONIX) 80 mg in sodium chloride 0.9 % 250 mL (0.32 mg/mL) infusion, 8 mg/hr, Intravenous, Continuous, Veronese, [place], MD, Last Rate: 25 mL/hr at 08/13/18 2015, 8 mg/hr at 08/13/18 2015   [START ON 08/16/2018] pantoprazole (PROTONIX) injection 40 mg,  40 mg, Intravenous, Q12H, Veronese, [place], MD   traMADol Veatrice Bourbon) tablet 50 mg, 50 mg, Oral, Q6H PRN, Demetrios Loll, MD   traZODone (DESYREL) tablet 50 mg, 50 mg, Oral, QHS PRN, Mayer Camel, NP   Physical exam: Vitals:   08/13/18 0532 08/13/18 0823 08/13/18 1634 08/13/18 2021  BP: 114/63 121/72 122/65 129/79  Pulse: 79 91 81 83  Resp:  18 18 18   Temp: 98.3 F (36.8 C) (!) 97.5 F (36.4 C)  98.1 F (36.7 C)  TempSrc: Oral Oral  Oral  SpO2: 97% 100% 100% 100%  Weight:      Height:       Physical Exam  Constitutional: He is oriented to person, place, and time. No distress.  HENT:  Head: Normocephalic and atraumatic.  Mouth/Throat: No oropharyngeal exudate.  Eyes: Pupils are equal, round, and reactive to light. EOM are normal. No scleral icterus.  Neck: Normal range of motion. Neck supple.  Cardiovascular: Normal rate.  No murmur heard. Pulmonary/Chest: Effort normal. No respiratory distress. He has no rales. He  exhibits no tenderness.  Abdominal: Soft. He exhibits no distension. There is no abdominal tenderness.  Musculoskeletal: Normal range of motion.        General: No edema.  Neurological: He is alert and oriented to person, place, and time. No cranial nerve deficit. He exhibits normal muscle tone. Coordination normal.  Skin: Skin is warm and dry. He is not diaphoretic. No erythema.  Psychiatric: Affect normal.        CMP Latest Ref Rng & Units 08/13/2018  Glucose 70 - 99 mg/dL 123(H)  BUN 8 - 23 mg/dL 26(H)  Creatinine 0.61 - 1.24 mg/dL 1.04  Sodium 135 - 145 mmol/L 138  Potassium 3.5 - 5.1 mmol/L 3.9  Chloride 98 - 111 mmol/L 109  CO2 22 - 32 mmol/L 21(L)  Calcium 8.9 - 10.3 mg/dL 8.5(L)  Total Protein 6.5 - 8.1 g/dL 5.9(L)  Total Bilirubin 0.3 - 1.2 mg/dL 1.1  Alkaline Phos 38 - 126 U/L 53  AST 15 - 41 U/L 17  ALT 0 - 44 U/L 16   CBC Latest Ref Rng & Units 08/13/2018  WBC 4.0 - 10.5 K/uL -  Hemoglobin 13.0 - 17.0 g/dL 9.0(L)  Hematocrit 39.0 - 52.0 % 26.4(L)  Platelets 150 - 400 K/uL -   RADIOGRAPHIC STUDIES: I have personally reviewed the radiological images as listed and agreed with the findings in the report.  Dg Chest 1 View  Result Date: 08/13/2018 CLINICAL DATA:  Weakness EXAM: CHEST  1 VIEW COMPARISON:  03/13/2018, 11/29/2017, CT 11/05/2016 FINDINGS: Hyperinflation with emphysematous disease. No acute consolidation or pleural effusion. Stable cardiomediastinal silhouette. No pneumothorax. Apically pleural thickening. Small stellate focus in the right suprahilar lung. IMPRESSION: No active disease. Small stellate opacity in the right suprahilar lung either representing summation shadow versus small nodule. Suggest short interval two-view radiographic follow-up Electronically Signed   By: Donavan Foil M.D.   On: 08/13/2018 01:18   Dg Cholangiogram Operative  Result Date: 07/30/2018 CLINICAL DATA:  Intraoperative cholangiogram during laparoscopic cholecystectomy. EXAM:  INTRAOPERATIVE CHOLANGIOGRAM FLUOROSCOPY TIME:  10 seconds (2.34 mGy) COMPARISON:  Abdominal ultrasound-03/16/2018 FINDINGS: Intraoperative cholangiographic images of the right upper abdominal quadrant during laparoscopic cholecystectomy are provided for review. Surgical clips overlie the expected location of the gallbladder fossa. Contrast injection demonstrates selective cannulation of the central aspect of the cystic duct. There is passage of contrast through the central aspect of the cystic duct with filling of a non  dilated common bile duct. There is passage of contrast though the CBD and into the descending portion of the duodenum. There is minimal reflux of injected contrast into the common hepatic duct and central aspect of the non dilated intrahepatic biliary system. Transient mobile filling defect within the common hepatic duct is favored to represent an air bubble. There are no discrete filling defects within the opacified portions of the biliary system to suggest the presence of choledocholithiasis. IMPRESSION: No definite evidence of choledocholithiasis. Electronically Signed   By: Sandi Mariscal M.D.   On: 07/30/2018 15:50   Ct Abdomen Pelvis W Contrast  Result Date: 08/13/2018 CLINICAL DATA:  Per MD note: 78 y.o. y/o male with recent cholecystectomy 2 weeks ago, admitted with melena and anemia, with previous history of iron deficiency anemia and non bleeding AVMs seen in small bowel capsule endoscopy in December 2019. Patient on Xarelto. EXAM: CT ABDOMEN AND PELVIS WITH CONTRAST TECHNIQUE: Multidetector CT imaging of the abdomen and pelvis was performed using the standard protocol following bolus administration of intravenous contrast. CONTRAST:  139mL OMNIPAQUE IOHEXOL 300 MG/ML  SOLN COMPARISON:  CT of the chest, abdomen, pelvis on 09/16/2015 FINDINGS: Lower chest: Minimal LEFT LOWER lobe atelectasis. There is coronary artery atherosclerosis. Heart size is normal. Hepatobiliary: No focal liver  abnormality is seen. Status post cholecystectomy. There is persistent fluid collection in the gallbladder fossa, measuring 2.1 x 5.5 centimeters. Pancreas: Unremarkable. No pancreatic ductal dilatation or surrounding inflammatory changes. Spleen: A 7 millimeter hypodense lesion is identified in the MEDIAL spleen. A 3 millimeter hyperdense lesion is identified in the LATERAL spleen. A 4 millimeter nodule is identified in the LOWER aspect of the spleen. Spleen is normal in size. Adrenals/Urinary Tract: Adrenal glands are normal. A 1.4 centimeter low-attenuation lesion within the midpole of the RIGHT kidney is 54 Hounsfield units, indeterminate for cyst. No hydronephrosis. Ureters are unremarkable. Prominent impression of the prostate upon the urinary bladder. Otherwise the bladder is unremarkable. Stomach/Bowel: Moderate hiatal hernia. Stomach is otherwise normal in appearance. Small bowel loops are unremarkable. There are innumerable colonic diverticula, particularly involving the sigmoid segment where there is hypertrophy of the wall. No evidence for acute diverticulitis. The appendix is well seen and has a normal appearance. Vascular/Lymphatic: There is atherosclerotic calcification of the abdominal aorta. No associated aneurysm. No retroperitoneal or mesenteric adenopathy. Reproductive: Enlarged prostate. Other: Edema identified in the region of the umbilicus, likely indicating recent laparoscopy. No ascites. Musculoskeletal: No suspicious lytic or blastic lesions. Degenerative changes in the lumbar spine. Significant disc height loss and uncovertebral spurring at L2-3. IMPRESSION: 1. Status post cholecystectomy. Persistent fluid collection in the gallbladder fossa, measuring 2.1 x 5.5 cm. Findings may be related to postoperative change. Early infection is not excluded. The absence of ascites makes a bile leak less likely. 2. Small splenic lesions, too small to characterize. 3. Moderate hiatal hernia. 4. Coronary  artery disease. 5. Enlarged prostate gland. 6. Significant diverticular disease without evidence for acute diverticulitis. 7. Degenerative changes in the lumbar spine. 8. Aortic Atherosclerosis (ICD10-I70.0). Electronically Signed   By: Nolon Nations M.D.   On: 08/13/2018 19:32    Assessment and plan- Patient is a 78 y.o. male with history of iron deficiency, small bowel AVM, recent cholecystectomy, presented to emergency room for evaluation of worsening weakness and malaise, dark stool.  #Severe iron deficiency anemia Labs are reviewed and discussed with patient. Agree with PRBC transfusion to keep hemoglobin above 7. Will start patient on IV Venofer. Source of bleeding  most likely from GI. Close monitor hemoglobin daily.  Previous history of small bowel AVM, recent cholecystectomy, post operation use of NSAIDs, chronic anticoagulation for A. fib. I agree with Dr. Bonna Gains in obtaining CT scan to rule out any potential postsurgical bleeding.    Patient will have IV Venofer during admission can also follow-up with me outpatient for additional IV iron treatments.   Thank you for allowing me to participate in the care of this patient.   Earlie Server, MD, PhD Hematology Oncology [facility] Pager- [phone] 08/13/2018

## 2018-08-13 NOTE — Plan of Care (Signed)
No stools noted this shift.  Blood transfusion started as ordered.

## 2018-08-13 NOTE — TOC Initial Note (Signed)
Transition of Care [facility]) - Initial/Assessment Note    Patient Details  Name: Gabriel Brewer MRN: [id number] Date of Birth: 11/03/1940  Transition of Care [facility]) CM/SW Contact:    Latanya Maudlin, RN Phone Number: 08/13/2018, 8:51 AM  Clinical Narrative:  [facility] team consulted to complete assessment for dispostion. Per patient he lives at home with his wife. He is independent with activities of daily living. He still drives. Patient requires the use of no DME. 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 Patient uses [employer] and as of this month had to pay full price for his Xarelto which is close to $350. I have provided coupons once he is set to resume medication. Patients medical barrier is his anemia. He reports he goes through these "episodes" a couple of times a year and receives iron treatments at the Cancer center. Patient reports no other needs.                  Expected Discharge Plan: Home/Self Care Barriers to Discharge: Continued Medical Work up   Patient Goals and CMS Choice Patient states their goals for this hospitalization and ongoing recovery are:: to get rid of these spells      Expected Discharge Plan and Services Expected Discharge Plan: Home/Self Care In-house Referral: Clinical Social Work Discharge Planning Services: CM Consult, Medication Assistance   Living arrangements for the past 2 months: Single Family Home                                      Prior Living Arrangements/Services Living arrangements for the past 2 months: Single Family Home Lives with:: Spouse Patient language and need for interpreter reviewed:: Yes Do you feel safe going back to the place where you live?: Yes      Need for Family Participation in Patient Care: No (Comment)     Criminal Activity/Legal Involvement Pertinent to Current Situation/Hospitalization: No - Comment as needed  Activities of Daily Living Home Assistive Devices/Equipment: Eyeglasses, Hand-held shower hose ADL Screening  (condition at time of admission) Patient's cognitive ability adequate to safely complete daily activities?: Yes Is the patient deaf or have difficulty hearing?: No Does the patient have difficulty seeing, even when wearing glasses/contacts?: No Does the patient have difficulty concentrating, remembering, or making decisions?: No Patient able to express need for assistance with ADLs?: Yes Does the patient have difficulty dressing or bathing?: No Independently performs ADLs?: Yes (appropriate for developmental age) Does the patient have difficulty walking or climbing stairs?: No Weakness of Legs: None Weakness of Arms/Hands: None  Permission Sought/Granted                  Emotional Assessment Appearance:: Appears stated age Attitude/Demeanor/Rapport: Ambitious, Engaged Affect (typically observed): Accepting Orientation: : Oriented to Self, Oriented to Place, Oriented to  Time, Oriented to Situation      Admission diagnosis:  Acute blood loss anemia [D62] Weakness [R53.1] UGIB (upper gastrointestinal bleed) [K92.2] Patient Active Problem List   Diagnosis Date Noted  . GI bleed 08/13/2018  . Iron deficiency anemia 11/14/2017  . Elevated prostate specific antigen (PSA) 11/12/2017  . Esophageal reflux 11/12/2017  . Family history of malignant neoplasm of prostate 11/12/2017  . Low back pain 11/12/2017  . Vitamin D deficiency 11/12/2017  . Pneumonia 11/03/2016  . Endocarditis of mitral valve 09/27/2015  . Mitral regurgitation 09/27/2015  . Essential hypertension 08/18/2015  . Skin cancer of face  02/06/2015  . Mixed hyperlipidemia 10/22/2014   PCP:  Elby Beck, FNP Pharmacy:   [facility] [phone], [place] [address] Phone: [phone] Fax: [phone]     Social Determinants of Health ([place]) Interventions    Readmission Risk Interventions Readmission  Risk Prevention Plan 08/13/2018  Transportation Screening Complete  PCP or Specialist Appt within 5-7 Days Complete  Home Care Screening Complete  Medication Review (RN CM) Complete  Some recent data might be hidden

## 2018-08-14 ENCOUNTER — Inpatient Hospital Stay: Payer: Medicare Other | Admitting: Anesthesiology

## 2018-08-14 ENCOUNTER — Encounter
Admission: EM | Disposition: A | Discharge status: Home / Self Care | Payer: Self-pay | Source: Home / Self Care | Attending: Internal Medicine

## 2018-08-14 DIAGNOSIS — K921 Melena: Secondary | ICD-10-CM

## 2018-08-14 DIAGNOSIS — K922 Gastrointestinal hemorrhage, unspecified: Secondary | ICD-10-CM

## 2018-08-14 DIAGNOSIS — K31811 Angiodysplasia of stomach and duodenum with bleeding: Secondary | ICD-10-CM

## 2018-08-14 HISTORY — PX: ENTEROSCOPY: SHX5533

## 2018-08-14 LAB — PROTIME-INR
INR: 1.3 — ABNORMAL HIGH (ref 0.8–1.2)
Prothrombin Time: 16.3 seconds — ABNORMAL HIGH (ref 11.4–15.2)

## 2018-08-14 LAB — TYPE AND SCREEN
ABO/RH(D): AB POS
Antibody Screen: NEGATIVE
Unit division: 0

## 2018-08-14 LAB — BPAM RBC
Blood Product Expiration Date: 202008022359
ISSUE DATE / TIME: 202007300501
Unit Type and Rh: 600

## 2018-08-14 LAB — HEMOGLOBIN: Hemoglobin: 8.5 g/dL — ABNORMAL LOW (ref 13.0–17.0)

## 2018-08-14 LAB — NOVEL CORONAVIRUS, NAA (HOSP ORDER, SEND-OUT TO REF LAB; TAT 18-24 HRS): SARS-CoV-2, NAA: NOT DETECTED

## 2018-08-14 LAB — GLUCOSE, CAPILLARY
Glucose-Capillary: 99 mg/dL (ref 70–99)
Glucose-Capillary: 95 mg/dL (ref 70–99)
Glucose-Capillary: 106 mg/dL — ABNORMAL HIGH (ref 70–99)
Glucose-Capillary: 86 mg/dL (ref 70–99)

## 2018-08-14 LAB — SARS CORONAVIRUS 2 BY RT PCR (HOSPITAL ORDER, PERFORMED IN ~~LOC~~ HOSPITAL LAB): SARS Coronavirus 2: NEGATIVE

## 2018-08-14 SURGERY — ENTEROSCOPY
Anesthesia: General

## 2018-08-14 MED ORDER — SODIUM CHLORIDE 0.9 % IV SOLN
INTRAVENOUS | Status: DC
  Administered 2018-08-14: 14:00:00 via INTRAVENOUS

## 2018-08-14 MED ORDER — PHENYLEPHRINE HCL (PRESSORS) 10 MG/ML IV SOLN
INTRAVENOUS | Status: DC | PRN
  Administered 2018-08-14: 100 ug via INTRAVENOUS

## 2018-08-14 MED ORDER — PROPOFOL 10 MG/ML IV BOLUS
INTRAVENOUS | Status: DC | PRN
  Administered 2018-08-14: 60 mg via INTRAVENOUS

## 2018-08-14 MED ORDER — SODIUM CHLORIDE 0.9 % IV SOLN
INTRAVENOUS | Status: AC
  Administered 2018-08-14 – 2018-08-15 (×2): via INTRAVENOUS

## 2018-08-14 MED ORDER — PROPOFOL 500 MG/50ML IV EMUL
INTRAVENOUS | Status: DC | PRN
  Administered 2018-08-14: 120 ug/kg/min via INTRAVENOUS

## 2018-08-14 NOTE — Anesthesia Postprocedure Evaluation (Signed)
Anesthesia Post Note  Patient: Gabriel Brewer  Procedure(s) Performed: ENTEROSCOPY (N/A )  Patient location during evaluation: Endoscopy Anesthesia Type: General Level of consciousness: awake and alert Pain management: pain level controlled Vital Signs Assessment: post-procedure vital signs reviewed and stable Respiratory status: spontaneous breathing, nonlabored ventilation, respiratory function stable and patient connected to nasal cannula oxygen Cardiovascular status: blood pressure returned to baseline and stable Postop Assessment: no apparent nausea or vomiting Anesthetic complications: no     Last Vitals:  Vitals:   08/14/18 1440 08/14/18 1450  BP: 122/64 122/64  Pulse: 76 76  Resp: 20 16  Temp:    SpO2: 98% 97%    Last Pain:  Vitals:   08/14/18 1450  TempSrc:   PainSc: 0-No pain                 Precious Haws Bensyn Bornemann

## 2018-08-14 NOTE — Transfer of Care (Signed)
Immediate Anesthesia Transfer of Care Note  Patient: Gabriel Brewer  Procedure(s) Performed: ENTEROSCOPY (N/A )  Patient Location: PACU  Anesthesia Type:General  Level of Consciousness: sedated  Airway & Oxygen Therapy: Patient Spontanous Breathing and Patient connected to face mask oxygen  Post-op Assessment: Report given to RN and Post -op Vital signs reviewed and stable  Post vital signs: Reviewed and stable  Last Vitals:  Vitals Value Taken Time  BP 109/49 08/14/18 1421  Temp 36.4 C 08/14/18 1421  Pulse 73 08/14/18 1421  Resp 15 08/14/18 1421  SpO2 98 % 08/14/18 1421  Vitals shown include unvalidated device data.  Last Pain:  Vitals:   08/14/18 1421  TempSrc:   PainSc: 0-No pain         Complications: No apparent anesthesia complications

## 2018-08-14 NOTE — Anesthesia Preprocedure Evaluation (Signed)
Anesthesia Evaluation  Patient identified by MRN, date of birth, ID band Patient awake    Reviewed: Allergy & Precautions, NPO status , Patient's Chart, lab work & pertinent test results  History of Anesthesia Complications Negative for: history of anesthetic complications  Airway Mallampati: II  TM Distance: >3 FB Neck ROM: Full    Dental  (+) Missing   Pulmonary neg sleep apnea, COPD, former smoker,    breath sounds clear to auscultation- rhonchi (-) wheezing      Cardiovascular hypertension, Pt. on medications (-) CAD, (-) Past MI, (-) Cardiac Stents and (-) CABG + dysrhythmias Atrial Fibrillation  Rhythm:Regular Rate:Normal - Systolic murmurs and - Diastolic murmurs    Neuro/Psych CVA, No Residual Symptoms negative psych ROS   GI/Hepatic Neg liver ROS, GERD  ,  Endo/Other  diabetes, Oral Hypoglycemic Agents  Renal/GU negative Renal ROS     Musculoskeletal negative musculoskeletal ROS (+)   Abdominal (+) - obese,   Peds  Hematology  (+) anemia ,   Anesthesia Other Findings Past Medical History: 2017: Cancer ([place])     Comment:  skin  No date: COPD (chronic obstructive pulmonary disease) (HCC) No date: Diabetes mellitus without complication (HCC) No date: GERD (gastroesophageal reflux disease) No date: Hyperlipidemia No date: Hypertension No date: Mitral regurgitation No date: PAF (paroxysmal atrial fibrillation) (HCC) No date: Stroke [facility])   Reproductive/Obstetrics                             Anesthesia Physical Anesthesia Plan  ASA: III  Anesthesia Plan: General   Post-op Pain Management:    Induction: Intravenous  PONV Risk Score and Plan: 1 and Propofol infusion  Airway Management Planned: Natural Airway  Additional Equipment:   Intra-op Plan:   Post-operative Plan:   Informed Consent: I have reviewed the patients History and Physical, chart, labs and discussed  the procedure including the risks, benefits and alternatives for the proposed anesthesia with the patient or authorized representative who has indicated his/her understanding and acceptance.     Dental advisory given  Plan Discussed with: CRNA and Anesthesiologist  Anesthesia Plan Comments:         Anesthesia Quick Evaluation

## 2018-08-14 NOTE — Progress Notes (Signed)
Hematology/Oncology Progress Note [facility] Telephone:([phone] [phone]  Patient Care Team: Elby Beck, FNP as PCP - General (Nurse Practitioner)   Name of the patient: Gabriel Brewer  [id number]  01-29-1940  Date of visit: 08/14/18   INTERVAL HISTORY-  Patient is lying in bed.  No acute overnight events.  No new complaints. CT abdomen pelvis was done which showed fluid collection in the gallbladder fossa, 2.1 x 5.5 cm. Significant diverticular disease without evidence of acute diverticulitis. Right renal lesion 1.4 cm indeterminate   Review of systems- Review of Systems  Constitutional: Positive for fatigue.  Respiratory: Negative for cough and shortness of breath.   Gastrointestinal: Positive for blood in stool. Negative for abdominal pain.  Genitourinary: Negative for dysuria and frequency.   Musculoskeletal: Negative for arthralgias.  Skin: Negative for rash.  Neurological: Negative for dizziness.  Psychiatric/Behavioral: Negative for confusion.    No Known Allergies  Patient Active Problem List   Diagnosis Date Noted   Angiodysplasia of stomach and duodenum with hemorrhage    Melena    GI bleed 08/13/2018   Acute blood loss anemia    Iron deficiency anemia 11/14/2017   Elevated prostate specific antigen (PSA) 11/12/2017   Esophageal reflux 11/12/2017   Family history of malignant neoplasm of prostate 11/12/2017   Low back pain 11/12/2017   Vitamin D deficiency 11/12/2017   Pneumonia 11/03/2016   Endocarditis of mitral valve 09/27/2015   Mitral regurgitation 09/27/2015   Essential hypertension 08/18/2015   Skin cancer of face 02/06/2015   Mixed hyperlipidemia 10/22/2014     Past Medical History:  Diagnosis Date   Cancer ([place]) 2017   skin    COPD (chronic obstructive pulmonary disease) (HCC)    Diabetes mellitus without complication (HCC)    GERD (gastroesophageal reflux disease)     Hyperlipidemia    Hypertension    Mitral regurgitation    PAF (paroxysmal atrial fibrillation) ([place])    Stroke [facility]      Past Surgical History:  Procedure Laterality Date   CHOLECYSTECTOMY N/A 07/30/2018   Procedure: LAPAROSCOPIC CHOLECYSTECTOMY WITH INTRAOPERATIVE CHOLANGIOGRAM;  Surgeon: Greer Pickerel, MD;  Location: [facility];  Service: General;  Laterality: N/A;   COLONOSCOPY WITH PROPOFOL N/A 11/17/2017   Procedure: COLONOSCOPY WITH PROPOFOL;  Surgeon: Jonathon Bellows, MD;  Location: [facility] ENDOSCOPY;  Service: Gastroenterology;  Laterality: N/A;   ESOPHAGOGASTRODUODENOSCOPY (EGD) WITH PROPOFOL N/A 11/17/2017   Procedure: ESOPHAGOGASTRODUODENOSCOPY (EGD) WITH PROPOFOL;  Surgeon: Jonathon Bellows, MD;  Location: [facility] ENDOSCOPY;  Service: Gastroenterology;  Laterality: N/A;   GIVENS CAPSULE STUDY N/A 12/09/2017   Procedure: GIVENS CAPSULE STUDY;  Surgeon: Jonathon Bellows, MD;  Location: [facility] ENDOSCOPY;  Service: Gastroenterology;  Laterality: N/A;   TONSILLECTOMY      Social History   Socioeconomic History   Marital status: Married    Spouse name: Not on file   Number of children: Not on file   Years of education: Not on file   Highest education level: Not on file  Occupational History   Occupation: retired  Scientist, product/process development strain: Not on file   Food insecurity    Worry: Not on file    Inability: Not on Lexicographer needs    Medical: Not on file    Non-medical: Not on file  Tobacco Use   Smoking status: Former Smoker    Packs/day: 0.50    Years: 20.00    Pack years: 10.00    Types:  Cigarettes    Quit date: 11/20/1982    Years since quitting: 35.7   Smokeless tobacco: Never Used  Substance and Sexual Activity   Alcohol use: No   Drug use: No   Sexual activity: Not Currently    Partners: Female  Lifestyle   Physical activity    Days per week: Not on file    Minutes per session: Not on file   Stress: Not on file  Relationships    Social connections    Talks on phone: Not on file    Gets together: Not on file    Attends religious service: Not on file    Active member of club or organization: Not on file    Attends meetings of clubs or organizations: Not on file    Relationship status: Not on file   Intimate partner violence    Fear of current or ex partner: Not on file    Emotionally abused: Not on file    Physically abused: Not on file    Forced sexual activity: Not on file  Other Topics Concern   Not on file  Social History Narrative   Not on file     Family History  Problem Relation Age of Onset   Cerebral aneurysm Mother    Heart disease Father    COPD Father    Breast cancer Sister    Cancer Brother        [email or web address]     Current Facility-Administered Medications:    0.9 %  sodium chloride infusion, , Intravenous, Continuous, Demetrios Loll, MD, Last Rate: 75 mL/hr at 08/14/18 0844   acetaminophen (TYLENOL) tablet 650 mg, 650 mg, Oral, Q6H PRN **OR** acetaminophen (TYLENOL) suppository 650 mg, 650 mg, Rectal, Q6H PRN, Seals, Angela H, NP   atorvastatin (LIPITOR) tablet 10 mg, 10 mg, Oral, Daily, Seals, Angela H, NP   cholecalciferol (VITAMIN D3) tablet 4,000 Units, 4,000 Units, Oral, Q Fri, Seals, Angela H, NP, 4,000 Units at 08/14/18 0841   diltiazem (CARDIZEM CD) 24 hr capsule 180 mg, 180 mg, Oral, Daily, Seals, Angela H, NP, 180 mg at 08/14/18 0841   insulin aspart (novoLOG) injection 0-5 Units, 0-5 Units, Subcutaneous, QHS, Seals, Angela H, NP   insulin aspart (novoLOG) injection 0-9 Units, 0-9 Units, Subcutaneous, TID WC, Seals, Angela H, NP   iron sucrose (VENOFER) 200 mg in sodium chloride 0.9 % 150 mL IVPB, 200 mg, Intravenous, Daily, Earlie Server, MD, Last Rate: 160 mL/hr at 08/14/18 0848, 200 mg at 08/14/18 0848   ondansetron (ZOFRAN) tablet 4 mg, 4 mg, Oral, Q6H PRN **OR** ondansetron (ZOFRAN) injection 4 mg, 4 mg, Intravenous, Q6H PRN, Seals, Angela H, NP   pantoprazole (PROTONIX)  80 mg in sodium chloride 0.9 % 250 mL (0.32 mg/mL) infusion, 8 mg/hr, Intravenous, Continuous, Veronese, [place], MD, Last Rate: 25 mL/hr at 08/14/18 1140, 8 mg/hr at 08/14/18 1140   [START ON 08/16/2018] pantoprazole (PROTONIX) injection 40 mg, 40 mg, Intravenous, Q12H, Veronese, [place], MD   traMADol (ULTRAM) tablet 50 mg, 50 mg, Oral, Q6H PRN, Demetrios Loll, MD   traZODone (DESYREL) tablet 50 mg, 50 mg, Oral, QHS PRN, Mayer Camel, NP   Physical exam:  Vitals:   08/14/18 1430 08/14/18 1440 08/14/18 1450 08/14/18 1601  BP: (!) 88/54 122/64 122/64 122/68  Pulse: 69 76 76 79  Resp: 14 20 16 18   Temp:    97.9 F (36.6 C)  TempSrc:    Oral  SpO2: 97% 98% 97% 99%  Weight:  Height:       Physical Exam  Constitutional: He is oriented to person, place, and time. No distress.  HENT:  Head: Normocephalic and atraumatic.  Mouth/Throat: No oropharyngeal exudate.  Eyes: Pupils are equal, round, and reactive to light. EOM are normal. No scleral icterus.  Neck: Normal range of motion. Neck supple.  Cardiovascular: Normal rate.  No murmur heard. Pulmonary/Chest: Effort normal. No respiratory distress. He has no rales. He exhibits no tenderness.  Abdominal: Soft. He exhibits no distension. There is no abdominal tenderness.  Musculoskeletal: Normal range of motion.        General: No edema.  Neurological: He is alert and oriented to person, place, and time. No cranial nerve deficit. He exhibits normal muscle tone. Coordination normal.  Skin: Skin is warm and dry. He is not diaphoretic. No erythema. There is pallor.  Psychiatric: Affect normal.       CMP Latest Ref Rng & Units 08/13/2018  Glucose 70 - 99 mg/dL 123(H)  BUN 8 - 23 mg/dL 26(H)  Creatinine 0.61 - 1.24 mg/dL 1.04  Sodium 135 - 145 mmol/L 138  Potassium 3.5 - 5.1 mmol/L 3.9  Chloride 98 - 111 mmol/L 109  CO2 22 - 32 mmol/L 21(L)  Calcium 8.9 - 10.3 mg/dL 8.5(L)  Total Protein 6.5 - 8.1 g/dL 5.9(L)  Total Bilirubin  0.3 - 1.2 mg/dL 1.1  Alkaline Phos 38 - 126 U/L 53  AST 15 - 41 U/L 17  ALT 0 - 44 U/L 16   CBC Latest Ref Rng & Units 08/14/2018  WBC 4.0 - 10.5 K/uL -  Hemoglobin 13.0 - 17.0 g/dL 8.5(L)  Hematocrit 39.0 - 52.0 % -  Platelets 150 - 400 K/uL -   RADIOGRAPHIC STUDIES: I have personally reviewed the radiological images as listed and agreed with the findings in the report.  Dg Chest 1 View  Result Date: 08/13/2018 CLINICAL DATA:  Weakness EXAM: CHEST  1 VIEW COMPARISON:  03/13/2018, 11/29/2017, CT 11/05/2016 FINDINGS: Hyperinflation with emphysematous disease. No acute consolidation or pleural effusion. Stable cardiomediastinal silhouette. No pneumothorax. Apically pleural thickening. Small stellate focus in the right suprahilar lung. IMPRESSION: No active disease. Small stellate opacity in the right suprahilar lung either representing summation shadow versus small nodule. Suggest short interval two-view radiographic follow-up Electronically Signed   By: Donavan Foil M.D.   On: 08/13/2018 01:18   Dg Cholangiogram Operative  Result Date: 07/30/2018 CLINICAL DATA:  Intraoperative cholangiogram during laparoscopic cholecystectomy. EXAM: INTRAOPERATIVE CHOLANGIOGRAM FLUOROSCOPY TIME:  10 seconds (2.34 mGy) COMPARISON:  Abdominal ultrasound-03/16/2018 FINDINGS: Intraoperative cholangiographic images of the right upper abdominal quadrant during laparoscopic cholecystectomy are provided for review. Surgical clips overlie the expected location of the gallbladder fossa. Contrast injection demonstrates selective cannulation of the central aspect of the cystic duct. There is passage of contrast through the central aspect of the cystic duct with filling of a non dilated common bile duct. There is passage of contrast though the CBD and into the descending portion of the duodenum. There is minimal reflux of injected contrast into the common hepatic duct and central aspect of the non dilated intrahepatic biliary  system. Transient mobile filling defect within the common hepatic duct is favored to represent an air bubble. There are no discrete filling defects within the opacified portions of the biliary system to suggest the presence of choledocholithiasis. IMPRESSION: No definite evidence of choledocholithiasis. Electronically Signed   By: Sandi Mariscal M.D.   On: 07/30/2018 15:50   Ct Abdomen Pelvis W  Contrast  Addendum Date: 08/14/2018   ADDENDUM REPORT: 08/14/2018 09:59 ADDENDUM: 1.4 centimeter lesion in the mid pole of the RIGHT kidney is indeterminate. Recommend further evaluation with renal protocol MRI or CT. Electronically Signed   By: Nolon Nations M.D.   On: 08/14/2018 09:59   Result Date: 08/14/2018 CLINICAL DATA:  Per MD note: 78 y.o. y/o male with recent cholecystectomy 2 weeks ago, admitted with melena and anemia, with previous history of iron deficiency anemia and non bleeding AVMs seen in small bowel capsule endoscopy in December 2019. Patient on Xarelto. EXAM: CT ABDOMEN AND PELVIS WITH CONTRAST TECHNIQUE: Multidetector CT imaging of the abdomen and pelvis was performed using the standard protocol following bolus administration of intravenous contrast. CONTRAST:  190mL OMNIPAQUE IOHEXOL 300 MG/ML  SOLN COMPARISON:  CT of the chest, abdomen, pelvis on 09/16/2015 FINDINGS: Lower chest: Minimal LEFT LOWER lobe atelectasis. There is coronary artery atherosclerosis. Heart size is normal. Hepatobiliary: No focal liver abnormality is seen. Status post cholecystectomy. There is persistent fluid collection in the gallbladder fossa, measuring 2.1 x 5.5 centimeters. Pancreas: Unremarkable. No pancreatic ductal dilatation or surrounding inflammatory changes. Spleen: A 7 millimeter hypodense lesion is identified in the MEDIAL spleen. A 3 millimeter hyperdense lesion is identified in the LATERAL spleen. A 4 millimeter nodule is identified in the LOWER aspect of the spleen. Spleen is normal in size. Adrenals/Urinary  Tract: Adrenal glands are normal. A 1.4 centimeter low-attenuation lesion within the midpole of the RIGHT kidney is 54 Hounsfield units, indeterminate for cyst. No hydronephrosis. Ureters are unremarkable. Prominent impression of the prostate upon the urinary bladder. Otherwise the bladder is unremarkable. Stomach/Bowel: Moderate hiatal hernia. Stomach is otherwise normal in appearance. Small bowel loops are unremarkable. There are innumerable colonic diverticula, particularly involving the sigmoid segment where there is hypertrophy of the wall. No evidence for acute diverticulitis. The appendix is well seen and has a normal appearance. Vascular/Lymphatic: There is atherosclerotic calcification of the abdominal aorta. No associated aneurysm. No retroperitoneal or mesenteric adenopathy. Reproductive: Enlarged prostate. Other: Edema identified in the region of the umbilicus, likely indicating recent laparoscopy. No ascites. Musculoskeletal: No suspicious lytic or blastic lesions. Degenerative changes in the lumbar spine. Significant disc height loss and uncovertebral spurring at L2-3. IMPRESSION: 1. Status post cholecystectomy. Persistent fluid collection in the gallbladder fossa, measuring 2.1 x 5.5 cm. Findings may be related to postoperative change. Early infection is not excluded. The absence of ascites makes a bile leak less likely. 2. Small splenic lesions, too small to characterize. 3. Moderate hiatal hernia. 4. Coronary artery disease. 5. Enlarged prostate gland. 6. Significant diverticular disease without evidence for acute diverticulitis. 7. Degenerative changes in the lumbar spine. 8. Aortic Atherosclerosis (ICD10-I70.0). Electronically Signed: By: Nolon Nations M.D. On: 08/13/2018 19:32    Assessment and plan-  Patient is a 78 y.o. male with history of iron deficiency, small bowel AVM, recent cholecystectomy, presented to emergency room for evaluation of worsening weakness and malaise, dark  stool.  #Severe iron deficiency anemia Status post PRBC transfusion yesterday, also IV Venofer 200 mg x 1 today. Hemoglobin 8.5 today. Continue close monitor. I will continue to support him with additional IV Venofer infusions.  Plan IV Venofer 200 mg x 1 tomorrow.  #Source of bleeding, GI tract blood loss versus postsurgical hematoma. CT abdomen pelvis showed Postsurgical fluid collection, ?  Hematoma seen by Dr. Peyton Najjar surgery, no intervention needed. He is scheduled to have small bowel enteroscopy for further evaluation-  Enteroscopy showed a single  angiodysplastic lesion with bleeding was found in the third portion of the duodenum.  Coagulation for hemostasis using argon plasma was successful.  If patient remains stable tomorrow and no additional hemoglobin drop, patient can be discharged from hematology aspect with outpatient follow-up for additional IV Venofer treatments. Thank you for allowing me to participate in the care of this patient.   Earlie Server, MD, PhD Hematology Oncology [facility] Pager- [phone] 08/14/2018

## 2018-08-14 NOTE — Op Note (Signed)
[facility] Patient Name: Gabriel Brewer Procedure Date: 08/14/2018 1:18 PM MRN: [id number] Account #: [id number] Date of Birth: 01-Jan-1941 Admit Type: Inpatient Age: 78 Room: [facility] ENDO ROOM 1 Gender: Male Note Status: Finalized Procedure:            Small bowel enteroscopy Indications:          Melena Providers:            Analeia Ismael B. Bonna Gains MD, MD Referring MD:         [facility] ***Barton Dubois (Referring MD), No                        Local Md, MD (Referring MD) Medicines:            Monitored Anesthesia Care Complications:        No immediate complications. Procedure:            Pre-Anesthesia Assessment:                       - Prior to the procedure, a History and Physical was                        performed, and patient medications and allergies were                        reviewed. The patient is competent. The risks and                        benefits of the procedure and the sedation options and                        risks were discussed with the patient. All questions                        were answered and informed consent was obtained.                        Patient identification and proposed procedure were                        verified by the physician, the nurse, the                        anesthesiologist, the anesthetist and the technician in                        the pre-procedure area in the procedure room in the                        endoscopy suite. Mental Status Examination: normal.                        Airway Examination: normal oropharyngeal airway and                        neck mobility. Respiratory Examination: clear to                        auscultation. CV Examination: normal. Prophylactic  Antibiotics: The patient does not require prophylactic                        antibiotics. Prior Anticoagulants: The patient has                        taken Xarelto (rivaroxaban), last dose was  2 days prior                        to procedure. ASA Grade Assessment: II - A patient with                        mild systemic disease. After reviewing the risks and                        benefits, the patient was deemed in satisfactory                        condition to undergo the procedure. The anesthesia plan                        was to use monitored anesthesia care (MAC). Immediately                        prior to administration of medications, the patient was                        re-assessed for adequacy to receive sedatives. The                        heart rate, respiratory rate, oxygen saturations, blood                        pressure, adequacy of pulmonary ventilation, and                        response to care were monitored throughout the                        procedure. The physical status of the patient was                        re-assessed after the procedure.                       After obtaining informed consent, the endoscope was                        passed under direct vision. Throughout the procedure,                        the patient's blood pressure, pulse, and oxygen                        saturations were monitored continuously. The                        Colonoscope was introduced through the mouth and  advanced to the jejunum, to the 160 cm mark (from the                        incisors). The small bowel enteroscopy was accomplished                        with ease. The patient tolerated the procedure well. Findings:      The examined esophagus was normal.      The entire examined stomach was normal. However, the gastric exam was       limited as the stomach did not inflate well due to patient's hiatal       hernia and pt mouth breathing throughout the exam and letting air out.       Even with turning off the suction, the stomach did not inflate well.       However, pt had a recent EGD in Nov 2019 with [name] where this area        was evaluated.      A medium-sized hiatal hernia was present.      There is no endoscopic evidence of bleeding in the entire examined       stomach.      A single angiodysplastic lesion with bleeding was found in the third       portion of the duodenum. Coagulation for hemostasis using argon plasma       was successful.      There was no evidence of significant pathology in the duodenal bulb and       in the second portion of the duodenum.      There was no evidence of significant pathology in the entire examined       portion of jejunum. Impression:           - Normal esophagus.                       - Normal stomach.                       - Medium-sized hiatal hernia.                       - A single bleeding angiodysplastic lesion in the                        duodenum. Treated with argon plasma coagulation (APC).                       - The examined portion of the jejunum was normal.                       - No specimens collected. Recommendation:       - Clear liquid diet today, then advance as tolerated to                        resume previous diet.                       - Continue Serial CBCs and transfuse PRN                       - Continue present medications.                       -  The findings and recommendations were discussed with                        the patient.                       - Return to GI clinic in 4 weeks, with [name].                       - Return to primary care physician in 4 weeks. Procedure Code(s):    --- Professional ---                       [phone], Small intestinal endoscopy, enteroscopy beyond                        second portion of duodenum, not including ileum; with                        control of bleeding (eg, injection, bipolar cautery,                        unipolar cautery, laser, heater probe, stapler, plasma                        coagulator) Diagnosis Code(s):    --- Professional ---                       K31.811,  Angiodysplasia of stomach and duodenum with                        bleeding                       K92.1, Melena (includes Hematochezia) CPT copyright 2019 American [employer] Association. All rights reserved. The codes documented in this report are preliminary and upon coder review may  be revised to meet current compliance requirements.  Vonda Antigua, MD Margretta Sidle B. Bonna Gains MD, MD 08/14/2018 2:38:11 PM This report has been signed electronically. Number of Addenda: 0 Note Initiated On: 08/14/2018 1:18 PM Estimated Blood Loss: Estimated blood loss: none.      [facility]

## 2018-08-14 NOTE — Anesthesia Post-op Follow-up Note (Signed)
Anesthesia QCDR form completed.        

## 2018-08-14 NOTE — Progress Notes (Signed)
[place] [facility] [place] NAME: Gabriel Brewer    MR#:  [id number]  DATE OF BIRTH:  [birth year]  SUBJECTIVE:  CHIEF COMPLAINT:   Chief Complaint  Patient presents with  . Weakness   The patient denies any melena or bloody stool. REVIEW OF SYSTEMS:  Review of Systems  Constitutional: Positive for malaise/fatigue. Negative for chills and fever.  HENT: Negative for sore throat.   Eyes: Negative for blurred vision and double vision.  Respiratory: Negative for cough, hemoptysis, shortness of breath, wheezing and stridor.   Cardiovascular: Negative for chest pain, palpitations, orthopnea and leg swelling.  Gastrointestinal: Negative for abdominal pain, blood in stool, diarrhea, melena, nausea and vomiting.  Genitourinary: Negative for dysuria, flank pain and hematuria.  Musculoskeletal: Negative for back pain and joint pain.  Skin: Negative for rash.  Neurological: Negative for dizziness, sensory change, focal weakness, seizures, loss of consciousness, weakness and headaches.  Endo/Heme/Allergies: Negative for polydipsia.  Psychiatric/Behavioral: Negative for depression. The patient is not nervous/anxious.     DRUG ALLERGIES:  No Known Allergies VITALS:  Blood pressure 115/68, pulse 83, temperature 98.1 F (36.7 C), temperature source Oral, resp. rate 19, height 5\' 11"  (1.803 m), weight 76.7 kg, SpO2 97 %. PHYSICAL EXAMINATION:  Physical Exam Constitutional:      General: He is not in acute distress. HENT:     Head: Normocephalic.     Mouth/Throat:     Mouth: Mucous membranes are moist.  Eyes:     General: No scleral icterus.    Conjunctiva/sclera: Conjunctivae normal.     Pupils: Pupils are equal, round, and reactive to light.  Neck:     Musculoskeletal: Normal range of motion and neck supple.     Vascular: No JVD.     Trachea: No tracheal deviation.  Cardiovascular:     Rate and Rhythm: Normal rate and regular rhythm.     Heart  sounds: Normal heart sounds. No murmur. No gallop.   Pulmonary:     Effort: Pulmonary effort is normal. No respiratory distress.     Breath sounds: Normal breath sounds. No wheezing or rales.  Abdominal:     General: Bowel sounds are normal. There is no distension.     Palpations: Abdomen is soft.     Tenderness: There is no abdominal tenderness. There is no rebound.  Musculoskeletal: Normal range of motion.        General: No tenderness.     Right lower leg: No edema.     Left lower leg: No edema.  Skin:    Findings: No erythema or rash.  Neurological:     General: No focal deficit present.     Mental Status: He is alert and oriented to person, place, and time.     Cranial Nerves: No cranial nerve deficit.  Psychiatric:        Mood and Affect: Mood normal.    LABORATORY PANEL:  Male CBC Recent Labs  Lab 08/13/18 0402  08/13/18 1953 08/14/18 0530  WBC 9.8  --   --   --   HGB 7.8*   < > 9.0* 8.5*  HCT 23.8*   < > 26.4*  --   PLT 214  --   --   --    < > = values in this interval not displayed.   ------------------------------------------------------------------------------------------------------------------ Chemistries  Recent Labs  Lab 08/13/18 1348  NA 138  K 3.9  CL 109  CO2 21*  GLUCOSE 123*  BUN 26*  CREATININE 1.04  CALCIUM 8.5*  AST 17  ALT 16  ALKPHOS 53  BILITOT 1.1   RADIOLOGY:  Ct Abdomen Pelvis W Contrast  Addendum Date: 08/14/2018   ADDENDUM REPORT: 08/14/2018 09:59 ADDENDUM: 1.4 centimeter lesion in the mid pole of the RIGHT kidney is indeterminate. Recommend further evaluation with renal protocol MRI or CT. Electronically Signed   By: Nolon Nations M.D.   On: 08/14/2018 09:59   Result Date: 08/14/2018 CLINICAL DATA:  Per MD note: 78 y.o. y/o male with recent cholecystectomy 2 weeks ago, admitted with melena and anemia, with previous history of iron deficiency anemia and non bleeding AVMs seen in small bowel capsule endoscopy in December  2019. Patient on Xarelto. EXAM: CT ABDOMEN AND PELVIS WITH CONTRAST TECHNIQUE: Multidetector CT imaging of the abdomen and pelvis was performed using the standard protocol following bolus administration of intravenous contrast. CONTRAST:  193mL OMNIPAQUE IOHEXOL 300 MG/ML  SOLN COMPARISON:  CT of the chest, abdomen, pelvis on 09/16/2015 FINDINGS: Lower chest: Minimal LEFT LOWER lobe atelectasis. There is coronary artery atherosclerosis. Heart size is normal. Hepatobiliary: No focal liver abnormality is seen. Status post cholecystectomy. There is persistent fluid collection in the gallbladder fossa, measuring 2.1 x 5.5 centimeters. Pancreas: Unremarkable. No pancreatic ductal dilatation or surrounding inflammatory changes. Spleen: A 7 millimeter hypodense lesion is identified in the MEDIAL spleen. A 3 millimeter hyperdense lesion is identified in the LATERAL spleen. A 4 millimeter nodule is identified in the LOWER aspect of the spleen. Spleen is normal in size. Adrenals/Urinary Tract: Adrenal glands are normal. A 1.4 centimeter low-attenuation lesion within the midpole of the RIGHT kidney is 54 Hounsfield units, indeterminate for cyst. No hydronephrosis. Ureters are unremarkable. Prominent impression of the prostate upon the urinary bladder. Otherwise the bladder is unremarkable. Stomach/Bowel: Moderate hiatal hernia. Stomach is otherwise normal in appearance. Small bowel loops are unremarkable. There are innumerable colonic diverticula, particularly involving the sigmoid segment where there is hypertrophy of the wall. No evidence for acute diverticulitis. The appendix is well seen and has a normal appearance. Vascular/Lymphatic: There is atherosclerotic calcification of the abdominal aorta. No associated aneurysm. No retroperitoneal or mesenteric adenopathy. Reproductive: Enlarged prostate. Other: Edema identified in the region of the umbilicus, likely indicating recent laparoscopy. No ascites. Musculoskeletal: No  suspicious lytic or blastic lesions. Degenerative changes in the lumbar spine. Significant disc height loss and uncovertebral spurring at L2-3. IMPRESSION: 1. Status post cholecystectomy. Persistent fluid collection in the gallbladder fossa, measuring 2.1 x 5.5 cm. Findings may be related to postoperative change. Early infection is not excluded. The absence of ascites makes a bile leak less likely. 2. Small splenic lesions, too small to characterize. 3. Moderate hiatal hernia. 4. Coronary artery disease. 5. Enlarged prostate gland. 6. Significant diverticular disease without evidence for acute diverticulitis. 7. Degenerative changes in the lumbar spine. 8. Aortic Atherosclerosis (ICD10-I70.0). Electronically Signed: By: Nolon Nations M.D. On: 08/13/2018 19:32   ASSESSMENT AND PLAN:   .  GI bleed The patient has been kept n.p.o. with IV fluid support.  He has been treated with IV Protonix.  CT abdomen: Status post cholecystectomy. Persistent fluid collection in the gallbladder fossa, no further imaging or procedure per Dr. Zachery Dauer. EGD today per Dr. Bonna Gains.  2.  Anemia, due to acute blood loss secondary to GI bleeding. The patient got 1 unit PRBC transfusion IV iron per Dr. Tasia Catchings. Hemoglobin increased to 9.3 but decreased to 8.5 today.  3.  Generalized weakness  PT evaluation before discharge.  4.  History of atrial fibrillation on Xarelto - Telemetry monitoring Hold Xarelto pending hematology consultation with current GI bleed and anemia I discussed with Dr. Bonna Gains. I called the patient's son, nobody answered the phone. All the records are reviewed and case discussed with Care Management/Social Worker. Management plans discussed with the patient, family and they are in agreement.  CODE STATUS: Full Code  TOTAL TIME TAKING CARE OF THIS PATIENT: 35 minutes.   More than 50% of the time was spent in counseling/coordination of care: YES  POSSIBLE D/C IN 2 DAYS, DEPENDING ON  CLINICAL CONDITION.   Demetrios Loll M.D on 08/14/2018 at 11:13 AM  Between 7am to 6pm - Pager - [phone]  After 6pm go to [email or web address] - [facility]

## 2018-08-14 NOTE — Progress Notes (Signed)
Vonda Antigua, MD [address], [place], [place], [place], [address] [address], [place], [place], [place], [address] Phone: [phone]  Fax: [phone]   Subjective: No further melena. No abdominal pain  Objective: Exam: Vital signs in last 24 hours: Vitals:   08/13/18 1634 08/13/18 2021 08/14/18 0458 08/14/18 0723  BP: 122/65 129/79 119/65 115/68  Pulse: 81 83 84 83  Resp: 18 18 18 19   Temp:  98.1 F (36.7 C) 98.4 F (36.9 C) 98.1 F (36.7 C)  TempSrc:  Oral Oral Oral  SpO2: 100% 100% 98% 97%  Weight:      Height:       Weight change:   Intake/Output Summary (Last 24 hours) at 08/14/2018 1318 Last data filed at 08/14/2018 1216 Gross per 24 hour  Intake 442.01 ml  Output 0 ml  Net 442.01 ml    General: No acute distress, AAO x3 Abd: Soft, NT/ND, No HSM Skin: Warm, no rashes Neck: Supple, Trachea midline   Lab Results: Lab Results  Component Value Date   WBC 9.8 08/13/2018   HGB 8.5 (L) 08/14/2018   HCT 26.4 (L) 08/13/2018   MCV 90.2 08/13/2018   PLT 214 08/13/2018   Micro Results: Recent Results (from the past 240 hour(s))  SARS Coronavirus 2 [facility] order, Performed in [facility] hospital lab) Nasopharyngeal Nasopharyngeal Swab     Status: None   Collection Time: 08/14/18 11:49 AM   Specimen: Nasopharyngeal Swab  Result Value Ref Range Status   SARS Coronavirus 2 NEGATIVE NEGATIVE Final    Comment: (NOTE) If result is NEGATIVE SARS-CoV-2 target nucleic acids are NOT DETECTED. The SARS-CoV-2 RNA is generally detectable in upper and lower  respiratory specimens during the acute phase of infection. The lowest  concentration of SARS-CoV-2 viral copies this assay can detect is 250  copies / mL. A negative result does not preclude SARS-CoV-2 infection  and should not be used as the sole basis for treatment or other  patient management decisions.  A negative result may occur with  improper specimen collection / handling, submission of  specimen other  than nasopharyngeal swab, presence of viral mutation(s) within the  areas targeted by this assay, and inadequate number of viral copies  (<250 copies / mL). A negative result must be combined with clinical  observations, patient history, and epidemiological information. If result is POSITIVE SARS-CoV-2 target nucleic acids are DETECTED. The SARS-CoV-2 RNA is generally detectable in upper and lower  respiratory specimens dur ing the acute phase of infection.  Positive  results are indicative of active infection with SARS-CoV-2.  Clinical  correlation with patient history and other diagnostic information is  necessary to determine patient infection status.  Positive results do  not rule out bacterial infection or co-infection with other viruses. If result is PRESUMPTIVE POSTIVE SARS-CoV-2 nucleic acids MAY BE PRESENT.   A presumptive positive result was obtained on the submitted specimen  and confirmed on repeat testing.  While 2019 novel coronavirus  (SARS-CoV-2) nucleic acids may be present in the submitted sample  additional confirmatory testing may be necessary for epidemiological  and / or clinical management purposes  to differentiate between  SARS-CoV-2 and other Sarbecovirus currently known to infect humans.  If clinically indicated additional testing with an alternate test  methodology [phone]) is advised. The SARS-CoV-2 RNA is generally  detectable in upper and lower respiratory sp ecimens during the acute  phase of infection. The expected result is Negative. Fact Sheet for Patients:  StrictlyIdeas.no Fact Sheet for Healthcare Providers: BankingDealers.co.za This test is not yet approved or cleared by the [place] FDA and has been authorized for detection and/or diagnosis of SARS-CoV-2 by FDA under an Emergency Use Authorization (EUA).  This EUA will remain in effect (meaning this test can be used) for the  duration of the COVID-19 declaration under Section 564(b)(1) of the Act, 21 U.S.C. section 360bbb-3(b)(1), unless the authorization is terminated or revoked sooner. Performed at [facility], [address] [place], [place] [address]    Studies/Results: Dg Chest 1 View  Result Date: 08/13/2018 CLINICAL DATA:  Weakness EXAM: CHEST  1 VIEW COMPARISON:  03/13/2018, 11/29/2017, CT 11/05/2016 FINDINGS: Hyperinflation with emphysematous disease. No acute consolidation or pleural effusion. Stable cardiomediastinal silhouette. No pneumothorax. Apically pleural thickening. Small stellate focus in the right suprahilar lung. IMPRESSION: No active disease. Small stellate opacity in the right suprahilar lung either representing summation shadow versus small nodule. Suggest short interval two-view radiographic follow-up Electronically Signed   By: Donavan Foil M.D.   On: 08/13/2018 01:18   Ct Abdomen Pelvis W Contrast  Addendum Date: 08/14/2018   ADDENDUM REPORT: 08/14/2018 09:59 ADDENDUM: 1.4 centimeter lesion in the mid pole of the RIGHT kidney is indeterminate. Recommend further evaluation with renal protocol MRI or CT. Electronically Signed   By: Nolon Nations M.D.   On: 08/14/2018 09:59   Result Date: 08/14/2018 CLINICAL DATA:  Per MD note: 78 y.o. y/o male with recent cholecystectomy 2 weeks ago, admitted with melena and anemia, with previous history of iron deficiency anemia and non bleeding AVMs seen in small bowel capsule endoscopy in December 2019. Patient on Xarelto. EXAM: CT ABDOMEN AND PELVIS WITH CONTRAST TECHNIQUE: Multidetector CT imaging of the abdomen and pelvis was performed using the standard protocol following bolus administration of intravenous contrast. CONTRAST:  131mL OMNIPAQUE IOHEXOL 300 MG/ML  SOLN COMPARISON:  CT of the chest, abdomen, pelvis on 09/16/2015 FINDINGS: Lower chest: Minimal LEFT LOWER lobe atelectasis. There is coronary artery atherosclerosis. Heart size is  normal. Hepatobiliary: No focal liver abnormality is seen. Status post cholecystectomy. There is persistent fluid collection in the gallbladder fossa, measuring 2.1 x 5.5 centimeters. Pancreas: Unremarkable. No pancreatic ductal dilatation or surrounding inflammatory changes. Spleen: A 7 millimeter hypodense lesion is identified in the MEDIAL spleen. A 3 millimeter hyperdense lesion is identified in the LATERAL spleen. A 4 millimeter nodule is identified in the LOWER aspect of the spleen. Spleen is normal in size. Adrenals/Urinary Tract: Adrenal glands are normal. A 1.4 centimeter low-attenuation lesion within the midpole of the RIGHT kidney is 54 Hounsfield units, indeterminate for cyst. No hydronephrosis. Ureters are unremarkable. Prominent impression of the prostate upon the urinary bladder. Otherwise the bladder is unremarkable. Stomach/Bowel: Moderate hiatal hernia. Stomach is otherwise normal in appearance. Small bowel loops are unremarkable. There are innumerable colonic diverticula, particularly involving the sigmoid segment where there is hypertrophy of the wall. No evidence for acute diverticulitis. The appendix is well seen and has a normal appearance. Vascular/Lymphatic: There is atherosclerotic calcification of the abdominal aorta. No associated aneurysm. No retroperitoneal or mesenteric adenopathy. Reproductive: Enlarged prostate. Other: Edema identified in the region of the umbilicus, likely indicating recent laparoscopy. No ascites. Musculoskeletal: No suspicious lytic or blastic lesions. Degenerative changes in the lumbar spine. Significant disc height loss and uncovertebral spurring at L2-3. IMPRESSION: 1. Status post cholecystectomy. Persistent fluid collection in the gallbladder fossa, measuring 2.1 x 5.5 cm. Findings may be related to postoperative change. Early infection is  not excluded. The absence of ascites makes a bile leak less likely. 2. Small splenic lesions, too small to characterize.  3. Moderate hiatal hernia. 4. Coronary artery disease. 5. Enlarged prostate gland. 6. Significant diverticular disease without evidence for acute diverticulitis. 7. Degenerative changes in the lumbar spine. 8. Aortic Atherosclerosis (ICD10-I70.0). Electronically Signed: By: Nolon Nations M.D. On: 08/13/2018 19:32   Medications:  Scheduled Meds:  [MAR Hold] atorvastatin  10 mg Oral Daily   [MAR Hold] cholecalciferol  4,000 Units Oral Q Fri   [MAR Hold] diltiazem  180 mg Oral Daily   [MAR Hold] insulin aspart  0-5 Units Subcutaneous QHS   [MAR Hold] insulin aspart  0-9 Units Subcutaneous TID WC   [MAR Hold] pantoprazole  40 mg Intravenous Q12H   Continuous Infusions:  sodium chloride 75 mL/hr at 08/14/18 0844   [MAR Hold] iron sucrose 200 mg (08/14/18 0848)   pantoprozole (PROTONIX) infusion 8 mg/hr (08/14/18 1140)   PRN Meds:.[MAR Hold] acetaminophen **OR** [MAR Hold] acetaminophen, [MAR Hold] ondansetron **OR** [MAR Hold] ondansetron (ZOFRAN) IV, [MAR Hold] traMADol, [MAR Hold] traZODone   Assessment: Active Problems:   GI bleed   Acute blood loss anemia    Plan: CT scan showed fluid in the hepatic bed This was evaluated by surgery and they do not recommend any intervention and do not think this is contributing to his anemia  Therefore, no contraindications to proceeding with endoscopy today  Push enteroscopy today to evaluate previous AVM seen on capsule study by [name] in Dec 2019  PPI IV twice daily  Continue serial CBCs and transfuse PRN Avoid NSAIDs Maintain 2 large-bore IV lines Please page GI with any acute hemodynamic changes, or signs of active GI bleeding    LOS: 1 day   Vonda Antigua, MD 08/14/2018, 1:18 PM

## 2018-08-14 NOTE — Consult Note (Signed)
SURGICAL CONSULTATION NOTE   HISTORY OF PRESENT ILLNESS (HPI):  78 y.o. male presented to [facility] ED for evaluation of weakness. Patient reports feeling weak in the last few days.  He reported that he had seen dark clot in the stool.  He denies loss of consciousness.  Patient has history of cholecystectomy 2 weeks ago.  No issues have been identified since the surgery.  Patient denies abdominal pain.  Patient has been tolerating diet without any problem.  There is no fever or chills.  There is no pain radiation.  There is no alleviating or aggravating factor.  Upon admission patient was found with anemia of 9.2 that dropped to 7.8.  Due to his symptom of weakness confusion was given.  He has not seen blood on his stool since admission.  GI evaluated the patient and ordered a CT scan of the abdomen to rule out any complication from surgical procedure.  Surgery was done on July 16 by Dr. Greer Pickerel from [place].  He had laparoscopic cholecystectomy with intraoperative cholangiogram.  CT scan of the abdomen was personally evaluated.  There is a small collection in the hepatic bed.  There is no ascites, no rim-enhancing collection and no blush.  Surgery is consulted by Dr. Bridgett Larsson in this context for evaluation and management of fluid collection in the hepatic bed.  PAST MEDICAL HISTORY (PMH):  Past Medical History:  Diagnosis Date  . Cancer ([place]) 2017   skin   . COPD (chronic obstructive pulmonary disease) ([place])   . Diabetes mellitus without complication ([place])   . GERD (gastroesophageal reflux disease)   . Hyperlipidemia   . Hypertension   . Mitral regurgitation   . PAF (paroxysmal atrial fibrillation) ([place])   . Stroke [facility])      PAST SURGICAL HISTORY ([place]):  Past Surgical History:  Procedure Laterality Date  . CHOLECYSTECTOMY N/A 07/30/2018   Procedure: LAPAROSCOPIC CHOLECYSTECTOMY WITH INTRAOPERATIVE CHOLANGIOGRAM;  Surgeon: Greer Pickerel, MD;  Location: [facility] ORS;  Service: General;   Laterality: N/A;  . COLONOSCOPY WITH PROPOFOL N/A 11/17/2017   Procedure: COLONOSCOPY WITH PROPOFOL;  Surgeon: Jonathon Bellows, MD;  Location[facility] ENDOSCOPY;  Service: Gastroenterology;  Laterality: N/A;  . ESOPHAGOGASTRODUODENOSCOPY (EGD) WITH PROPOFOL N/A 11/17/2017   Procedure: ESOPHAGOGASTRODUODENOSCOPY (EGD) WITH PROPOFOL;  Surgeon: Jonathon Bellows, MD;  Location: [facility] ENDOSCOPY;  Service: Gastroenterology;  Laterality: N/A;  . GIVENS CAPSULE STUDY N/A 12/09/2017   Procedure: GIVENS CAPSULE STUDY;  Surgeon: Jonathon Bellows, MD;  Location: [facility] ENDOSCOPY;  Service: Gastroenterology;  Laterality: N/A;  . TONSILLECTOMY       MEDICATIONS:  Prior to Admission medications   Medication Sig Start Date End Date Taking? Authorizing Provider  amoxicillin (AMOXIL) 500 MG capsule Take 500 mg by mouth. Take as needed before dental appointments. 07/14/18  Yes [provider]  atorvastatin (LIPITOR) 10 MG tablet Take 1 tablet (10 mg total) by mouth daily. 03/04/18  Yes Elby Beck, FNP  benazepril (LOTENSIN) 40 MG tablet Take 1 tablet (40 mg total) by mouth every evening. Patient taking differently: Take 40 mg by mouth at bedtime.  11/07/16  Yes Gladstone Lighter, MD  Cholecalciferol 4000 units CAPS Take 4,000 Units by mouth every Friday.    Yes [provider]  diltiazem (CARDIZEM CD) 180 MG 24 hr capsule Take 1 capsule (180 mg total) by mouth daily. 05/04/18  Yes Elby Beck, FNP  fluticasone (FLONASE) 50 MCG/ACT nasal spray Place 1 spray into both nostrils daily as needed for allergies.  08/15/17  Yes [provider]  hydrochlorothiazide (HYDRODIURIL) 12.5 MG tablet Take 12.5 mg by mouth daily.  04/21/18  Yes [provider]  metFORMIN (GLUCOPHAGE) 500 MG tablet TAKE 1 TABLET(500 MG) BY MOUTH TWICE DAILY Patient taking differently: Take 500 mg by mouth 2 (two) times a day.  05/16/18  Yes Elby Beck, FNP  omeprazole (PRILOSEC) 40 MG capsule Take 1 capsule (40 mg  total) by mouth daily. 05/04/18  Yes Elby Beck, FNP  rivaroxaban (XARELTO) 20 MG TABS tablet Take 1 tablet (20 mg total) by mouth daily with supper. Patient taking differently: Take 20 mg by mouth at bedtime.  11/07/16  Yes Gladstone Lighter, MD     ALLERGIES:  No Known Allergies   SOCIAL HISTORY:  Social History   Socioeconomic History  . Marital status: Married    Spouse name: Not on file  . Number of children: Not on file  . Years of education: Not on file  . Highest education level: Not on file  Occupational History  . Occupation: retired  Scientific laboratory technician  . Financial resource strain: Not on file  . Food insecurity    Worry: Not on file    Inability: Not on file  . Transportation needs    Medical: Not on file    Non-medical: Not on file  Tobacco Use  . Smoking status: Former Smoker    Packs/day: 0.50    Years: 20.00    Pack years: 10.00    Types: Cigarettes    Quit date: 11/20/1982    Years since quitting: 35.7  . Smokeless tobacco: Never Used  Substance and Sexual Activity  . Alcohol use: No  . Drug use: No  . Sexual activity: Not Currently    Partners: Female  Lifestyle  . Physical activity    Days per week: Not on file    Minutes per session: Not on file  . Stress: Not on file  Relationships  . Social Herbalist on phone: Not on file    Gets together: Not on file    Attends religious service: Not on file    Active member of club or organization: Not on file    Attends meetings of clubs or organizations: Not on file    Relationship status: Not on file  . Intimate partner violence    Fear of current or ex partner: Not on file    Emotionally abused: Not on file    Physically abused: Not on file    Forced sexual activity: Not on file  Other Topics Concern  . Not on file  Social History Narrative  . Not on file    The patient currently resides (home / rehab facility / nursing home): Home The patient normally is (ambulatory / bedbound):  Ambulatory   FAMILY HISTORY:  Family History  Problem Relation Age of Onset  . Cerebral aneurysm Mother   . Heart disease Father   . COPD Father   . Breast cancer Sister   . Cancer Brother        [email or web address]     REVIEW OF SYSTEMS:  Constitutional: denies weight loss, fever, chills, or sweats.  Positive for fatigue and weakness Eyes: denies any other vision changes, history of eye injury  ENT: denies sore throat, hearing problems  Respiratory: denies shortness of breath, wheezing.   Cardiovascular: denies chest pain, palpitations  Gastrointestinal: denies abdominal pain, N/V, or diarrhea/and bowel function as per HPI Genitourinary: denies burning with  urination or urinary frequency Musculoskeletal: denies any other joint pains or cramps  Skin: denies any other rashes or skin discolorations  Neurological: denies any other headache, dizziness Psychiatric: denies any other depression, anxiety   All other review of systems were negative   VITAL SIGNS:  Temp:  [98.1 F (36.7 C)-98.4 F (36.9 C)] 98.1 F (36.7 C) (07/31 0723) Pulse Rate:  [81-84] 83 (07/31 0723) Resp:  [18-19] 19 (07/31 0723) BP: (115-129)/(65-79) 115/68 (07/31 0723) SpO2:  [97 %-100 %] 97 % (07/31 0723)     Height: 5\' 11"  (180.3 cm) Weight: 76.7 kg BMI (Calculated): 23.58   INTAKE/OUTPUT:  This shift: No intake/output data recorded.  Last 2 shifts: @IOLAST2SHIFTS @   PHYSICAL EXAM:  Constitutional:  -- Normal body habitus  -- Awake, alert, and oriented x3  Eyes:  -- Pupils equally round and reactive to light  -- No scleral icterus  Ear, nose, and throat:  -- No jugular venous distension  Pulmonary:  -- No crackles  -- Equal breath sounds bilaterally -- Breathing non-labored at rest Cardiovascular:  -- S1, S2 present  -- No pericardial rubs Gastrointestinal:  -- Abdomen soft, nontender, non-distended, no guarding or rebound tenderness.  Multiple small scars well-healed. -- No abdominal masses  appreciated, pulsatile or otherwise  Musculoskeletal and Integumentary:  -- Wounds or skin discoloration: None appreciated -- Extremities: B/L UE and LE FROM, hands and feet warm, no edema  Neurologic:  -- Motor function: intact and symmetric -- Sensation: intact and symmetric   Labs:  CBC Latest Ref Rng & Units 08/14/2018 08/13/2018 08/13/2018  WBC 4.0 - 10.5 K/uL - - -  Hemoglobin 13.0 - 17.0 g/dL 8.5(L) 9.0(L) 9.3(L)  Hematocrit 39.0 - 52.0 % - 26.4(L) 27.3(L)  Platelets 150 - 400 K/uL - - -   CMP Latest Ref Rng & Units 08/13/2018 08/13/2018 08/12/2018  Glucose 70 - 99 mg/dL 123(H) 132(H) 159(H)  BUN 8 - 23 mg/dL 26(H) 36(H) 40(H)  Creatinine 0.61 - 1.24 mg/dL 1.04 1.06 1.27(H)  Sodium 135 - 145 mmol/L 138 138 135  Potassium 3.5 - 5.1 mmol/L 3.9 4.1 3.6  Chloride 98 - 111 mmol/L 109 108 103  CO2 22 - 32 mmol/L 21(L) 22 22  Calcium 8.9 - 10.3 mg/dL 8.5(L) 8.3(L) 8.8(L)  Total Protein 6.5 - 8.1 g/dL 5.9(L) - -  Total Bilirubin 0.3 - 1.2 mg/dL 1.1 - -  Alkaline Phos 38 - 126 U/L 53 - -  AST 15 - 41 U/L 17 - -  ALT 0 - 44 U/L 16 - -   Imaging studies:  EXAM: CT ABDOMEN AND PELVIS WITH CONTRAST  TECHNIQUE: Multidetector CT imaging of the abdomen and pelvis was performed using the standard protocol following bolus administration of intravenous contrast.  CONTRAST:  132mL OMNIPAQUE IOHEXOL 300 MG/ML  SOLN  COMPARISON:  CT of the chest, abdomen, pelvis on 09/16/2015  FINDINGS: Lower chest: Minimal LEFT LOWER lobe atelectasis. There is coronary artery atherosclerosis. Heart size is normal.  Hepatobiliary: No focal liver abnormality is seen. Status post cholecystectomy. There is persistent fluid collection in the gallbladder fossa, measuring 2.1 x 5.5 centimeters.  Pancreas: Unremarkable. No pancreatic ductal dilatation or surrounding inflammatory changes.  Spleen: A 7 millimeter hypodense lesion is identified in the MEDIAL spleen. A 3 millimeter hyperdense lesion is  identified in the LATERAL spleen. A 4 millimeter nodule is identified in the LOWER aspect of the spleen. Spleen is normal in size.  Adrenals/Urinary Tract: Adrenal glands are normal. A 1.4 centimeter  low-attenuation lesion within the midpole of the RIGHT kidney is 54 Hounsfield units, indeterminate for cyst. No hydronephrosis. Ureters are unremarkable. Prominent impression of the prostate upon the urinary bladder. Otherwise the bladder is unremarkable.  Stomach/Bowel: Moderate hiatal hernia. Stomach is otherwise normal in appearance. Small bowel loops are unremarkable. There are innumerable colonic diverticula, particularly involving the sigmoid segment where there is hypertrophy of the wall. No evidence for acute diverticulitis. The appendix is well seen and has a normal appearance.  Vascular/Lymphatic: There is atherosclerotic calcification of the abdominal aorta. No associated aneurysm. No retroperitoneal or mesenteric adenopathy.  Reproductive: Enlarged prostate.  Other: Edema identified in the region of the umbilicus, likely indicating recent laparoscopy. No ascites.  Musculoskeletal: No suspicious lytic or blastic lesions. Degenerative changes in the lumbar spine. Significant disc height loss and uncovertebral spurring at L2-3.  IMPRESSION: 1. Status post cholecystectomy. Persistent fluid collection in the gallbladder fossa, measuring 2.1 x 5.5 cm. Findings may be related to postoperative change. Early infection is not excluded. The absence of ascites makes a bile leak less likely. 2. Small splenic lesions, too small to characterize. 3. Moderate hiatal hernia. 4. Coronary artery disease. 5. Enlarged prostate gland. 6. Significant diverticular disease without evidence for acute diverticulitis. 7. Degenerative changes in the lumbar spine. 8. Aortic Atherosclerosis (ICD10-I70.0).  Electronically Signed: By: Nolon Nations M.D. On: 08/13/2018  19:32  Assessment/Plan:  78 y.o. male with GI bleeding, complicated by pertinent comorbidities including A. fib on anticoagulation, recent laparoscopic cholecystectomy, diverticulosis, hemorrhoids, COPD, diabetes mellitus, hyperlipidemia, hypertension and history of stroke. Patient with recent GI bleeding.  Patient has multiple presents to have GI bleeding like history of AVM, diverticulosis and hemorrhoids.  Being anticoagulated patient has an increased risk of bleeding.  I personally evaluated the images of the CT scan and evaluated the report from his surgical interventions 2 weeks ago.  On the report the surgeon described that he had spillage of bile and he thoroughly irrigated the liver area.  He also used Ethicon surgical snow on the hepatic bed.  The small fluid collections are normal after cholecystectomy.  Also the surgical's note can still be confused with a fluid collection even though is not a true fluid collection just the appearance of the anesthetic agent.  And I think that this fluid collection is significant enough to be the cause of his hemoglobin drop.  At this moment I will not recommend any further imaging.  I do not recommend drainage of the collection at this moment.  Patient does not have abdominal pain, tolerating diet and stable.  Also patient with normal alkaline phosphatase and bilirubin, bile leak is not likely.  Agree with current management.  Will remain aware in case I can be of assistance in this case.  Arnold Long, MD

## 2018-08-15 ENCOUNTER — Other Ambulatory Visit: Payer: Self-pay | Admitting: Oncology

## 2018-08-15 DIAGNOSIS — D500 Iron deficiency anemia secondary to blood loss (chronic): Secondary | ICD-10-CM

## 2018-08-15 DIAGNOSIS — D62 Acute posthemorrhagic anemia: Secondary | ICD-10-CM

## 2018-08-15 LAB — GLUCOSE, CAPILLARY
Glucose-Capillary: 97 mg/dL (ref 70–99)
Glucose-Capillary: 112 mg/dL — ABNORMAL HIGH (ref 70–99)

## 2018-08-15 LAB — CBC
HCT: 24.3 % — ABNORMAL LOW (ref 39.0–52.0)
Hemoglobin: 8.1 g/dL — ABNORMAL LOW (ref 13.0–17.0)
MCH: 29.6 pg (ref 26.0–34.0)
MCHC: 33.3 g/dL (ref 30.0–36.0)
MCV: 88.7 fL (ref 80.0–100.0)
Platelets: 205 10*3/uL (ref 150–400)
RBC: 2.74 MIL/uL — ABNORMAL LOW (ref 4.22–5.81)
RDW: 14.8 % (ref 11.5–15.5)
WBC: 7.2 10*3/uL (ref 4.0–10.5)
nRBC: 0.3 % — ABNORMAL HIGH (ref 0.0–0.2)

## 2018-08-15 MED ORDER — FERROUS SULFATE 325 (65 FE) MG PO TABS: 325.0000 mg | ORAL_TABLET | Freq: Two times a day (BID) | ORAL | Status: DC

## 2018-08-15 MED ORDER — FERROUS SULFATE 325 (65 FE) MG PO TABS: 325.0000 mg | ORAL_TABLET | Freq: Two times a day (BID) | ORAL | 3 refills | Status: DC

## 2018-08-15 MED ORDER — TRAMADOL HCL 50 MG PO TABS: 50.0000 mg | ORAL_TABLET | Freq: Four times a day (QID) | ORAL | 0 refills | Status: DC | PRN

## 2018-08-15 NOTE — Plan of Care (Signed)
  Problem: Bowel/Gastric: Goal: Will show no signs and symptoms of gastrointestinal bleeding Outcome: Progressing   Problem: Fluid Volume: Goal: Will show no signs and symptoms of excessive bleeding Outcome: Progressing   Problem: Clinical Measurements: Goal: Complications related to the disease process, condition or treatment will be avoided or minimized Outcome: Progressing   

## 2018-08-15 NOTE — Progress Notes (Signed)
Hematology/Oncology Progress Note [facility] [phone] [phone]  Patient Care Team: Elby Beck, FNP as PCP - General (Nurse Practitioner)   Name of the patient: Gabriel Brewer  [id number]  15-Mar-1940  Date of visit: 08/15/18   INTERVAL HISTORY-  Patient is lying in bed.  No acute overnight events.   No new complaints. Fatigue is better.     Review of systems- Review of Systems  Constitutional: Positive for fatigue.  Respiratory: Negative for cough and shortness of breath.   Gastrointestinal: Negative for abdominal pain and blood in stool.  Genitourinary: Negative for frequency.   Musculoskeletal: Negative for arthralgias.  Skin: Negative for rash.  Neurological: Negative for dizziness.  Psychiatric/Behavioral: Negative for confusion.    No Known Allergies  Patient Active Problem List   Diagnosis Date Noted  . Angiodysplasia of stomach and duodenum with hemorrhage   . Melena   . GI bleed 08/13/2018  . Acute blood loss anemia   . Iron deficiency anemia 11/14/2017  . Elevated prostate specific antigen (PSA) 11/12/2017  . Esophageal reflux 11/12/2017  . Family history of malignant neoplasm of prostate 11/12/2017  . Low back pain 11/12/2017  . Vitamin D deficiency 11/12/2017  . Pneumonia 11/03/2016  . Endocarditis of mitral valve 09/27/2015  . Mitral regurgitation 09/27/2015  . Essential hypertension 08/18/2015  . Skin cancer of face 02/06/2015  . Mixed hyperlipidemia 10/22/2014     Past Medical History:  Diagnosis Date  . Cancer ([place]) 2017   skin   . COPD (chronic obstructive pulmonary disease) ([place])   . Diabetes mellitus without complication ([place])   . GERD (gastroesophageal reflux disease)   . Hyperlipidemia   . Hypertension   . Mitral regurgitation   . PAF (paroxysmal atrial fibrillation) ([place])   . Stroke [facility])      Past Surgical History:  Procedure Laterality Date  . CHOLECYSTECTOMY N/A 07/30/2018    Procedure: LAPAROSCOPIC CHOLECYSTECTOMY WITH INTRAOPERATIVE CHOLANGIOGRAM;  Surgeon: Greer Pickerel, MD;  Location: [facility] ORS;  Service: General;  Laterality: N/A;  . COLONOSCOPY WITH PROPOFOL N/A 11/17/2017   Procedure: COLONOSCOPY WITH PROPOFOL;  Surgeon: Jonathon Bellows, MD;  Location: [facility] ENDOSCOPY;  Service: Gastroenterology;  Laterality: N/A;  . ESOPHAGOGASTRODUODENOSCOPY (EGD) WITH PROPOFOL N/A 11/17/2017   Procedure: ESOPHAGOGASTRODUODENOSCOPY (EGD) WITH PROPOFOL;  Surgeon: Jonathon Bellows, MD;  Location: [facility] ENDOSCOPY;  Service: Gastroenterology;  Laterality: N/A;  . GIVENS CAPSULE STUDY N/A 12/09/2017   Procedure: GIVENS CAPSULE STUDY;  Surgeon: Jonathon Bellows, MD;  Location: [facility] ENDOSCOPY;  Service: Gastroenterology;  Laterality: N/A;  . TONSILLECTOMY      Social History   Socioeconomic History  . Marital status: Married    Spouse name: Not on file  . Number of children: Not on file  . Years of education: Not on file  . Highest education level: Not on file  Occupational History  . Occupation: retired  Scientific laboratory technician  . Financial resource strain: Not on file  . Food insecurity    Worry: Not on file    Inability: Not on file  . Transportation needs    Medical: Not on file    Non-medical: Not on file  Tobacco Use  . Smoking status: Former Smoker    Packs/day: 0.50    Years: 20.00    Pack years: 10.00    Types: Cigarettes    Quit date: 11/20/1982    Years since quitting: 35.7  . Smokeless tobacco: Never Used  Substance and Sexual Activity  . Alcohol  use: No  . Drug use: No  . Sexual activity: Not Currently    Partners: Female  Lifestyle  . Physical activity    Days per week: Not on file    Minutes per session: Not on file  . Stress: Not on file  Relationships  . Social Herbalist on phone: Not on file    Gets together: Not on file    Attends religious service: Not on file    Active member of club or organization: Not on file    Attends meetings of clubs or  organizations: Not on file    Relationship status: Not on file  . Intimate partner violence    Fear of current or ex partner: Not on file    Emotionally abused: Not on file    Physically abused: Not on file    Forced sexual activity: Not on file  Other Topics Concern  . Not on file  Social History Narrative  . Not on file     Family History  Problem Relation Age of Onset  . Cerebral aneurysm Mother   . Heart disease Father   . COPD Father   . Breast cancer Sister   . Cancer Brother        [id number]     Current Facility-Administered Medications:  .  acetaminophen (TYLENOL) tablet 650 mg, 650 mg, Oral, Q6H PRN **OR** acetaminophen (TYLENOL) suppository 650 mg, 650 mg, Rectal, Q6H PRN, Seals, Angela H, NP .  atorvastatin (LIPITOR) tablet 10 mg, 10 mg, Oral, Daily, Seals, Angela H, NP, 10 mg at 08/14/18 1706 .  cholecalciferol (VITAMIN D3) tablet 4,000 Units, 4,000 Units, Oral, Q Fri, Seals, Juliaetta H, NP, 4,000 Units at 08/14/18 [phone] .  diltiazem (CARDIZEM CD) 24 hr capsule 180 mg, 180 mg, Oral, Daily, Seals, Angela H, NP, 180 mg at 08/15/18 1013 .  ferrous sulfate tablet 325 mg, 325 mg, Oral, BID WC, Vaughan Basta, MD .  insulin aspart (novoLOG) injection 0-5 Units, 0-5 Units, Subcutaneous, QHS, Seals, Angela H, NP .  insulin aspart (novoLOG) injection 0-9 Units, 0-9 Units, Subcutaneous, TID WC, Seals, Angela H, NP .  ondansetron (ZOFRAN) tablet 4 mg, 4 mg, Oral, Q6H PRN **OR** ondansetron (ZOFRAN) injection 4 mg, 4 mg, Intravenous, Q6H PRN, Seals, Angela H, NP .  pantoprazole (PROTONIX) 80 mg in sodium chloride 0.9 % 250 mL (0.32 mg/mL) infusion, 8 mg/hr, Intravenous, Continuous, [place], [place], MD, Last Rate: 25 mL/hr at 08/15/18 1035, 8 mg/hr at 08/15/18 1035 .  [START ON 08/16/2018] pantoprazole (PROTONIX) injection 40 mg, 40 mg, Intravenous, Q12H, Alfred Levins, [place], MD .  traMADol (ULTRAM) tablet 50 mg, 50 mg, Oral, Q6H PRN, Demetrios Loll, MD .  traZODone (DESYREL)  tablet 50 mg, 50 mg, Oral, QHS PRN, Mayer Camel, NP   Physical exam:  Vitals:   08/14/18 1601 08/14/18 1942 08/15/18 0506 08/15/18 0821  BP: 122/68 128/84 132/68 136/73  Pulse: 79 84 80 83  Resp: 18   18  Temp: 97.9 F (36.6 C) 98 F (36.7 C) 98.3 F (36.8 C) 98.3 F (36.8 C)  TempSrc: Oral Oral Oral Oral  SpO2: 99% 100% 100% 100%  Weight:      Height:       Physical Exam  Constitutional: He is oriented to person, place, and time. No distress.  HENT:  Head: Normocephalic and atraumatic.  Nose: Nose normal.  Mouth/Throat: Oropharynx is clear and moist. No oropharyngeal exudate.  Eyes: Pupils are equal, round, and reactive  to light. EOM are normal. No scleral icterus.  Neck: Normal range of motion. Neck supple.  Cardiovascular: Normal rate and regular rhythm.  No murmur heard. Pulmonary/Chest: Effort normal. No respiratory distress. He has no rales. He exhibits no tenderness.  Abdominal: Soft. He exhibits no distension. There is no abdominal tenderness.  Musculoskeletal: Normal range of motion.        General: No edema.  Neurological: He is alert and oriented to person, place, and time. No cranial nerve deficit. He exhibits normal muscle tone. Coordination normal.  Skin: Skin is warm and dry. He is not diaphoretic. No erythema. There is pallor.  Psychiatric: Affect normal.       CMP Latest Ref Rng & Units 08/13/2018  Glucose 70 - 99 mg/dL 123(H)  BUN 8 - 23 mg/dL 26(H)  Creatinine 0.61 - 1.24 mg/dL 1.04  Sodium 135 - 145 mmol/L 138  Potassium 3.5 - 5.1 mmol/L 3.9  Chloride 98 - 111 mmol/L 109  CO2 22 - 32 mmol/L 21(L)  Calcium 8.9 - 10.3 mg/dL 8.5(L)  Total Protein 6.5 - 8.1 g/dL 5.9(L)  Total Bilirubin 0.3 - 1.2 mg/dL 1.1  Alkaline Phos 38 - 126 U/L 53  AST 15 - 41 U/L 17  ALT 0 - 44 U/L 16   CBC Latest Ref Rng & Units 08/15/2018  WBC 4.0 - 10.5 K/uL 7.2  Hemoglobin 13.0 - 17.0 g/dL 8.1(L)  Hematocrit 39.0 - 52.0 % 24.3(L)  Platelets 150 - 400 K/uL 205    RADIOGRAPHIC STUDIES: I have personally reviewed the radiological images as listed and agreed with the findings in the report.  Dg Chest 1 View  Result Date: 08/13/2018 CLINICAL DATA:  Weakness EXAM: CHEST  1 VIEW COMPARISON:  03/13/2018, 11/29/2017, CT 11/05/2016 FINDINGS: Hyperinflation with emphysematous disease. No acute consolidation or pleural effusion. Stable cardiomediastinal silhouette. No pneumothorax. Apically pleural thickening. Small stellate focus in the right suprahilar lung. IMPRESSION: No active disease. Small stellate opacity in the right suprahilar lung either representing summation shadow versus small nodule. Suggest short interval two-view radiographic follow-up Electronically Signed   By: Donavan Foil M.D.   On: 08/13/2018 01:18   Dg Cholangiogram Operative  Result Date: 07/30/2018 CLINICAL DATA:  Intraoperative cholangiogram during laparoscopic cholecystectomy. EXAM: INTRAOPERATIVE CHOLANGIOGRAM FLUOROSCOPY TIME:  10 seconds (2.34 mGy) COMPARISON:  Abdominal ultrasound-03/16/2018 FINDINGS: Intraoperative cholangiographic images of the right upper abdominal quadrant during laparoscopic cholecystectomy are provided for review. Surgical clips overlie the expected location of the gallbladder fossa. Contrast injection demonstrates selective cannulation of the central aspect of the cystic duct. There is passage of contrast through the central aspect of the cystic duct with filling of a non dilated common bile duct. There is passage of contrast though the CBD and into the descending portion of the duodenum. There is minimal reflux of injected contrast into the common hepatic duct and central aspect of the non dilated intrahepatic biliary system. Transient mobile filling defect within the common hepatic duct is favored to represent an air bubble. There are no discrete filling defects within the opacified portions of the biliary system to suggest the presence of choledocholithiasis.  IMPRESSION: No definite evidence of choledocholithiasis. Electronically Signed   By: Sandi Mariscal M.D.   On: 07/30/2018 15:50   Ct Abdomen Pelvis W Contrast  Addendum Date: 08/14/2018   ADDENDUM REPORT: 08/14/2018 09:59 ADDENDUM: 1.4 centimeter lesion in the mid pole of the RIGHT kidney is indeterminate. Recommend further evaluation with renal protocol MRI or CT. Electronically Signed  By: Nolon Nations M.D.   On: 08/14/2018 09:59   Result Date: 08/14/2018 CLINICAL DATA:  Per MD note: 78 y.o. y/o male with recent cholecystectomy 2 weeks ago, admitted with melena and anemia, with previous history of iron deficiency anemia and non bleeding AVMs seen in small bowel capsule endoscopy in December 2019. Patient on Xarelto. EXAM: CT ABDOMEN AND PELVIS WITH CONTRAST TECHNIQUE: Multidetector CT imaging of the abdomen and pelvis was performed using the standard protocol following bolus administration of intravenous contrast. CONTRAST:  147mL OMNIPAQUE IOHEXOL 300 MG/ML  SOLN COMPARISON:  CT of the chest, abdomen, pelvis on 09/16/2015 FINDINGS: Lower chest: Minimal LEFT LOWER lobe atelectasis. There is coronary artery atherosclerosis. Heart size is normal. Hepatobiliary: No focal liver abnormality is seen. Status post cholecystectomy. There is persistent fluid collection in the gallbladder fossa, measuring 2.1 x 5.5 centimeters. Pancreas: Unremarkable. No pancreatic ductal dilatation or surrounding inflammatory changes. Spleen: A 7 millimeter hypodense lesion is identified in the MEDIAL spleen. A 3 millimeter hyperdense lesion is identified in the LATERAL spleen. A 4 millimeter nodule is identified in the LOWER aspect of the spleen. Spleen is normal in size. Adrenals/Urinary Tract: Adrenal glands are normal. A 1.4 centimeter low-attenuation lesion within the midpole of the RIGHT kidney is 54 Hounsfield units, indeterminate for cyst. No hydronephrosis. Ureters are unremarkable. Prominent impression of the prostate upon  the urinary bladder. Otherwise the bladder is unremarkable. Stomach/Bowel: Moderate hiatal hernia. Stomach is otherwise normal in appearance. Small bowel loops are unremarkable. There are innumerable colonic diverticula, particularly involving the sigmoid segment where there is hypertrophy of the wall. No evidence for acute diverticulitis. The appendix is well seen and has a normal appearance. Vascular/Lymphatic: There is atherosclerotic calcification of the abdominal aorta. No associated aneurysm. No retroperitoneal or mesenteric adenopathy. Reproductive: Enlarged prostate. Other: Edema identified in the region of the umbilicus, likely indicating recent laparoscopy. No ascites. Musculoskeletal: No suspicious lytic or blastic lesions. Degenerative changes in the lumbar spine. Significant disc height loss and uncovertebral spurring at L2-3. IMPRESSION: 1. Status post cholecystectomy. Persistent fluid collection in the gallbladder fossa, measuring 2.1 x 5.5 cm. Findings may be related to postoperative change. Early infection is not excluded. The absence of ascites makes a bile leak less likely. 2. Small splenic lesions, too small to characterize. 3. Moderate hiatal hernia. 4. Coronary artery disease. 5. Enlarged prostate gland. 6. Significant diverticular disease without evidence for acute diverticulitis. 7. Degenerative changes in the lumbar spine. 8. Aortic Atherosclerosis (ICD10-I70.0). Electronically Signed: By: Nolon Nations M.D. On: 08/13/2018 19:32    Assessment and plan-  Patient is a 78 y.o. male with history of iron deficiency, small bowel AVM, recent cholecystectomy, presented to emergency room for evaluation of worsening weakness and malaise, dark stool.  #Severe iron deficiency anemia Status post PRBC transfusion yesterday, also IV Venofer 200 mg x 3 doses.  Hemoglobin 8.1 today. Recommend oral iron supplementation ferrous sulfate 325mg  Bid.  Follow up outpatient with me in 7-10 days with  repeat blood work to ensure hemoglobin being stable.   # GI bleeding s/p small bowel enteroscopy.  Angiodysplasia of stomach and duodenum with hemorrhage, treated.  Continue hold Xalreto  Patient needs to follow up with cardiology for discussion of anticoagulation.   Thank you for allowing me to participate in the care of this patient.   Earlie Server, MD, PhD Hematology Oncology [facility] Pager- [phone] 08/15/2018

## 2018-08-15 NOTE — Progress Notes (Signed)
Gabriel Brewer , MD [address] [place], [place], [place], [address] [address], [place], [place], [place], [address] Phone: [phone]  Fax: [phone]   [name] is being followed for GI bleed   Subjective: Denies any abdominal pain, denies any bowel movements.  He said last food bowel movement was over 2 days back.  Feels well has been consuming beef broth and taking it down okay.   Objective: Vital signs in last 24 hours: Vitals:   08/14/18 1601 08/14/18 1942 08/15/18 0506 08/15/18 0821  BP: 122/68 128/84 132/68 136/73  Pulse: 79 84 80 83  Resp: 18   18  Temp: 97.9 F (36.6 C) 98 F (36.7 C) 98.3 F (36.8 C) 98.3 F (36.8 C)  TempSrc: Oral Oral Oral Oral  SpO2: 99% 100% 100% 100%  Weight:      Height:       Weight change:   Intake/Output Summary (Last 24 hours) at 08/15/2018 0947 Last data filed at 08/15/2018 5102 Gross per 24 hour  Intake 802.8 ml  Output 0 ml  Net 802.8 ml     Exam: Heart:: Regular rate and rhythm, S1S2 present or without murmur or extra heart sounds Lungs: normal, clear to auscultation and clear to auscultation and percussion Abdomen: soft, nontender, normal bowel sounds   Lab Results: @LABTEST2 @ Micro Results: Recent Results (from the past 240 hour(s))  Novel Coronavirus,NAA,(SEND-OUT TO REF LAB - TAT 24-48 hrs); Hosp Order     Status: None   Collection Time: 08/13/18  2:31 AM   Specimen: Nasopharyngeal Swab; Respiratory  Result Value Ref Range Status   SARS-CoV-2, NAA NOT DETECTED NOT DETECTED Final    Comment: (NOTE) This test was developed and its performance characteristics determined by [employer]. This test has not been FDA cleared or approved. This test has been authorized by FDA under an Emergency Use Authorization (EUA). This test is only authorized for the duration of time the declaration that circumstances exist justifying the authorization of the emergency use of in vitro diagnostic tests for  detection of SARS-CoV-2 virus and/or diagnosis of COVID-19 infection under section 564(b)(1) of the Act, 21 U.S.C. 585IDP-8(E)(4), unless the authorization is terminated or revoked sooner. When diagnostic testing is negative, the possibility of a false negative result should be considered in the context of a patient's recent exposures and the presence of clinical signs and symptoms consistent with COVID-19. An individual without symptoms of COVID-19 and who is not shedding SARS-CoV-2 virus would expect to have a negative (not detected) result in this assay. Performed  At: [facility] [address] [place], [place] [id number] Rush Farmer MD XV:[id number]    [place]  Final    Comment: Performed at [facility], [address] [place], [place] [address]  SARS Coronavirus 2 [facility] order, Performed in [facility] hospital lab) Nasopharyngeal Nasopharyngeal Swab     Status: None   Collection Time: 08/14/18 11:49 AM   Specimen: Nasopharyngeal Swab  Result Value Ref Range Status   SARS Coronavirus 2 NEGATIVE NEGATIVE Final    Comment: (NOTE) If result is NEGATIVE SARS-CoV-2 target nucleic acids are NOT DETECTED. The SARS-CoV-2 RNA is generally detectable in upper and lower  respiratory specimens during the acute phase of infection. The lowest  concentration of SARS-CoV-2 viral copies this assay can detect is 250  copies / mL. A negative result does not preclude SARS-CoV-2 infection  and should not be used as the sole basis for treatment or  other  patient management decisions.  A negative result may occur with  improper specimen collection / handling, submission of specimen other  than nasopharyngeal swab, presence of viral mutation(s) within the  areas targeted by this assay, and inadequate number of viral copies  (<250 copies / mL). A negative result must be combined with clinical  observations, patient history, and epidemiological  information. If result is POSITIVE SARS-CoV-2 target nucleic acids are DETECTED. The SARS-CoV-2 RNA is generally detectable in upper and lower  respiratory specimens dur ing the acute phase of infection.  Positive  results are indicative of active infection with SARS-CoV-2.  Clinical  correlation with patient history and other diagnostic information is  necessary to determine patient infection status.  Positive results do  not rule out bacterial infection or co-infection with other viruses. If result is PRESUMPTIVE POSTIVE SARS-CoV-2 nucleic acids MAY BE PRESENT.   A presumptive positive result was obtained on the submitted specimen  and confirmed on repeat testing.  While 2019 novel coronavirus  (SARS-CoV-2) nucleic acids may be present in the submitted sample  additional confirmatory testing may be necessary for epidemiological  and / or clinical management purposes  to differentiate between  SARS-CoV-2 and other Sarbecovirus currently known to infect humans.  If clinically indicated additional testing with an alternate test  methodology [phone]) is advised. The SARS-CoV-2 RNA is generally  detectable in upper and lower respiratory sp ecimens during the acute  phase of infection. The expected result is Negative. Fact Sheet for Patients:  StrictlyIdeas.no Fact Sheet for Healthcare Providers: BankingDealers.co.za This test is not yet approved or cleared by the [place] FDA and has been authorized for detection and/or diagnosis of SARS-CoV-2 by FDA under an Emergency Use Authorization (EUA).  This EUA will remain in effect (meaning this test can be used) for the duration of the COVID-19 declaration under Section 564(b)(1) of the Act, 21 U.S.C. section 360bbb-3(b)(1), unless the authorization is terminated or revoked sooner. Performed at [facility], [address] [place], [place] [address]    Studies/Results: Ct  Abdomen Pelvis W Contrast  Addendum Date: 08/14/2018   ADDENDUM REPORT: 08/14/2018 09:59 ADDENDUM: 1.4 centimeter lesion in the mid pole of the RIGHT kidney is indeterminate. Recommend further evaluation with renal protocol MRI or CT. Electronically Signed   By: Nolon Nations M.D.   On: 08/14/2018 09:59   Result Date: 08/14/2018 CLINICAL DATA:  Per MD note: 78 y.o. y/o male with recent cholecystectomy 2 weeks ago, admitted with melena and anemia, with previous history of iron deficiency anemia and non bleeding AVMs seen in small bowel capsule endoscopy in December 2019. Patient on Xarelto. EXAM: CT ABDOMEN AND PELVIS WITH CONTRAST TECHNIQUE: Multidetector CT imaging of the abdomen and pelvis was performed using the standard protocol following bolus administration of intravenous contrast. CONTRAST:  124mL OMNIPAQUE IOHEXOL 300 MG/ML  SOLN COMPARISON:  CT of the chest, abdomen, pelvis on 09/16/2015 FINDINGS: Lower chest: Minimal LEFT LOWER lobe atelectasis. There is coronary artery atherosclerosis. Heart size is normal. Hepatobiliary: No focal liver abnormality is seen. Status post cholecystectomy. There is persistent fluid collection in the gallbladder fossa, measuring 2.1 x 5.5 centimeters. Pancreas: Unremarkable. No pancreatic ductal dilatation or surrounding inflammatory changes. Spleen: A 7 millimeter hypodense lesion is identified in the MEDIAL spleen. A 3 millimeter hyperdense lesion is identified in the LATERAL spleen. A 4 millimeter nodule is identified in the LOWER aspect of the spleen. Spleen is normal in size. Adrenals/Urinary Tract: Adrenal glands are normal.  A 1.4 centimeter low-attenuation lesion within the midpole of the RIGHT kidney is 54 Hounsfield units, indeterminate for cyst. No hydronephrosis. Ureters are unremarkable. Prominent impression of the prostate upon the urinary bladder. Otherwise the bladder is unremarkable. Stomach/Bowel: Moderate hiatal hernia. Stomach is otherwise normal in  appearance. Small bowel loops are unremarkable. There are innumerable colonic diverticula, particularly involving the sigmoid segment where there is hypertrophy of the wall. No evidence for acute diverticulitis. The appendix is well seen and has a normal appearance. Vascular/Lymphatic: There is atherosclerotic calcification of the abdominal aorta. No associated aneurysm. No retroperitoneal or mesenteric adenopathy. Reproductive: Enlarged prostate. Other: Edema identified in the region of the umbilicus, likely indicating recent laparoscopy. No ascites. Musculoskeletal: No suspicious lytic or blastic lesions. Degenerative changes in the lumbar spine. Significant disc height loss and uncovertebral spurring at L2-3. IMPRESSION: 1. Status post cholecystectomy. Persistent fluid collection in the gallbladder fossa, measuring 2.1 x 5.5 cm. Findings may be related to postoperative change. Early infection is not excluded. The absence of ascites makes a bile leak less likely. 2. Small splenic lesions, too small to characterize. 3. Moderate hiatal hernia. 4. Coronary artery disease. 5. Enlarged prostate gland. 6. Significant diverticular disease without evidence for acute diverticulitis. 7. Degenerative changes in the lumbar spine. 8. Aortic Atherosclerosis (ICD10-I70.0). Electronically Signed: By: Nolon Nations M.D. On: 08/13/2018 19:32   Medications: I have reviewed the patient's current medications. Scheduled Meds:  atorvastatin  10 mg Oral Daily   cholecalciferol  4,000 Units Oral Q Fri   diltiazem  180 mg Oral Daily   insulin aspart  0-5 Units Subcutaneous QHS   insulin aspart  0-9 Units Subcutaneous TID WC   [START ON 08/16/2018] pantoprazole  40 mg Intravenous Q12H   Continuous Infusions:  iron sucrose 200 mg (08/14/18 0848)   pantoprozole (PROTONIX) infusion 8 mg/hr (08/15/18 0016)   PRN Meds:.acetaminophen **OR** acetaminophen, ondansetron **OR** ondansetron (ZOFRAN) IV, traMADol,  traZODone   Assessment: Active Problems:   GI bleed   Acute blood loss anemia   Angiodysplasia of stomach and duodenum with hemorrhage   Melena  He has been admitted with anemia.  He was on Xarelto.  Previously evaluated for anemia by myself in 2019 and had a proximal small bowel AVM that was not treated as he was not followed up.  Underwent a push enteroscopy yesterday and was found to have a bleeding duodenal AVM that was cauterized.  He has recently been on NSAIDs in addition to Xarelto which might have precipitated the bleed.  Is s/p cholecystectomy.  No overt bleeding at this point of time.  Plan: 1.  If hemoglobin is stable which has not been checked today can advance diet and discharge from GI point of view. 2.  Suggest to repeat CBC in about a week's time to ensure stable. 3.  Ensure he is on PPI long-term if he resumes his Xarelto.  If plan to resume Xarelto would ideally like to wait till at least his next CBC in a week's time if stable.  If aspirin needs to be started I would not have any problem doing so at the time of discharge.   LOS: 2 days   Gabriel Bellows, MD 08/15/2018, 9:47 AM

## 2018-08-15 NOTE — Progress Notes (Signed)
Went over discharge instructions with the patient including new medications and follow-up appointment. Levada Dy, RN discontinue PIV and telemetry monitor. Patient is waiting for his ride.

## 2018-08-15 NOTE — Discharge Summary (Signed)
[facility] [place] NAME: Gabriel Brewer    MR#:  [id number]  DATE OF BIRTH:  06-09-1940  DATE OF ADMISSION:  08/13/2018 ADMITTING PHYSICIAN: Christel Mormon, MD  DATE OF DISCHARGE: 08/15/2018   PRIMARY CARE PHYSICIAN: Elby Beck, FNP    ADMISSION DIAGNOSIS:  Acute blood loss anemia [D62] Weakness [R53.1] UGIB (upper gastrointestinal bleed) [K92.2]  DISCHARGE DIAGNOSIS:  Active Problems:   GI bleed   Acute blood loss anemia   Angiodysplasia of stomach and duodenum with hemorrhage   Melena   SECONDARY DIAGNOSIS:   Past Medical History:  Diagnosis Date  . Cancer ([place]) 2017   skin   . COPD (chronic obstructive pulmonary disease) ([place])   . Diabetes mellitus without complication ([place])   . GERD (gastroesophageal reflux disease)   . Hyperlipidemia   . Hypertension   . Mitral regurgitation   . PAF (paroxysmal atrial fibrillation) ([place])   . Stroke [facility])     HOSPITAL COURSE:  1.GI bleed The patient has been kept n.p.o. with IV fluid support.  He has been treated with IV Protonix.  CT abdomen: Status post cholecystectomy. Persistent fluid collection in the gallbladder fossa, no further imaging or procedure per Dr. Zachery Dauer. EGD done per Dr. Bonna Gains.  Which showed 1 angiectasia in duodenum.  Advised to hold Xarelto for at least 1 week and then decide the need of restarting. Patient tolerated diet and hemoglobin was reasonably stable. Advised with oral PPI at home. Follow with GI, oncology clinic for IV iron, cardiology clinic to restart anticoagulation.  2. Anemia, due to acute blood loss secondary to GI bleeding. The patient got 1 unit PRBC transfusion IV iron per Dr. Tasia Catchings. Hemoglobin increased to 9.3 but gradually drop up to 8.1. Advised to hold Xarelto. Follow in oncology clinic as outpatient for IV iron therapy.  Discharged with oral iron.  3. Generalized weakness This could be due to anemia.  Patient was  able to ambulate in the room.  4. History of atrial fibrillation on Xarelto -Telemetry monitoring Hold Xarelto pending hematology consultation with current GI bleed and anemia Advised to follow with his cardiologist in 1 to 2 weeks to decide timing of restarting Xarelto.  DISCHARGE CONDITIONS:   Stable.  CONSULTS OBTAINED:  Treatment Team:  Jonathon Bellows, MD  DRUG ALLERGIES:  No Known Allergies  DISCHARGE MEDICATIONS:   Allergies as of 08/15/2018   No Known Allergies     Medication List    STOP taking these medications   amoxicillin 500 MG capsule Commonly known as: AMOXIL   benazepril 40 MG tablet Commonly known as: LOTENSIN   rivaroxaban 20 MG Tabs tablet Commonly known as: XARELTO     TAKE these medications   atorvastatin 10 MG tablet Commonly known as: LIPITOR Take 1 tablet (10 mg total) by mouth daily.   Cholecalciferol 100 MCG (4000 UT) Caps Take 4,000 Units by mouth every Friday.   diltiazem 180 MG 24 hr capsule Commonly known as: CARDIZEM CD Take 1 capsule (180 mg total) by mouth daily.   ferrous sulfate 325 (65 FE) MG tablet Take 1 tablet (325 mg total) by mouth 2 (two) times daily with a meal.   fluticasone 50 MCG/ACT nasal spray Commonly known as: FLONASE Place 1 spray into both nostrils daily as needed for allergies.   hydrochlorothiazide 12.5 MG tablet Commonly known as: HYDRODIURIL Take 12.5 mg by mouth daily.   metFORMIN 500 MG tablet Commonly known as: GLUCOPHAGE  TAKE 1 TABLET(500 MG) BY MOUTH TWICE DAILY What changed: See the new instructions.   omeprazole 40 MG capsule Commonly known as: PRILOSEC Take 1 capsule (40 mg total) by mouth daily.   traMADol 50 MG tablet Commonly known as: ULTRAM Take 1 tablet (50 mg total) by mouth every 6 (six) hours as needed for severe pain.        DISCHARGE INSTRUCTIONS:   Do not take Xarelto until advised by oncology or cardiology.  If you experience worsening of your admission symptoms,  develop shortness of breath, life threatening emergency, suicidal or homicidal thoughts you must seek medical attention immediately by calling 911 or calling your MD immediately  if symptoms less severe.  You Must read complete instructions/literature along with all the possible adverse reactions/side effects for all the Medicines you take and that have been prescribed to you. Take any new Medicines after you have completely understood and accept all the possible adverse reactions/side effects.   Please note  You were cared for by a hospitalist during your hospital stay. If you have any questions about your discharge medications or the care you received while you were in the hospital after you are discharged, you can call the unit and asked to speak with the hospitalist on call if the hospitalist that took care of you is not available. Once you are discharged, your primary care physician will handle any further medical issues. Please note that NO REFILLS for any discharge medications will be authorized once you are discharged, as it is imperative that you return to your primary care physician (or establish a relationship with a primary care physician if you do not have one) for your aftercare needs so that they can reassess your need for medications and monitor your lab values.    Today   CHIEF COMPLAINT:   Chief Complaint  Patient presents with  . Weakness    HISTORY OF PRESENT ILLNESS:  Gabriel Brewer  is a 78 y.o. male with a known history of diverticulosis, colon polyps, proximal small bowel AVM, and a history of hemorrhoids, as well as a history of atrial fibrillation currently on Xarelto, hypertension, diabetes.  Additionally, he has a history of iron deficiency anemia and receives iron transfusions at the cancer center followed by Dr. Tasia Catchings.  His last transfusion was received in January 2020.  He presented to the emergency room complaining of a 2-day history of generalized fatigue and malaise  having noticed a black stool earlier in the day.  Additionally, he also notes having been diagnosed with "prediabetes ".  He usually has glucose between 100 and 120.  He checked his glucose earlier today with glucose as high as 250.  He denies noticing increased shortness of breath.  He denies chest pain.  He denies fever, chills, nausea, vomiting.  He denies hematemesis.  He denies hematuria or hemoptysis.  He denies abdominal pain.  He is status post cholecystectomy 2 weeks ago with no complications since then.  On arrival, hematocrit is 27.4 with hemoglobin 9.2.  Glucose is 159.  INR is 1.1.  Hemoglobin A1c is 5.4.  COVID-19 testing was -2 weeks ago.  Repeat testing is pending results.  He is receiving 1 unit packed red blood cells in the emergency room.  He is being admitted for anemia and GI bleed with generalized weakness and history of iron deficiency anemia.   VITAL SIGNS:  Blood pressure 136/73, pulse 83, temperature 98.3 F (36.8 C), temperature source Oral, resp. rate 18, height  5\' 11"  (1.803 m), weight 76 kg, SpO2 100 %.  I/O:    Intake/Output Summary (Last 24 hours) at 08/15/2018 1351 Last data filed at 08/15/2018 1326 Gross per 24 hour  Intake 1162.8 ml  Output 0 ml  Net 1162.8 ml    PHYSICAL EXAMINATION:  GENERAL:  78 y.o.-year-old patient lying in the bed with no acute distress.  EYES: Pupils equal, round, reactive to light and accommodation. No scleral icterus. Extraocular muscles intact.  HEENT: Head atraumatic, normocephalic. Oropharynx and nasopharynx clear.  NECK:  Supple, no jugular venous distention. No thyroid enlargement, no tenderness.  LUNGS: Normal breath sounds bilaterally, no wheezing, rales,rhonchi or crepitation. No use of accessory muscles of respiration.  CARDIOVASCULAR: S1, S2 normal. No murmurs, rubs, or gallops.  ABDOMEN: Soft, non-tender, non-distended. Bowel sounds present. No organomegaly or mass.  EXTREMITIES: No pedal edema, cyanosis, or clubbing.   NEUROLOGIC: Cranial nerves II through XII are intact. Muscle strength 5/5 in all extremities. Sensation intact. Gait not checked.  PSYCHIATRIC: The patient is alert and oriented x 3.  SKIN: No obvious rash, lesion, or ulcer.   DATA REVIEW:   CBC Recent Labs  Lab 08/15/18 1014  WBC 7.2  HGB 8.1*  HCT 24.3*  PLT 205    Chemistries  Recent Labs  Lab 08/13/18 1348  NA 138  K 3.9  CL 109  CO2 21*  GLUCOSE 123*  BUN 26*  CREATININE 1.04  CALCIUM 8.5*  AST 17  ALT 16  ALKPHOS 53  BILITOT 1.1    Cardiac Enzymes No results for input(s): TROPONINI in the last 168 hours.  Microbiology Results  Results for orders placed or performed during the hospital encounter of 08/13/18  Novel Coronavirus,NAA,(SEND-OUT TO REF LAB - TAT 24-48 hrs); Hosp Order     Status: None   Collection Time: 08/13/18  2:31 AM   Specimen: Nasopharyngeal Swab; Respiratory  Result Value Ref Range Status   SARS-CoV-2, NAA NOT DETECTED NOT DETECTED Final    Comment: (NOTE) This test was developed and its performance characteristics determined by [employer]. This test has not been FDA cleared or approved. This test has been authorized by FDA under an Emergency Use Authorization (EUA). This test is only authorized for the duration of time the declaration that circumstances exist justifying the authorization of the emergency use of in vitro diagnostic tests for detection of SARS-CoV-2 virus and/or diagnosis of COVID-19 infection under section 564(b)(1) of the Act, 21 U.S.C. 161WRU-0(A)(5), unless the authorization is terminated or revoked sooner. When diagnostic testing is negative, the possibility of a false negative result should be considered in the context of a patient's recent exposures and the presence of clinical signs and symptoms consistent with COVID-19. An individual without symptoms of COVID-19 and who is not shedding SARS-CoV-2 virus would expect to have a negative (not detected)  result in this assay. Performed  At: [facility] [address] [place], [place] [id number] Rush Farmer MD NW:[id number]    [place]  Final    Comment: Performed at [facility] [address] [place], [place] [address]  SARS Coronavirus 2 [facility] order, Performed in [facility] hospital lab) Nasopharyngeal Nasopharyngeal Swab     Status: None   Collection Time: 08/14/18 11:49 AM   Specimen: Nasopharyngeal Swab  Result Value Ref Range Status   SARS Coronavirus 2 NEGATIVE NEGATIVE Final    Comment: (NOTE) If result is NEGATIVE SARS-CoV-2 target nucleic acids are NOT DETECTED. The SARS-CoV-2 RNA is generally  detectable in upper and lower  respiratory specimens during the acute phase of infection. The lowest  concentration of SARS-CoV-2 viral copies this assay can detect is 250  copies / mL. A negative result does not preclude SARS-CoV-2 infection  and should not be used as the sole basis for treatment or other  patient management decisions.  A negative result may occur with  improper specimen collection / handling, submission of specimen other  than nasopharyngeal swab, presence of viral mutation(s) within the  areas targeted by this assay, and inadequate number of viral copies  (<250 copies / mL). A negative result must be combined with clinical  observations, patient history, and epidemiological information. If result is POSITIVE SARS-CoV-2 target nucleic acids are DETECTED. The SARS-CoV-2 RNA is generally detectable in upper and lower  respiratory specimens dur ing the acute phase of infection.  Positive  results are indicative of active infection with SARS-CoV-2.  Clinical  correlation with patient history and other diagnostic information is  necessary to determine patient infection status.  Positive results do  not rule out bacterial infection or co-infection with other viruses. If result is PRESUMPTIVE POSTIVE SARS-CoV-2  nucleic acids MAY BE PRESENT.   A presumptive positive result was obtained on the submitted specimen  and confirmed on repeat testing.  While 2019 novel coronavirus  (SARS-CoV-2) nucleic acids may be present in the submitted sample  additional confirmatory testing may be necessary for epidemiological  and / or clinical management purposes  to differentiate between  SARS-CoV-2 and other Sarbecovirus currently known to infect humans.  If clinically indicated additional testing with an alternate test  methodology [phone]) is advised. The SARS-CoV-2 RNA is generally  detectable in upper and lower respiratory sp ecimens during the acute  phase of infection. The expected result is Negative. Fact Sheet for Patients:  StrictlyIdeas.no Fact Sheet for Healthcare Providers: BankingDealers.co.za This test is not yet approved or cleared by the [place] FDA and has been authorized for detection and/or diagnosis of SARS-CoV-2 by FDA under an Emergency Use Authorization (EUA).  This EUA will remain in effect (meaning this test can be used) for the duration of the COVID-19 declaration under Section 564(b)(1) of the Act, 21 U.S.C. section 360bbb-3(b)(1), unless the authorization is terminated or revoked sooner. Performed at [facility], [address] [place], [place] [address]     RADIOLOGY:  Ct Abdomen Pelvis W Contrast  Addendum Date: 08/14/2018   ADDENDUM REPORT: 08/14/2018 09:59 ADDENDUM: 1.4 centimeter lesion in the mid pole of the RIGHT kidney is indeterminate. Recommend further evaluation with renal protocol MRI or CT. Electronically Signed   By: Nolon Nations M.D.   On: 08/14/2018 09:59   Result Date: 08/14/2018 CLINICAL DATA:  Per MD note: 78 y.o. y/o male with recent cholecystectomy 2 weeks ago, admitted with melena and anemia, with previous history of iron deficiency anemia and non bleeding AVMs seen in small bowel capsule  endoscopy in December 2019. Patient on Xarelto. EXAM: CT ABDOMEN AND PELVIS WITH CONTRAST TECHNIQUE: Multidetector CT imaging of the abdomen and pelvis was performed using the standard protocol following bolus administration of intravenous contrast. CONTRAST:  167mL OMNIPAQUE IOHEXOL 300 MG/ML  SOLN COMPARISON:  CT of the chest, abdomen, pelvis on 09/16/2015 FINDINGS: Lower chest: Minimal LEFT LOWER lobe atelectasis. There is coronary artery atherosclerosis. Heart size is normal. Hepatobiliary: No focal liver abnormality is seen. Status post cholecystectomy. There is persistent fluid collection in the gallbladder fossa, measuring 2.1 x 5.5 centimeters. Pancreas: Unremarkable.  No pancreatic ductal dilatation or surrounding inflammatory changes. Spleen: A 7 millimeter hypodense lesion is identified in the MEDIAL spleen. A 3 millimeter hyperdense lesion is identified in the LATERAL spleen. A 4 millimeter nodule is identified in the LOWER aspect of the spleen. Spleen is normal in size. Adrenals/Urinary Tract: Adrenal glands are normal. A 1.4 centimeter low-attenuation lesion within the midpole of the RIGHT kidney is 54 Hounsfield units, indeterminate for cyst. No hydronephrosis. Ureters are unremarkable. Prominent impression of the prostate upon the urinary bladder. Otherwise the bladder is unremarkable. Stomach/Bowel: Moderate hiatal hernia. Stomach is otherwise normal in appearance. Small bowel loops are unremarkable. There are innumerable colonic diverticula, particularly involving the sigmoid segment where there is hypertrophy of the wall. No evidence for acute diverticulitis. The appendix is well seen and has a normal appearance. Vascular/Lymphatic: There is atherosclerotic calcification of the abdominal aorta. No associated aneurysm. No retroperitoneal or mesenteric adenopathy. Reproductive: Enlarged prostate. Other: Edema identified in the region of the umbilicus, likely indicating recent laparoscopy. No ascites.  Musculoskeletal: No suspicious lytic or blastic lesions. Degenerative changes in the lumbar spine. Significant disc height loss and uncovertebral spurring at L2-3. IMPRESSION: 1. Status post cholecystectomy. Persistent fluid collection in the gallbladder fossa, measuring 2.1 x 5.5 cm. Findings may be related to postoperative change. Early infection is not excluded. The absence of ascites makes a bile leak less likely. 2. Small splenic lesions, too small to characterize. 3. Moderate hiatal hernia. 4. Coronary artery disease. 5. Enlarged prostate gland. 6. Significant diverticular disease without evidence for acute diverticulitis. 7. Degenerative changes in the lumbar spine. 8. Aortic Atherosclerosis (ICD10-I70.0). Electronically Signed: By: Nolon Nations M.D. On: 08/13/2018 19:32    EKG:   Orders placed or performed during the hospital encounter of 08/13/18  . ED EKG  . ED EKG  . EKG 12-Lead  . EKG 12-Lead      Management plans discussed with the patient, family and they are in agreement.  CODE STATUS:     Code Status Orders  (From admission, onward)         Start     Ordered   08/13/18 0201  Full code  Continuous     08/13/18 0200        Code Status History    Date Active Date Inactive Code Status Order ID Comments User Context   11/03/2016 1952 11/07/2016 1602 Full Code [id number]  Henreitta Leber, MD Inpatient   Advance Care Planning Activity      TOTAL TIME TAKING CARE OF THIS PATIENT: 35 minutes.    Vaughan Basta M.D on 08/15/2018 at 1:51 PM  Between 7am to 6pm - Pager - [phone]  After 6pm go to [email or web address] - password [email or web address] [facility]  Office  [phone]  CC: Primary care physician; Elby Beck, FNP   Note: This dictation was prepared with Dragon dictation along with smaller phrase technology. Any transcriptional errors that result from this process are unintentional.

## 2018-08-17 ENCOUNTER — Telehealth: Payer: Self-pay

## 2018-08-17 ENCOUNTER — Encounter: Payer: Self-pay | Admitting: Gastroenterology

## 2018-08-17 NOTE — Telephone Encounter (Signed)
Hospital follow up with me.

## 2018-08-17 NOTE — Telephone Encounter (Signed)
Please call patient and tell him that according to his hospital discharge note, he needs to follow up with Dr. Kathlee Nations (his cardiologist) regarding restarting his xarelto and benazepril.

## 2018-08-17 NOTE — Telephone Encounter (Signed)
Debbie, please see patient concern section. Thank you  Transition Care Management Follow-up Telephone Call   Date discharged? 08/15/2018   How have you been since you were released from the hospital? Basically feels ok. Gets tired sometimes.   Do you understand why you were in the hospital? yes   Do you understand the discharge instructions? yes   Where were you discharged to? Home with his wife.   Items Reviewed:  Medications reviewed:yes  Allergies reviewed: yes  Dietary changes reviewed: yes  Referrals reviewed:yes   Functional Questionnaire:   Activities of Daily Living (ADLs):   He states they are independent in the following: ambulation, feeding, dressing, hygiene, toileting, bathing. States they require assistance with the following:none   Any transportation issues/concerns?: No    Any patient concerns? -patient was told to stop Xarelto and speak with PCP or cardiologist before restarting. He was also taking Benazepril but it was not on D/C list and wonders is he not suppose to take this either?     Confirmed importance and date/time of follow-up visits scheduled yes  Provider Appointment booked with Clarene Reamer, NP on 08/21/2018 for in office visit.  Confirmed with patient if condition begins to worsen call PCP or go to the ER.  Patient was given the office number and encouraged to call back with question or concerns.  : yes

## 2018-08-18 NOTE — Telephone Encounter (Signed)
Patient advised to follow up with cardiologist about medications and keep his hospital follow up with Debbie on 08/21/2018

## 2018-08-21 ENCOUNTER — Encounter: Payer: Self-pay | Admitting: Family Medicine

## 2018-08-21 ENCOUNTER — Other Ambulatory Visit: Payer: Self-pay

## 2018-08-21 ENCOUNTER — Ambulatory Visit (INDEPENDENT_AMBULATORY_CARE_PROVIDER_SITE_OTHER): Payer: Medicare Other | Admitting: Family Medicine

## 2018-08-21 VITALS — BP 122/60 | HR 76 | Temp 98.5°F | Ht 71.0 in | Wt 166.4 lb

## 2018-08-21 DIAGNOSIS — Z09 Encounter for follow-up examination after completed treatment for conditions other than malignant neoplasm: Secondary | ICD-10-CM

## 2018-08-21 DIAGNOSIS — E118 Type 2 diabetes mellitus with unspecified complications: Secondary | ICD-10-CM | POA: Diagnosis not present

## 2018-08-21 DIAGNOSIS — K922 Gastrointestinal hemorrhage, unspecified: Secondary | ICD-10-CM

## 2018-08-21 NOTE — Patient Instructions (Addendum)
Please stop your metformin, we will recheck your hemoglobin A1c in 3 months.   Check your blood sugar a couple of times a week and keep your log, vary the time of day. Let me know if readings more than 150 more than 50% of the time.   If you want to schedule a nurse visit to check your blood sugar meter with ours in the office, please just schedule a nurse visit.

## 2018-08-21 NOTE — Progress Notes (Signed)
Subjective:    Patient ID: Gabriel Brewer, male    DOB: [birth year], 78 y.o.   MRN: [id number]  HPI This is a 78 yo male who presents today for hospital follow up. Was admitted 08/13/2018- 08/15/2018 for GI bleed. He presented with weakness and dark stools. He had cholecystectomy two weeks prior to admission. He received blood transfusion for anemia and GI was consulted. He underwent an enteroscopy and was found to have a single bleeding lesion that was treated. He was feeling better prior to discharge and was discharged home. Since getting home, he is feeling stronger, able to do his normal activities but a little slower with rest breaks. Continues to have dark but not black stools. TAking iron twice a day. Denies constipation.  He is currently holding his Xarelto and has follow up with cardiology upcoming to discuss restarting. He has been in sinus with rate controlled since being transfused. He denies chest pain, palpitations.   He checks his blood sugar at home, this am was 144. His last hgba1c was 5.1 on 08/13/2018.   Current Outpatient Medications  Medication Instructions  . atorvastatin (LIPITOR) 10 mg, Oral, Daily  . Cholecalciferol 4,000 Units, Oral, Every Fri  . diltiazem (CARDIZEM CD) 180 mg, Oral, Daily  . ferrous sulfate 325 mg, Oral, 2 times daily with meals  . fluticasone (FLONASE) 50 MCG/ACT nasal spray 1 spray, Each Nare, Daily PRN  . hydrochlorothiazide (HYDRODIURIL) 12.5 mg, Oral, Daily  . metFORMIN (GLUCOPHAGE) 500 MG tablet TAKE 1 TABLET(500 MG) BY MOUTH TWICE DAILY  . omeprazole (PRILOSEC) 40 mg, Oral, Daily  . traMADol (ULTRAM) 50 mg, Oral, Every 6 hours PRN   Past Medical History:  Diagnosis Date  . Cancer ([place]) 2017   skin   . COPD (chronic obstructive pulmonary disease) ([place])   . Diabetes mellitus without complication ([place])   . GERD (gastroesophageal reflux disease)   . Hyperlipidemia   . Hypertension   . Mitral regurgitation   . PAF (paroxysmal atrial  fibrillation) ([place])   . Stroke [facility])    Past Surgical History:  Procedure Laterality Date  . CHOLECYSTECTOMY N/A 07/30/2018   Procedure: LAPAROSCOPIC CHOLECYSTECTOMY WITH INTRAOPERATIVE CHOLANGIOGRAM;  Surgeon: Greer Pickerel, MD;  Location: [facility] ORS;  Service: General;  Laterality: N/A;  . COLONOSCOPY WITH PROPOFOL N/A 11/17/2017   Procedure: COLONOSCOPY WITH PROPOFOL;  Surgeon: Jonathon Bellows, MD;  Location: [facility] ENDOSCOPY;  Service: Gastroenterology;  Laterality: N/A;  . ENTEROSCOPY N/A 08/14/2018   Procedure: ENTEROSCOPY;  Surgeon: Virgel Manifold, MD;  Location: [facility] ENDOSCOPY;  Service: Endoscopy;  Laterality: N/A;  . ESOPHAGOGASTRODUODENOSCOPY (EGD) WITH PROPOFOL N/A 11/17/2017   Procedure: ESOPHAGOGASTRODUODENOSCOPY (EGD) WITH PROPOFOL;  Surgeon: Jonathon Bellows, MD;  Location: [facility] ENDOSCOPY;  Service: Gastroenterology;  Laterality: N/A;  . GIVENS CAPSULE STUDY N/A 12/09/2017   Procedure: GIVENS CAPSULE STUDY;  Surgeon: Jonathon Bellows, MD;  Location: [facility] ENDOSCOPY;  Service: Gastroenterology;  Laterality: N/A;  . TONSILLECTOMY     Family History  Problem Relation Age of Onset  . Cerebral aneurysm Mother   . Heart disease Father   . COPD Father   . Breast cancer Sister   . Cancer Brother        unknowsn   Social History   Tobacco Use  . Smoking status: Former Smoker    Packs/day: 0.50    Years: 20.00    Pack years: 10.00    Types: Cigarettes    Quit date: 11/20/1982    Years since quitting: 35.7  .  Smokeless tobacco: Never Used  Substance Use Topics  . Alcohol use: No  . Drug use: No      Review of Systems Per HPI    Objective:   Physical Exam Physical Exam  Constitutional: Oriented to person, place, and time. He appears well-developed and well-nourished.  HENT:  Head: Normocephalic and atraumatic.  Eyes: Conjunctivae are normal.  Neck: Normal range of motion. Neck supple.  Cardiovascular: Normal rate, regular rhythm and normal heart sounds.   Pulmonary/Chest:  Effort normal and breath sounds normal.  Musculoskeletal: Normal range of motion.  Neurological: Alert and oriented to person, place, and time.  Skin: Skin is warm and dry.  Psychiatric: Normal mood and affect. Behavior is normal. Judgment and thought content normal.  Vitals reviewed.  BP 122/60 (BP Location: Left Arm, Patient Position: Sitting, Cuff Size: Normal)   Pulse 76   Temp 98.5 F (36.9 C) (Temporal)   Ht 5\' 11"  (1.803 m)   Wt 166 lb 6.4 oz (75.5 kg)   SpO2 98%   BMI 23.21 kg/m  Wt Readings from Last 3 Encounters:  08/21/18 166 lb 6.4 oz (75.5 kg)  08/14/18 167 lb 8.8 oz (76 kg)  07/30/18 171 lb 4.8 oz (77.7 kg)       Assessment & Plan:  1. Hospital discharge follow-up - patient with understanding of discharge instructions, RTC/ER follow up precautions  2. Controlled type 2 diabetes mellitus with complication, without long-term current use of insulin (HCC) - will hold metformin and recheck hemoglobin A1c in 3 months  3. Gastrointestinal hemorrhage, unspecified gastrointestinal hemorrhage type - has follow up with oncology in 3 days with labs - continue to hold Xarelto until cardiology appointment   Clarene Reamer, FNP-BC  [facility]  08/23/2018 1:06 PM

## 2018-08-23 ENCOUNTER — Encounter: Payer: Self-pay | Admitting: Family Medicine

## 2018-08-23 DIAGNOSIS — E118 Type 2 diabetes mellitus with unspecified complications: Secondary | ICD-10-CM | POA: Insufficient documentation

## 2018-08-24 ENCOUNTER — Inpatient Hospital Stay: Payer: Medicare Other

## 2018-08-24 ENCOUNTER — Inpatient Hospital Stay (HOSPITAL_BASED_OUTPATIENT_CLINIC_OR_DEPARTMENT_OTHER): Payer: Medicare Other | Admitting: Oncology

## 2018-08-24 ENCOUNTER — Other Ambulatory Visit: Payer: Self-pay

## 2018-08-24 ENCOUNTER — Encounter: Payer: Self-pay | Admitting: Oncology

## 2018-08-24 ENCOUNTER — Inpatient Hospital Stay: Payer: Medicare Other | Attending: Oncology

## 2018-08-24 VITALS — BP 131/76 | HR 73 | Temp 97.0°F | Resp 18 | Wt 168.7 lb

## 2018-08-24 DIAGNOSIS — N289 Disorder of kidney and ureter, unspecified: Secondary | ICD-10-CM | POA: Insufficient documentation

## 2018-08-24 DIAGNOSIS — D500 Iron deficiency anemia secondary to blood loss (chronic): Secondary | ICD-10-CM

## 2018-08-24 DIAGNOSIS — Q2730 Arteriovenous malformation, site unspecified: Secondary | ICD-10-CM | POA: Insufficient documentation

## 2018-08-24 DIAGNOSIS — Z79899 Other long term (current) drug therapy: Secondary | ICD-10-CM | POA: Insufficient documentation

## 2018-08-24 DIAGNOSIS — Z7901 Long term (current) use of anticoagulants: Secondary | ICD-10-CM | POA: Diagnosis not present

## 2018-08-24 LAB — CBC
HCT: 37.9 % — ABNORMAL LOW (ref 39.0–52.0)
Hemoglobin: 12.4 g/dL — ABNORMAL LOW (ref 13.0–17.0)
MCH: 30 pg (ref 26.0–34.0)
MCHC: 32.7 g/dL (ref 30.0–36.0)
MCV: 91.8 fL (ref 80.0–100.0)
Platelets: 274 10*3/uL (ref 150–400)
RBC: 4.13 MIL/uL — ABNORMAL LOW (ref 4.22–5.81)
RDW: 16.7 % — ABNORMAL HIGH (ref 11.5–15.5)
WBC: 5.8 10*3/uL (ref 4.0–10.5)
nRBC: 0 % (ref 0.0–0.2)

## 2018-08-24 LAB — RETIC PANEL
Immature Retic Fract: 18.2 % — ABNORMAL HIGH (ref 2.3–15.9)
RBC.: 4.13 MIL/uL — ABNORMAL LOW (ref 4.22–5.81)
Retic Count, Absolute: 199 10*3/uL — ABNORMAL HIGH (ref 19.0–186.0)
Retic Ct Pct: 5.3 % — ABNORMAL HIGH (ref 0.4–3.1)
Reticulocyte Hemoglobin: 32.8 pg (ref >27.9)

## 2018-08-24 NOTE — Progress Notes (Signed)
Patient does not offer any problems today.  Here for hospital f/u and he did receive IV iron during admission.

## 2018-08-24 NOTE — Progress Notes (Signed)
Hematology/Oncology follow up  note [facility] [phone]) [phone] [phone]   Patient Care Team: Elby Beck, FNP as PCP - General (Nurse Practitioner)  REFERRING PROVIDER: Napoleon Form CHIEF COMPLAINTS/REASON FOR VISIT:  Evaluation of iron deficiency anemia  HISTORY OF PRESENTING ILLNESS:  Gabriel Brewer is a  78 y.o.  male with PMH listed below who was referred to me for evaluation of iron deficiency anemia.  [name] for evaluation of GI bleeding.  Patient is on chronic anticoagulation Xarelto for atrial fibrillation. He noticed having intermittent black stools for the past couple of weeks. 11/12/2017, CBC showed hemoglobin 7.2, MCV 65.6. Reviewed patient previous labs.  Since March 2016, he has started to have mild anemia with MCV of 79.8.  History of skin cancer in 2017.  History of nonrheumatic mitral regurgitation and history of encarditis mitral valve.    He is s/p 1 unit of PRBC transfusion last week. Feels fatigue is slightly better. Mild SOB with exertion.  11/17/2017 endoscopy and colonoscopy showed 3 cm hiatal hernia, mucosal changes in the esophagus.  Nonbleeding internal hemorrhoids, diverticulosis in the sigmoid colon.  150 mm polyp in the cecum, removed with mucosal resection.  Examination otherwise normal. There is plan for video capsule endoscopy in 2 weeks.  Patient was referred to hematology for discussion of future management plan for iron deficiency anemia.  #12/17/2017 underwent capsule study which showed a small proximal small bowel AVM, nonbleeding noted.  Was suggested to have push enteroscopy to ablate the AVM.    INTERVAL HISTORY Gabriel Brewer is a 78 y.o. male who has above history reviewed by me today presents for follow up visit for management of iron deficiency anemia Patient was recently admitted from 08/12/2020 08/15/2018, due to symptomatic anemia secondary to GI bleeding from small bowel  AVM.  He took Xarelto for atrial fibrillation.  Xarelto was held Status post blood transfusion and IV Venofer treatments in the hospital. CT abdomen showed status post cholecystectomy persistent fluid collection in the gallbladder fossa.  He was seen by Dr. Windell Moment who recommended no intervention, outpatient follow-up. EGD was done during admission by Dr. Bonna Gains.  1 angiectasia in the duodenum, and was treated. Today he reports energy level has significantly improved. He is taking oral iron supplementation.  Stool is dark but not black.  Denies any severe constipation.  Has bowel movement every other day. Denies any abdominal pain, shortness of breath, chest pain. Fatigue has significantly improved . He continues to be off anticoagulation.  Review of Systems  Constitutional: Negative for chills, fever, malaise/fatigue and weight loss.  HENT: Negative for nosebleeds and sore throat.   Eyes: Negative for double vision, photophobia and redness.  Respiratory: Negative for cough, shortness of breath and wheezing.   Cardiovascular: Negative for chest pain, palpitations, orthopnea and leg swelling.  Gastrointestinal: Negative for abdominal pain, blood in stool, melena, nausea and vomiting.  Genitourinary: Negative for dysuria.  Musculoskeletal: Negative for back pain, myalgias and neck pain.  Skin: Negative for itching and rash.  Neurological: Negative for dizziness, tingling and tremors.  Endo/Heme/Allergies: Negative for environmental allergies. Does not bruise/bleed easily.  Psychiatric/Behavioral: Negative for depression and hallucinations.    MEDICAL HISTORY:  Past Medical History:  Diagnosis Date   Cancer ([place]) 2017   skin    COPD (chronic obstructive pulmonary disease) ([place])    Diabetes mellitus without complication (HCC)    GERD (gastroesophageal reflux disease)    Hyperlipidemia    Hypertension  Mitral regurgitation    PAF (paroxysmal atrial fibrillation)  ([place])    Stroke [facility])     SURGICAL HISTORY: Past Surgical History:  Procedure Laterality Date   CHOLECYSTECTOMY N/A 07/30/2018   Procedure: LAPAROSCOPIC CHOLECYSTECTOMY WITH INTRAOPERATIVE CHOLANGIOGRAM;  Surgeon: Greer Pickerel, MD;  Location: [facility];  Service: General;  Laterality: N/A;   COLONOSCOPY WITH PROPOFOL N/A 11/17/2017   Procedure: COLONOSCOPY WITH PROPOFOL;  Surgeon: Jonathon Bellows, MD;  Location: [facility] ENDOSCOPY;  Service: Gastroenterology;  Laterality: N/A;   ENTEROSCOPY N/A 08/14/2018   Procedure: ENTEROSCOPY;  Surgeon: Virgel Manifold, MD;  Location: [facility] ENDOSCOPY;  Service: Endoscopy;  Laterality: N/A;   ESOPHAGOGASTRODUODENOSCOPY (EGD) WITH PROPOFOL N/A 11/17/2017   Procedure: ESOPHAGOGASTRODUODENOSCOPY (EGD) WITH PROPOFOL;  Surgeon: Jonathon Bellows, MD;  Location: [facility] ENDOSCOPY;  Service: Gastroenterology;  Laterality: N/A;   GIVENS CAPSULE STUDY N/A 12/09/2017   Procedure: GIVENS CAPSULE STUDY;  Surgeon: Jonathon Bellows, MD;  Location: [facility] ENDOSCOPY;  Service: Gastroenterology;  Laterality: N/A;   TONSILLECTOMY      SOCIAL HISTORY: Social History   Socioeconomic History   Marital status: Married    Spouse name: Not on file   Number of children: Not on file   Years of education: Not on file   Highest education level: Not on file  Occupational History   Occupation: retired  Scientist, product/process development strain: Not on file   Food insecurity    Worry: Not on file    Inability: Not on Lexicographer needs    Medical: Not on file    Non-medical: Not on file  Tobacco Use   Smoking status: Former Smoker    Packs/day: 0.50    Years: 20.00    Pack years: 10.00    Types: Cigarettes    Quit date: 11/20/1982    Years since quitting: 35.7   Smokeless tobacco: Never Used  Substance and Sexual Activity   Alcohol use: No   Drug use: No   Sexual activity: Not Currently    Partners: Female  Lifestyle   Physical activity    Days per week:  Not on file    Minutes per session: Not on file   Stress: Not on file  Relationships   Social connections    Talks on phone: Not on file    Gets together: Not on file    Attends religious service: Not on file    Active member of club or organization: Not on file    Attends meetings of clubs or organizations: Not on file    Relationship status: Not on file   Intimate partner violence    Fear of current or ex partner: Not on file    Emotionally abused: Not on file    Physically abused: Not on file    Forced sexual activity: Not on file  Other Topics Concern   Not on file  Social History Narrative   Not on file    FAMILY HISTORY: Family History  Problem Relation Age of Onset   Cerebral aneurysm Mother    Heart disease Father    COPD Father    Breast cancer Sister    Cancer Brother        unknowsn    ALLERGIES:  has No Known Allergies.  MEDICATIONS:  Current Outpatient Medications  Medication Sig Dispense Refill   atorvastatin (LIPITOR) 10 MG tablet Take 1 tablet (10 mg total) by mouth daily. 90 tablet 3   Cholecalciferol 4000 units CAPS Take 4,000  Units by mouth every Friday.      diltiazem (CARDIZEM CD) 180 MG 24 hr capsule Take 1 capsule (180 mg total) by mouth daily. 90 capsule 3   ferrous sulfate 325 (65 FE) MG tablet Take 1 tablet (325 mg total) by mouth 2 (two) times daily with a meal. 60 tablet 3   fluticasone (FLONASE) 50 MCG/ACT nasal spray Place 1 spray into both nostrils daily as needed for allergies.      hydrochlorothiazide (HYDRODIURIL) 12.5 MG tablet Take 12.5 mg by mouth daily.      omeprazole (PRILOSEC) 40 MG capsule Take 1 capsule (40 mg total) by mouth daily. 90 capsule 3   metFORMIN (GLUCOPHAGE) 500 MG tablet TAKE 1 TABLET(500 MG) BY MOUTH TWICE DAILY (Patient not taking: No sig reported) 180 tablet 0   traMADol (ULTRAM) 50 MG tablet Take 1 tablet (50 mg total) by mouth every 6 (six) hours as needed for severe pain. (Patient not  taking: Reported on 08/24/2018) 10 tablet 0   No current facility-administered medications for this visit.      PHYSICAL EXAMINATION: ECOG PERFORMANCE STATUS: 0 - Asymptomatic Vitals:   08/24/18 1346  BP: 131/76  Pulse: 73  Resp: 18  Temp: (!) 97 F (36.1 C)   Filed Weights   08/24/18 1346  Weight: 168 lb 11 oz (76.5 kg)    Physical Exam Constitutional:      General: He is not in acute distress. HENT:     Head: Normocephalic and atraumatic.  Eyes:     General: No scleral icterus.    Pupils: Pupils are equal, round, and reactive to light.  Neck:     Musculoskeletal: Normal range of motion and neck supple.  Cardiovascular:     Rate and Rhythm: Normal rate and regular rhythm.     Heart sounds: Normal heart sounds.  Pulmonary:     Effort: Pulmonary effort is normal. No respiratory distress.     Breath sounds: No wheezing.  Abdominal:     General: Bowel sounds are normal. There is no distension.     Palpations: Abdomen is soft. There is no mass.     Tenderness: There is no abdominal tenderness.  Musculoskeletal: Normal range of motion.        General: No deformity.  Skin:    General: Skin is warm and dry.     Findings: No erythema or rash.  Neurological:     Mental Status: He is alert and oriented to person, place, and time.     Cranial Nerves: No cranial nerve deficit.     Coordination: Coordination normal.  Psychiatric:        Behavior: Behavior normal.        Thought Content: Thought content normal.      LABORATORY DATA:  I have reviewed the data as listed Lab Results  Component Value Date   WBC 5.8 08/24/2018   HGB 12.4 (L) 08/24/2018   HCT 37.9 (L) 08/24/2018   MCV 91.8 08/24/2018   PLT 274 08/24/2018   Recent Labs    03/13/18 1957 03/16/18 1108 07/27/18 1557 08/12/18 2154 08/13/18 0402 08/13/18 1348  NA 136  --  140 135 138 138  K 3.9  --  4.4 3.6 4.1 3.9  CL 103  --  105 103 108 109  CO2 24  --  23 22 22  21*  GLUCOSE 143*  --  108* 159*  132* 123*  BUN 26*  --  29* 40* 36* 26*  CREATININE 1.07  --  1.17 1.27* 1.06 1.04  CALCIUM 8.9  --  9.9 8.8* 8.3* 8.5*  GFRNONAA >60  --  59* 54* >60 >60  GFRAA >60  --  >60 >60 >60 >60  PROT 6.8 7.4 7.5  --   --  5.9*  ALBUMIN 4.1 4.2 4.7  --   --  3.6  AST 20 16 23   --   --  17  ALT 17 14 18   --   --  16  ALKPHOS 52 68 62  --   --  53  BILITOT 0.7 1.1 0.6  --   --  1.1  BILIDIR 0.1 0.2  --   --   --   --   IBILI 0.6  --   --   --   --   --    Iron/TIBC/Ferritin/ %Sat    Component Value Date/Time   IRON 29 (L) 08/13/2018 0402   IRON 8 (LL) 11/13/2017 1415   TIBC 321 08/13/2018 0402   TIBC 395 11/13/2017 1415   FERRITIN 40 05/25/2018 0938   FERRITIN 5 (L) 11/13/2017 1415   IRONPCTSAT 9 (L) 08/13/2018 0402   IRONPCTSAT 2 (LL) 11/13/2017 1415     RADIOGRAPHIC STUDIES: I have personally reviewed the radiological images as listed and agreed with the findings in the report. Dg Chest 1 View  Result Date: 08/13/2018 CLINICAL DATA:  Weakness EXAM: CHEST  1 VIEW COMPARISON:  03/13/2018, 11/29/2017, CT 11/05/2016 FINDINGS: Hyperinflation with emphysematous disease. No acute consolidation or pleural effusion. Stable cardiomediastinal silhouette. No pneumothorax. Apically pleural thickening. Small stellate focus in the right suprahilar lung. IMPRESSION: No active disease. Small stellate opacity in the right suprahilar lung either representing summation shadow versus small nodule. Suggest short interval two-view radiographic follow-up Electronically Signed   By: Donavan Foil M.D.   On: 08/13/2018 01:18   Dg Cholangiogram Operative  Result Date: 07/30/2018 CLINICAL DATA:  Intraoperative cholangiogram during laparoscopic cholecystectomy. EXAM: INTRAOPERATIVE CHOLANGIOGRAM FLUOROSCOPY TIME:  10 seconds (2.34 mGy) COMPARISON:  Abdominal ultrasound-03/16/2018 FINDINGS: Intraoperative cholangiographic images of the right upper abdominal quadrant during laparoscopic cholecystectomy are provided for  review. Surgical clips overlie the expected location of the gallbladder fossa. Contrast injection demonstrates selective cannulation of the central aspect of the cystic duct. There is passage of contrast through the central aspect of the cystic duct with filling of a non dilated common bile duct. There is passage of contrast though the CBD and into the descending portion of the duodenum. There is minimal reflux of injected contrast into the common hepatic duct and central aspect of the non dilated intrahepatic biliary system. Transient mobile filling defect within the common hepatic duct is favored to represent an air bubble. There are no discrete filling defects within the opacified portions of the biliary system to suggest the presence of choledocholithiasis. IMPRESSION: No definite evidence of choledocholithiasis. Electronically Signed   By: Sandi Mariscal M.D.   On: 07/30/2018 15:50   Ct Abdomen Pelvis W Contrast  Addendum Date: 08/14/2018   ADDENDUM REPORT: 08/14/2018 09:59 ADDENDUM: 1.4 centimeter lesion in the mid pole of the RIGHT kidney is indeterminate. Recommend further evaluation with renal protocol MRI or CT. Electronically Signed   By: Nolon Nations M.D.   On: 08/14/2018 09:59   Result Date: 08/14/2018 CLINICAL DATA:  Per MD note: 78 y.o. y/o male with recent cholecystectomy 2 weeks ago, admitted with melena and anemia, with previous history of iron deficiency anemia and non bleeding AVMs seen  in small bowel capsule endoscopy in December 2019. Patient on Xarelto. EXAM: CT ABDOMEN AND PELVIS WITH CONTRAST TECHNIQUE: Multidetector CT imaging of the abdomen and pelvis was performed using the standard protocol following bolus administration of intravenous contrast. CONTRAST:  167mL OMNIPAQUE IOHEXOL 300 MG/ML  SOLN COMPARISON:  CT of the chest, abdomen, pelvis on 09/16/2015 FINDINGS: Lower chest: Minimal LEFT LOWER lobe atelectasis. There is coronary artery atherosclerosis. Heart size is normal.  Hepatobiliary: No focal liver abnormality is seen. Status post cholecystectomy. There is persistent fluid collection in the gallbladder fossa, measuring 2.1 x 5.5 centimeters. Pancreas: Unremarkable. No pancreatic ductal dilatation or surrounding inflammatory changes. Spleen: A 7 millimeter hypodense lesion is identified in the MEDIAL spleen. A 3 millimeter hyperdense lesion is identified in the LATERAL spleen. A 4 millimeter nodule is identified in the LOWER aspect of the spleen. Spleen is normal in size. Adrenals/Urinary Tract: Adrenal glands are normal. A 1.4 centimeter low-attenuation lesion within the midpole of the RIGHT kidney is 54 Hounsfield units, indeterminate for cyst. No hydronephrosis. Ureters are unremarkable. Prominent impression of the prostate upon the urinary bladder. Otherwise the bladder is unremarkable. Stomach/Bowel: Moderate hiatal hernia. Stomach is otherwise normal in appearance. Small bowel loops are unremarkable. There are innumerable colonic diverticula, particularly involving the sigmoid segment where there is hypertrophy of the wall. No evidence for acute diverticulitis. The appendix is well seen and has a normal appearance. Vascular/Lymphatic: There is atherosclerotic calcification of the abdominal aorta. No associated aneurysm. No retroperitoneal or mesenteric adenopathy. Reproductive: Enlarged prostate. Other: Edema identified in the region of the umbilicus, likely indicating recent laparoscopy. No ascites. Musculoskeletal: No suspicious lytic or blastic lesions. Degenerative changes in the lumbar spine. Significant disc height loss and uncovertebral spurring at L2-3. IMPRESSION: 1. Status post cholecystectomy. Persistent fluid collection in the gallbladder fossa, measuring 2.1 x 5.5 cm. Findings may be related to postoperative change. Early infection is not excluded. The absence of ascites makes a bile leak less likely. 2. Small splenic lesions, too small to characterize. 3.  Moderate hiatal hernia. 4. Coronary artery disease. 5. Enlarged prostate gland. 6. Significant diverticular disease without evidence for acute diverticulitis. 7. Degenerative changes in the lumbar spine. 8. Aortic Atherosclerosis (ICD10-I70.0). Electronically Signed: By: Nolon Nations M.D. On: 08/13/2018 19:32   ASSESSMENT & PLAN:  1. Iron deficiency anemia due to chronic blood loss   2. Chronic anticoagulation   3. AVM (arteriovenous malformation)   4. Lesion of right native kidney    Labs reviewed and discussed with patient. His hemoglobin was 8.1 at discharge on 08/15/2018. Today CBC showed hemoglobin of 12.4.  Reticulocyte count showed normal reticulocyte hemoglobin 30.2. Hold additional IV iron at this point. Patient was advised to continue oral iron supplementation 325 mg twice daily. Advised patient to take Colace daily PRN if iron supplementation causes any constipation.  Chronic anticoagulation for atrial fibrillation Xarelto has been held since recent admission. Patient needs to follow-up with cardiology for further evaluation and discussion for the need of resuming Xarelto.  Recommend patient to follow-up with gastroenterology for push enteroscopy for ablation of AVM.  Right renal 1.4 cm lesion indeterminate.  Will need MRI abdomen for further characterization.  Will obtain . All questions were answered. The patient knows to call the clinic with any problems questions or concerns.  Return of visit: 3 months     Earlie Server, MD, PhD Hematology Oncology [facility] at [facility] Pager- [phone] 08/24/2018

## 2018-09-01 ENCOUNTER — Other Ambulatory Visit: Payer: Self-pay | Admitting: *Deleted

## 2018-09-01 ENCOUNTER — Telehealth: Payer: Self-pay | Admitting: *Deleted

## 2018-09-01 DIAGNOSIS — Z1389 Encounter for screening for other disorder: Secondary | ICD-10-CM

## 2018-09-01 DIAGNOSIS — Z0189 Encounter for other specified special examinations: Secondary | ICD-10-CM

## 2018-09-01 NOTE — Telephone Encounter (Signed)
Santiago Glad from MRI called, patient had metal placed in left leg years ago per patient survey. MRI needs order for patient to have left femur xray for clearance prior to MRI this week. I have placed the order and it should be in your inbasket to be signed off if you are okay with that. Thanks.

## 2018-09-02 DIAGNOSIS — I340 Nonrheumatic mitral (valve) insufficiency: Secondary | ICD-10-CM | POA: Diagnosis not present

## 2018-09-02 DIAGNOSIS — I10 Essential (primary) hypertension: Secondary | ICD-10-CM | POA: Diagnosis not present

## 2018-09-02 DIAGNOSIS — I480 Paroxysmal atrial fibrillation: Secondary | ICD-10-CM | POA: Diagnosis not present

## 2018-09-03 ENCOUNTER — Ambulatory Visit
Admission: RE | Admit: 2018-09-03 | Discharge: 2018-09-03 | Disposition: A | Discharge status: Home / Self Care | Payer: Medicare Other | Source: Ambulatory Visit | Attending: Oncology | Admitting: Oncology

## 2018-09-03 ENCOUNTER — Other Ambulatory Visit: Payer: Self-pay

## 2018-09-03 DIAGNOSIS — N289 Disorder of kidney and ureter, unspecified: Secondary | ICD-10-CM | POA: Insufficient documentation

## 2018-09-03 DIAGNOSIS — Z1389 Encounter for screening for other disorder: Secondary | ICD-10-CM

## 2018-09-03 DIAGNOSIS — M795 Residual foreign body in soft tissue: Secondary | ICD-10-CM | POA: Diagnosis not present

## 2018-09-03 DIAGNOSIS — N281 Cyst of kidney, acquired: Secondary | ICD-10-CM | POA: Diagnosis not present

## 2018-09-03 MED ORDER — GADOBUTROL 1 MMOL/ML IV SOLN
7.0000 mL | Freq: Once | INTRAVENOUS | Status: AC | PRN
  Administered 2018-09-03: 7 mL via INTRAVENOUS

## 2018-09-09 DIAGNOSIS — D0462 Carcinoma in situ of skin of left upper limb, including shoulder: Secondary | ICD-10-CM | POA: Diagnosis not present

## 2018-09-09 DIAGNOSIS — L570 Actinic keratosis: Secondary | ICD-10-CM | POA: Diagnosis not present

## 2018-09-10 DIAGNOSIS — H34811 Central retinal vein occlusion, right eye, with macular edema: Secondary | ICD-10-CM | POA: Diagnosis not present

## 2018-09-10 DIAGNOSIS — H348322 Tributary (branch) retinal vein occlusion, left eye, stable: Secondary | ICD-10-CM | POA: Diagnosis not present

## 2018-09-14 ENCOUNTER — Ambulatory Visit (INDEPENDENT_AMBULATORY_CARE_PROVIDER_SITE_OTHER): Payer: Medicare Other | Admitting: Family Medicine

## 2018-09-14 ENCOUNTER — Encounter: Payer: Self-pay | Admitting: Family Medicine

## 2018-09-14 ENCOUNTER — Other Ambulatory Visit: Payer: Self-pay

## 2018-09-14 VITALS — BP 124/60 | HR 73 | Temp 98.5°F | Ht 71.0 in | Wt 170.0 lb

## 2018-09-14 DIAGNOSIS — B029 Zoster without complications: Secondary | ICD-10-CM

## 2018-09-14 MED ORDER — VALACYCLOVIR HCL 1 G PO TABS: 1000.0000 mg | ORAL_TABLET | Freq: Three times a day (TID) | ORAL | 0 refills | Status: AC

## 2018-09-14 NOTE — Patient Instructions (Signed)
Keep area clean and dry, cover if will be in contact with others Notify me if any signs/symptoms infection occur- fever/chills, increased redness, pus drainage   Shingles  Shingles, which is also known as herpes zoster, is an infection that causes a painful skin rash and fluid-filled blisters. It is caused by a virus. Shingles only develops in people who:  Have had chickenpox.  Have been given a medicine to protect against chickenpox (have been vaccinated). Shingles is rare in this group. What are the causes? Shingles is caused by varicella-zoster virus (VZV). This is the same virus that causes chickenpox. After a person is exposed to VZV, the virus stays in the body in an inactive (dormant) state. Shingles develops if the virus is reactivated. This can happen many years after the first (initial) exposure to VZV. It is not known what causes this virus to be reactivated. What increases the risk? People who have had chickenpox or received the chickenpox vaccine are at risk for shingles. Shingles infection is more common in people who:  Are older than age 21.  Have a weakened disease-fighting system (immune system), such as people with: ? HIV. ? AIDS. ? Cancer.  Are taking medicines that weaken the immune system, such as transplant medicines.  Are experiencing a lot of stress. What are the signs or symptoms? Early symptoms of this condition include itching, tingling, and pain in an area on your skin. Pain may be described as burning, stabbing, or throbbing. A few days or weeks after early symptoms start, a painful red rash appears. The rash is usually on one side of the body and has a band-like or belt-like pattern. The rash eventually turns into fluid-filled blisters that break open, change into scabs, and dry up in about 2-3 weeks. At any time during the infection, you may also develop:  A fever.  Chills.  A headache.  An upset stomach. How is this diagnosed? This condition is  diagnosed with a skin exam. Skin or fluid samples may be taken from the blisters before a diagnosis is made. These samples are examined under a microscope or sent to a lab for testing. How is this treated? The rash may last for several weeks. There is not a specific cure for this condition. Your health care provider will probably prescribe medicines to help you manage pain, recover more quickly, and avoid long-term problems. Medicines may include:  Antiviral drugs.  Anti-inflammatory drugs.  Pain medicines.  Anti-itching medicines (antihistamines). If the area involved is on your face, you may be referred to a specialist, such as an eye doctor (ophthalmologist) or an ear, nose, and throat (ENT) doctor (otolaryngologist) to help you avoid eye problems, chronic pain, or disability. Follow these instructions at home: Medicines  Take over-the-counter and prescription medicines only as told by your health care provider.  Apply an anti-itch cream or numbing cream to the affected area as told by your health care provider. Relieving itching and discomfort   Apply cold, wet cloths (cold compresses) to the area of the rash or blisters as told by your health care provider.  Cool baths can be soothing. Try adding baking soda or dry oatmeal to the water to reduce itching. Do not bathe in hot water. Blister and rash care  Keep your rash covered with a loose bandage (dressing). Wear loose-fitting clothing to help ease the pain of material rubbing against the rash.  Keep your rash and blisters clean by washing the area with mild soap and cool water  as told by your health care provider.  Check your rash every day for signs of infection. Check for: ? More redness, swelling, or pain. ? Fluid or blood. ? Warmth. ? Pus or a bad smell.  Do not scratch your rash or pick at your blisters. To help avoid scratching: ? Keep your fingernails clean and cut short. ? Wear gloves or mittens while you sleep, if  scratching is a problem. General instructions  Rest as told by your health care provider.  Keep all follow-up visits as told by your health care provider. This is important.  Wash your hands often with soap and water. If soap and water are not available, use hand sanitizer. Doing this lowers your chance of getting a bacterial skin infection.  Before your blisters change into scabs, your shingles infection can cause chickenpox in people who have never had it or have never been vaccinated against it. To prevent this from happening, avoid contact with other people, especially: ? Babies. ? Pregnant women. ? Children who have eczema. ? Elderly people who have transplants. ? People who have chronic illnesses, such as cancer or AIDS. Contact a health care provider if:  Your pain is not relieved with prescribed medicines.  Your pain does not get better after the rash heals.  You have signs of infection in the rash area, such as: ? More redness, swelling, or pain around the rash. ? Fluid or blood coming from the rash. ? The rash area feeling warm to the touch. ? Pus or a bad smell coming from the rash. Get help right away if:  The rash is on your face or nose.  You have facial pain, pain around your eye area, or loss of feeling on one side of your face.  You have difficulty seeing.  You have ear pain or have ringing in your ear.  You have a loss of taste.  Your condition gets worse. Summary  Shingles, which is also known as herpes zoster, is an infection that causes a painful skin rash and fluid-filled blisters.  This condition is diagnosed with a skin exam. Skin or fluid samples may be taken from the blisters and examined before the diagnosis is made.  Keep your rash covered with a loose bandage (dressing). Wear loose-fitting clothing to help ease the pain of material rubbing against the rash.  Before your blisters change into scabs, your shingles infection can cause chickenpox  in people who have never had it or have never been vaccinated against it. This information is not intended to replace advice given to you by your health care provider. Make sure you discuss any questions you have with your health care provider. Document Released: 12/31/2004 Document Revised: 04/24/2018 Document Reviewed: 09/04/2016 Elsevier Patient Education  2020 [employer].

## 2018-09-14 NOTE — Progress Notes (Signed)
Subjective:    Patient ID: Gabriel Brewer, male    DOB: 10/01/1940, 78 y.o.   MRN: [id number]  HPI This is a 78 yo male who presents today with blister rash on his right elbow. Noticed it approximately 68 hours ago when he put pressure on elbow and it was painful. No drainage, few new lesions appearing daily. No systemic symptoms. No sensations prior to rash appearing. Is having increased sensitivity of forearm. Had chicken pox as a child. Had old shingles vaccine, has not had Shingrix.   Past Medical History:  Diagnosis Date  . Cancer ([place]) 2017   skin   . COPD (chronic obstructive pulmonary disease) ([place])   . Diabetes mellitus without complication ([place])   . GERD (gastroesophageal reflux disease)   . Hyperlipidemia   . Hypertension   . Mitral regurgitation   . PAF (paroxysmal atrial fibrillation) ([place])   . Stroke [facility])    Past Surgical History:  Procedure Laterality Date  . CHOLECYSTECTOMY N/A 07/30/2018   Procedure: LAPAROSCOPIC CHOLECYSTECTOMY WITH INTRAOPERATIVE CHOLANGIOGRAM;  Surgeon: Greer Pickerel, MD;  Location: [facility] ORS;  Service: General;  Laterality: N/A;  . COLONOSCOPY WITH PROPOFOL N/A 11/17/2017   Procedure: COLONOSCOPY WITH PROPOFOL;  Surgeon: Jonathon Bellows, MD;  Location: [facility] ENDOSCOPY;  Service: Gastroenterology;  Laterality: N/A;  . ENTEROSCOPY N/A 08/14/2018   Procedure: ENTEROSCOPY;  Surgeon: Virgel Manifold, MD;  Location: [facility] ENDOSCOPY;  Service: Endoscopy;  Laterality: N/A;  . ESOPHAGOGASTRODUODENOSCOPY (EGD) WITH PROPOFOL N/A 11/17/2017   Procedure: ESOPHAGOGASTRODUODENOSCOPY (EGD) WITH PROPOFOL;  Surgeon: Jonathon Bellows, MD;  Location: [facility] ENDOSCOPY;  Service: Gastroenterology;  Laterality: N/A;  . GIVENS CAPSULE STUDY N/A 12/09/2017   Procedure: GIVENS CAPSULE STUDY;  Surgeon: Jonathon Bellows, MD;  Location: [facility] ENDOSCOPY;  Service: Gastroenterology;  Laterality: N/A;  . TONSILLECTOMY     Family History  Problem Relation Age of Onset  . Cerebral aneurysm Mother    . Heart disease Father   . COPD Father   . Breast cancer Sister   . Cancer Brother        [id number]   Social History   Tobacco Use  . Smoking status: Former Smoker    Packs/day: 0.50    Years: 20.00    Pack years: 10.00    Types: Cigarettes    Quit date: 11/20/1982    Years since quitting: 35.8  . Smokeless tobacco: Never Used  Substance Use Topics  . Alcohol use: No  . Drug use: No      Review of Systems Per HPI    Objective:   Physical Exam Vitals signs reviewed.  Constitutional:      General: He is not in acute distress.    Appearance: Normal appearance. He is normal weight. He is not ill-appearing, toxic-appearing or diaphoretic.  HENT:     Head: Normocephalic and atraumatic.  Eyes:     Conjunctiva/sclera: Conjunctivae normal.  Cardiovascular:     Rate and Rhythm: Normal rate.  Pulmonary:     Effort: Pulmonary effort is normal.  Skin:    General: Skin is warm and dry.     Comments: Clustered vesicles in various stages, some with scabbing, on right elbow. No streaking, no lesions above or below elbow.   Neurological:     Mental Status: He is alert and oriented to person, place, and time.  Psychiatric:        Mood and Affect: Mood normal.        Behavior: Behavior normal.  Thought Content: Thought content normal.        Judgment: Judgment normal.       BP 124/60 (BP Location: Left Arm, Patient Position: Sitting, Cuff Size: Normal)   Pulse 73   Temp 98.5 F (36.9 C) (Temporal)   Ht 5\' 11"  (1.803 m)   Wt 170 lb (77.1 kg)   SpO2 96%   BMI 23.71 kg/m  Wt Readings from Last 3 Encounters:  09/14/18 170 lb (77.1 kg)  08/24/18 168 lb 11 oz (76.5 kg)  08/21/18 166 lb 6.4 oz (75.5 kg)       Assessment & Plan:  1. Herpes zoster without complication - also examined by Dr. Silvio Pate - suspicious for herpes zoster, is within 72 hour treatment window - Provided written and verbal information regarding diagnosis and treatment. - RTC precautions  reviewed - valACYclovir (VALTREX) 1000 MG tablet; Take 1 tablet (1,000 mg total) by mouth 3 (three) times daily for 7 days.  Dispense: 21 tablet; Refill: 0   Clarene Reamer, FNP-BC  [facility]  09/14/2018 5:30 PM

## 2018-09-15 ENCOUNTER — Encounter: Payer: Self-pay | Admitting: Family Medicine

## 2018-09-24 ENCOUNTER — Other Ambulatory Visit: Payer: Self-pay

## 2018-09-24 ENCOUNTER — Ambulatory Visit (INDEPENDENT_AMBULATORY_CARE_PROVIDER_SITE_OTHER): Payer: Medicare Other | Admitting: Gastroenterology

## 2018-09-24 VITALS — HR 80 | Temp 97.6°F | Ht 71.0 in | Wt 170.0 lb

## 2018-09-24 DIAGNOSIS — D509 Iron deficiency anemia, unspecified: Secondary | ICD-10-CM

## 2018-09-24 DIAGNOSIS — Z86010 Personal history of colonic polyps: Secondary | ICD-10-CM

## 2018-09-24 NOTE — Progress Notes (Signed)
Jonathon Bellows MD, MRCP(U.K) [address]  [place]  [place], [place] [address]  Main: [phone]  Fax: [phone]   Primary Care Physician: Elby Beck, FNP  Primary Gastroenterologist:  Dr. Jonathon Bellows   Iron deficiency anemia follow-up  HPI: Gabriel Brewer is a 78 y.o. male  Summary of history : He was last seen in my office on 12/03/2017.  Initially referred in 11/13/17 for anemia, he had been on xarelto and had been having melena for a few weeks, found to have iron deficiency anemia with a hemoglobin of 7.2 g and a ferritin of 6.  Celiac serology was negative.  Received IV iron with hematology  11/17/17 : EGD : 3 cm hiatal hernia. Colonoscopy 15 mm cecal polyp resected. (tubulovillous adenoma)   Interval history  12/03/2017-09/24/2018  12/17/2017: Capsule study of the small bowel found small proximal small bowel AVM.  He was suggested to have this proximal small bowel treated but did not follow-up.  He presented to the hospital with anemia in 08/03/2018.  Underwent a push enteroscopy and was found to have a bleeding duodenal AVM that was cauterized.  He also been on NSAIDs in addition to Xarelto just prior to his admission.   He is here today for hospital follow-up.  08/24/2018: Hemoglobin 12.4 g with an MCV of 91.8.  He states his stool is dark because he takes iron pills.  He is taking omeprazole 40 mg a day.  His Xarelto was changed to Eliquis.  He says that he has times when he has to rush to the restroom and has a large softer than usual bowel movement.  Current Outpatient Medications  Medication Sig Dispense Refill  . apixaban (ELIQUIS) 5 MG TABS tablet Take 1 tablet by mouth 2 (two) times daily.    Marland Kitchen atorvastatin (LIPITOR) 10 MG tablet Take 1 tablet (10 mg total) by mouth daily. 90 tablet 3  . Cholecalciferol 4000 units CAPS Take 4,000 Units by mouth every Friday.     . diltiazem (CARDIZEM CD) 180 MG 24 hr capsule Take 1 capsule (180 mg total) by mouth daily.  90 capsule 3  . ferrous sulfate 325 (65 FE) MG tablet Take 1 tablet (325 mg total) by mouth 2 (two) times daily with a meal. 60 tablet 3  . fluticasone (FLONASE) 50 MCG/ACT nasal spray Place 1 spray into both nostrils daily as needed for allergies.     . hydrochlorothiazide (HYDRODIURIL) 12.5 MG tablet Take 12.5 mg by mouth daily.     . metFORMIN (GLUCOPHAGE) 500 MG tablet TAKE 1 TABLET(500 MG) BY MOUTH TWICE DAILY (Patient not taking: Reported on 09/14/2018) 180 tablet 0  . omeprazole (PRILOSEC) 40 MG capsule Take 1 capsule (40 mg total) by mouth daily. 90 capsule 3   No current facility-administered medications for this visit.     Allergies as of 09/24/2018  . (No Known Allergies)    ROS:  General: Negative for anorexia, weight loss, fever, chills, fatigue, weakness. ENT: Negative for hoarseness, difficulty swallowing , nasal congestion. CV: Negative for chest pain, angina, palpitations, dyspnea on exertion, peripheral edema.  Respiratory: Negative for dyspnea at rest, dyspnea on exertion, cough, sputum, wheezing.  GI: See history of present illness. GU:  Negative for dysuria, hematuria, urinary incontinence, urinary frequency, nocturnal urination.  Endo: Negative for unusual weight change.    Physical Examination:   There were no vitals taken for this visit.  General: Well-nourished, well-developed in no acute distress.  Eyes: No icterus. Conjunctivae  pink. Mouth: Oropharyngeal mucosa moist and pink , no lesions erythema or exudate. Lungs: Clear to auscultation bilaterally. Non-labored. Heart: Regular rate and rhythm, no murmurs rubs or gallops.  Abdomen: Bowel sounds are normal, nontender, nondistended, no hepatosplenomegaly or masses, no abdominal bruits or hernia , no rebound or guarding.   Extremities: No lower extremity edema. No clubbing or deformities. Neuro: Alert and oriented x 3.  Grossly intact. Skin: Warm and dry, no jaundice.   Psych: Alert and cooperative, normal  mood and affect.   Imaging Studies: Mr Abdomen W Wo Contrast  Result Date: 09/03/2018 CLINICAL DATA:  Indeterminate right renal lesion on recent CT. One month status post cholecystectomy. EXAM: MRI ABDOMEN WITHOUT AND WITH CONTRAST TECHNIQUE: Multiplanar multisequence MR imaging of the abdomen was performed both before and after the administration of intravenous contrast. CONTRAST:  7 mL Gadavist COMPARISON:  CT on 08/13/2018 FINDINGS: Lower chest: No acute findings. Hepatobiliary: No hepatic masses identified. Prior cholecystectomy. No evidence of biliary obstruction. There has been near complete resolution of fluid collection in the gallbladder fossa since prior study. Pancreas:  No mass or inflammatory changes. Spleen:  Within normal limits in size and appearance. Adrenals/Urinary Tract: Normal appearance adrenal glands. A 13 mm benign Bosniak category 2 cyst is seen in the midpole of the right kidney, which corresponds with lesion seen on previous CT. A tiny sub-cm Bosniak category 2 cyst is also seen in the posterior midpole the left kidney. A few other tiny sub-cm cysts are noted in both kidneys. No evidence of solid renal mass or hydronephrosis. Stomach/Bowel: Small hiatal hernia.  Otherwise unremarkable. Vascular/Lymphatic: No pathologically enlarged lymph nodes identified. No abdominal aortic aneurysm. Other:  None. Musculoskeletal:  No suspicious bone lesions identified. IMPRESSION: 13 mm benign Bosniak category 2 cyst in midpole of right kidney, corresponding with lesion seen on previous CT. No evidence of renal neoplasm or hydronephrosis. Small hiatal hernia. Electronically Signed   By: Marlaine Hind M.D.   On: 09/03/2018 14:01   Dg Femur Min 2 Views Left  Result Date: 09/03/2018 CLINICAL DATA:  Left distal femur, metal clearance for MRI EXAM: LEFT FEMUR 2 VIEWS COMPARISON:  None. FINDINGS: Medial to the distal femoral shaft there is a 0.4 by 0.2 cm metal foreign body in the soft tissues. Mild  atherosclerotic calcification noted in the lower leg. IMPRESSION: 1. 0.4 by 0.2 cm metal foreign body in the soft tissues medial to the distal femoral shaft. As long as this is a chronic, long-term finding, it most likely is of minimal risk in the MRI environment. Electronically Signed   By: Van Clines M.D.   On: 09/03/2018 10:23    Assessment and Plan:   Gabriel Brewer is a 78 y.o. y/o male here to follow up for iron deficiency anemia.  Has been on Xarelto. On IV iron , large cecal polyp resected in 12/03/2017.  Capsule study of the small bowel showed a proximal small bowel AVM that was requiring ablation but he did not follow-up.  Admitted in 08/03/2018 with anemia while on Xarelto and taking NSAIDs.  Found to have a proximal small bowel AVM that was bleeding and was treated with APC.  Subsequently hemoglobin has been stable and he is here today for follow-up.  Plan 1. colonoscopy to evaluate site of large cecal polyp seen in 12/03/2017 which was taken out piecemeal. 2.  Continue PPI as long as he is on Xarelto.  Iron deficiency is being managed by Dr. Tasia Catchings and hematology. 3.  He has a bit of urgency which could be either due to constipation related to the iron tablets or at times could be secondary to metformin use.  I suggested that we start with commencing him on some fiber pills to bulk up the stools and see how he does.  I have discussed alternative options, risks & benefits,  which include, but are not limited to, bleeding, infection, perforation,respiratory complication & drug reaction.  The patient agrees with this plan & written consent will be obtained.    Dr Jonathon Bellows  MD,MRCP [facility]) Follow up as needed

## 2018-09-28 ENCOUNTER — Other Ambulatory Visit: Payer: Medicare Other

## 2018-10-01 ENCOUNTER — Ambulatory Visit: Payer: Medicare Other

## 2018-10-01 ENCOUNTER — Other Ambulatory Visit: Payer: Self-pay

## 2018-10-01 ENCOUNTER — Telehealth: Payer: Self-pay

## 2018-10-01 ENCOUNTER — Ambulatory Visit: Payer: Medicare Other | Admitting: Oncology

## 2018-10-01 DIAGNOSIS — D509 Iron deficiency anemia, unspecified: Secondary | ICD-10-CM

## 2018-10-01 DIAGNOSIS — Z86010 Personal history of colonic polyps: Secondary | ICD-10-CM

## 2018-10-01 MED ORDER — NA SULFATE-K SULFATE-MG SULF 17.5-3.13-1.6 GM/177ML PO SOLN: 1.0000 | Freq: Once | ORAL | 0 refills | Status: AC

## 2018-10-01 NOTE — Telephone Encounter (Signed)
Spoke with pt and informed him that his cardiologist has cleared him for the colonoscopy. Pt cardiologist also gave instructions for pt to hold the Eliquis (blood thinner) 2 days prior to the procedure. Pt understands. Procedure has been scheduled. Pt is aware the procedure prep instruction letter has been sent to his MyChart and that the bowel prep prescription has been sent to his preferred pharmacy.

## 2018-10-05 ENCOUNTER — Other Ambulatory Visit
Admission: RE | Admit: 2018-10-05 | Discharge: 2018-10-05 | Disposition: A | Discharge status: Home / Self Care | Payer: Medicare Other | Source: Ambulatory Visit | Attending: Gastroenterology | Admitting: Gastroenterology

## 2018-10-05 ENCOUNTER — Other Ambulatory Visit: Payer: Self-pay

## 2018-10-05 DIAGNOSIS — Z20828 Contact with and (suspected) exposure to other viral communicable diseases: Secondary | ICD-10-CM | POA: Insufficient documentation

## 2018-10-05 DIAGNOSIS — H348112 Central retinal vein occlusion, right eye, stable: Secondary | ICD-10-CM | POA: Diagnosis not present

## 2018-10-05 DIAGNOSIS — E113293 Type 2 diabetes mellitus with mild nonproliferative diabetic retinopathy without macular edema, bilateral: Secondary | ICD-10-CM | POA: Diagnosis not present

## 2018-10-05 DIAGNOSIS — H524 Presbyopia: Secondary | ICD-10-CM | POA: Diagnosis not present

## 2018-10-05 DIAGNOSIS — H52223 Regular astigmatism, bilateral: Secondary | ICD-10-CM | POA: Diagnosis not present

## 2018-10-05 DIAGNOSIS — H35372 Puckering of macula, left eye: Secondary | ICD-10-CM | POA: Diagnosis not present

## 2018-10-05 DIAGNOSIS — H5203 Hypermetropia, bilateral: Secondary | ICD-10-CM | POA: Diagnosis not present

## 2018-10-05 DIAGNOSIS — Z01812 Encounter for preprocedural laboratory examination: Secondary | ICD-10-CM | POA: Insufficient documentation

## 2018-10-05 DIAGNOSIS — H348322 Tributary (branch) retinal vein occlusion, left eye, stable: Secondary | ICD-10-CM | POA: Diagnosis not present

## 2018-10-05 DIAGNOSIS — H2513 Age-related nuclear cataract, bilateral: Secondary | ICD-10-CM | POA: Diagnosis not present

## 2018-10-05 LAB — HM DIABETES EYE EXAM

## 2018-10-05 LAB — SARS CORONAVIRUS 2 (TAT 6-24 HRS): SARS Coronavirus 2: NEGATIVE

## 2018-10-08 ENCOUNTER — Ambulatory Visit
Admission: RE | Admit: 2018-10-08 | Discharge: 2018-10-08 | Disposition: A | Discharge status: Home / Self Care | Payer: Medicare Other | Attending: Gastroenterology | Admitting: Gastroenterology

## 2018-10-08 ENCOUNTER — Encounter
Admission: RE | Disposition: A | Discharge status: Home / Self Care | Payer: Self-pay | Source: Home / Self Care | Attending: Gastroenterology

## 2018-10-08 ENCOUNTER — Ambulatory Visit: Payer: Medicare Other | Admitting: Family

## 2018-10-08 ENCOUNTER — Encounter: Payer: Self-pay | Admitting: *Deleted

## 2018-10-08 DIAGNOSIS — K640 First degree hemorrhoids: Secondary | ICD-10-CM | POA: Insufficient documentation

## 2018-10-08 DIAGNOSIS — Z09 Encounter for follow-up examination after completed treatment for conditions other than malignant neoplasm: Secondary | ICD-10-CM | POA: Diagnosis not present

## 2018-10-08 DIAGNOSIS — E785 Hyperlipidemia, unspecified: Secondary | ICD-10-CM | POA: Diagnosis not present

## 2018-10-08 DIAGNOSIS — Z7901 Long term (current) use of anticoagulants: Secondary | ICD-10-CM | POA: Diagnosis not present

## 2018-10-08 DIAGNOSIS — Z8673 Personal history of transient ischemic attack (TIA), and cerebral infarction without residual deficits: Secondary | ICD-10-CM | POA: Insufficient documentation

## 2018-10-08 DIAGNOSIS — I5032 Chronic diastolic (congestive) heart failure: Secondary | ICD-10-CM | POA: Insufficient documentation

## 2018-10-08 DIAGNOSIS — J449 Chronic obstructive pulmonary disease, unspecified: Secondary | ICD-10-CM | POA: Insufficient documentation

## 2018-10-08 DIAGNOSIS — I480 Paroxysmal atrial fibrillation: Secondary | ICD-10-CM | POA: Insufficient documentation

## 2018-10-08 DIAGNOSIS — K219 Gastro-esophageal reflux disease without esophagitis: Secondary | ICD-10-CM | POA: Diagnosis not present

## 2018-10-08 DIAGNOSIS — E119 Type 2 diabetes mellitus without complications: Secondary | ICD-10-CM | POA: Diagnosis not present

## 2018-10-08 DIAGNOSIS — I340 Nonrheumatic mitral (valve) insufficiency: Secondary | ICD-10-CM | POA: Diagnosis not present

## 2018-10-08 DIAGNOSIS — D759 Disease of blood and blood-forming organs, unspecified: Secondary | ICD-10-CM | POA: Diagnosis not present

## 2018-10-08 DIAGNOSIS — D122 Benign neoplasm of ascending colon: Secondary | ICD-10-CM | POA: Insufficient documentation

## 2018-10-08 DIAGNOSIS — K5730 Diverticulosis of large intestine without perforation or abscess without bleeding: Secondary | ICD-10-CM | POA: Insufficient documentation

## 2018-10-08 DIAGNOSIS — I110 Hypertensive heart disease with heart failure: Secondary | ICD-10-CM | POA: Diagnosis not present

## 2018-10-08 DIAGNOSIS — Z79899 Other long term (current) drug therapy: Secondary | ICD-10-CM | POA: Diagnosis not present

## 2018-10-08 DIAGNOSIS — D509 Iron deficiency anemia, unspecified: Secondary | ICD-10-CM

## 2018-10-08 DIAGNOSIS — I509 Heart failure, unspecified: Secondary | ICD-10-CM | POA: Diagnosis not present

## 2018-10-08 DIAGNOSIS — D649 Anemia, unspecified: Secondary | ICD-10-CM | POA: Insufficient documentation

## 2018-10-08 DIAGNOSIS — Z860100 Personal history of colon polyps, unspecified: Secondary | ICD-10-CM

## 2018-10-08 DIAGNOSIS — Z87891 Personal history of nicotine dependence: Secondary | ICD-10-CM | POA: Insufficient documentation

## 2018-10-08 DIAGNOSIS — Z1211 Encounter for screening for malignant neoplasm of colon: Secondary | ICD-10-CM | POA: Diagnosis not present

## 2018-10-08 DIAGNOSIS — K5790 Diverticulosis of intestine, part unspecified, without perforation or abscess without bleeding: Secondary | ICD-10-CM | POA: Diagnosis not present

## 2018-10-08 DIAGNOSIS — K635 Polyp of colon: Secondary | ICD-10-CM | POA: Diagnosis not present

## 2018-10-08 HISTORY — PX: COLONOSCOPY WITH PROPOFOL: SHX5780

## 2018-10-08 SURGERY — COLONOSCOPY WITH PROPOFOL
Anesthesia: General

## 2018-10-08 MED ORDER — PROPOFOL 10 MG/ML IV BOLUS
INTRAVENOUS | Status: DC | PRN
  Administered 2018-10-08 (×2): 50 mg via INTRAVENOUS

## 2018-10-08 MED ORDER — SODIUM CHLORIDE 0.9 % IV SOLN
INTRAVENOUS | Status: DC
  Administered 2018-10-08: 1000 mL via INTRAVENOUS

## 2018-10-08 MED ORDER — PROPOFOL 500 MG/50ML IV EMUL
INTRAVENOUS | Status: DC | PRN
  Administered 2018-10-08: 140 ug/kg/min via INTRAVENOUS

## 2018-10-08 NOTE — H&P (Signed)
Gabriel Bellows, MD [address], [place], [place], [place], [address] [address], [place], [place], [place], [address] Phone: [phone]  Fax: [phone]  Primary Care Physician:  Elby Beck, FNP   Pre-Procedure History & Physical: HPI:  Gabriel Brewer is a 78 y.o. male is here for an colonoscopy.   Past Medical History:  Diagnosis Date  . Cancer ([place]) 2017   skin   . COPD (chronic obstructive pulmonary disease) ([place])   . Diabetes mellitus without complication ([place])   . GERD (gastroesophageal reflux disease)   . Hyperlipidemia   . Hypertension   . Mitral regurgitation   . PAF (paroxysmal atrial fibrillation) ([place])   . Stroke [facility])     Past Surgical History:  Procedure Laterality Date  . CHOLECYSTECTOMY N/A 07/30/2018   Procedure: LAPAROSCOPIC CHOLECYSTECTOMY WITH INTRAOPERATIVE CHOLANGIOGRAM;  Surgeon: Greer Pickerel, MD;  Location: [facility] ORS;  Service: General;  Laterality: N/A;  . COLONOSCOPY WITH PROPOFOL N/A 11/17/2017   Procedure: COLONOSCOPY WITH PROPOFOL;  Surgeon: Gabriel Bellows, MD;  Location: [facility] ENDOSCOPY;  Service: Gastroenterology;  Laterality: N/A;  . ENTEROSCOPY N/A 08/14/2018   Procedure: ENTEROSCOPY;  Surgeon: Virgel Manifold, MD;  Location: [facility] ENDOSCOPY;  Service: Endoscopy;  Laterality: N/A;  . ESOPHAGOGASTRODUODENOSCOPY (EGD) WITH PROPOFOL N/A 11/17/2017   Procedure: ESOPHAGOGASTRODUODENOSCOPY (EGD) WITH PROPOFOL;  Surgeon: Gabriel Bellows, MD;  Location: [facility] ENDOSCOPY;  Service: Gastroenterology;  Laterality: N/A;  . GIVENS CAPSULE STUDY N/A 12/09/2017   Procedure: GIVENS CAPSULE STUDY;  Surgeon: Gabriel Bellows, MD;  Location: [facility] ENDOSCOPY;  Service: Gastroenterology;  Laterality: N/A;  . TONSILLECTOMY      Prior to Admission medications   Medication Sig Start Date End Date Taking? Authorizing Provider  Cholecalciferol 4000 units CAPS Take 4,000 Units by mouth every Friday.    Yes [provider]  diltiazem (CARDIZEM CD) 180 MG 24 hr  capsule Take 1 capsule (180 mg total) by mouth daily. 05/04/18  Yes Elby Beck, FNP  ferrous sulfate 325 (65 FE) MG tablet Take 1 tablet (325 mg total) by mouth 2 (two) times daily with a meal. 08/15/18  Yes Vaughan Basta, MD  hydrochlorothiazide (HYDRODIURIL) 12.5 MG tablet Take 12.5 mg by mouth daily.  04/21/18  Yes [provider]  omeprazole (PRILOSEC) 40 MG capsule Take 1 capsule (40 mg total) by mouth daily. 05/04/18  Yes Elby Beck, FNP  apixaban (ELIQUIS) 5 MG TABS tablet Take 1 tablet by mouth 2 (two) times daily. 09/02/18 10/02/18  [provider]  atorvastatin (LIPITOR) 10 MG tablet Take 1 tablet (10 mg total) by mouth daily. Patient not taking: Reported on 10/08/2018 03/04/18   Elby Beck, FNP  fluticasone [facility]) 50 MCG/ACT nasal spray Place 1 spray into both nostrils daily as needed for allergies.  08/15/17   [provider]  metFORMIN (GLUCOPHAGE) 500 MG tablet TAKE 1 TABLET(500 MG) BY MOUTH TWICE DAILY Patient not taking: Reported on 09/14/2018 05/16/18   Elby Beck, FNP    Allergies as of 10/02/2018  . (No Known Allergies)    Family History  Problem Relation Age of Onset  . Cerebral aneurysm Mother   . Heart disease Father   . COPD Father   . Breast cancer Sister   . Cancer Brother        unknowsn    Social History   Socioeconomic History  . Marital status: Married    Spouse name: Not on file  . Number of children: Not on file  .  Years of education: Not on file  . Highest education level: Not on file  Occupational History  . Occupation: retired  Scientific laboratory technician  . Financial resource strain: Not on file  . Food insecurity    Worry: Not on file    Inability: Not on file  . Transportation needs    Medical: Not on file    Non-medical: Not on file  Tobacco Use  . Smoking status: Former Smoker    Packs/day: 0.50    Years: 20.00    Pack years: 10.00    Types: Cigarettes    Quit date: 11/20/1982    Years  since quitting: 35.9  . Smokeless tobacco: Never Used  Substance and Sexual Activity  . Alcohol use: No  . Drug use: No  . Sexual activity: Not Currently    Partners: Female  Lifestyle  . Physical activity    Days per week: Not on file    Minutes per session: Not on file  . Stress: Not on file  Relationships  . Social Herbalist on phone: Not on file    Gets together: Not on file    Attends religious service: Not on file    Active member of club or organization: Not on file    Attends meetings of clubs or organizations: Not on file    Relationship status: Not on file  . Intimate partner violence    Fear of current or ex partner: Not on file    Emotionally abused: Not on file    Physically abused: Not on file    Forced sexual activity: Not on file  Other Topics Concern  . Not on file  Social History Narrative  . Not on file    Review of Systems: See HPI, otherwise negative ROS  Physical Exam: BP 138/90   Pulse 94   Temp 97.6 F (36.4 C) (Tympanic)   Resp 18   Ht 5\' 11"  (1.803 m)   Wt 77.6 kg   SpO2 98%   BMI 23.85 kg/m  General:   Alert,  pleasant and cooperative in NAD Head:  Normocephalic and atraumatic. Neck:  Supple; no masses or thyromegaly. Lungs:  Clear throughout to auscultation, normal respiratory effort.    Heart:  +S1, +S2, Regular rate and rhythm, No edema. Abdomen:  Soft, nontender and nondistended. Normal bowel sounds, without guarding, and without rebound.   Neurologic:  Alert and  oriented x4;  grossly normal neurologically.  Impression/Plan: [name] is here for an colonoscopy to be performed for surveillance due to prior history of colon polyps , last 11/2017  Risks, benefits, limitations, and alternatives regarding  colonoscopy have been reviewed with the patient.  Questions have been answered.  All parties agreeable.   Gabriel Bellows, MD  10/08/2018, 9:50 AM

## 2018-10-08 NOTE — Op Note (Signed)
[facility] Gastroenterology Patient Name: Gabriel Brewer Procedure Date: 10/08/2018 9:52 AM MRN: [id number] Account #: [id number] Date of Birth: 1940-03-28 Admit Type: Outpatient Age: 78 Room: [facility] ENDO ROOM 4 Gender: Male Note Status: Finalized Procedure:            Colonoscopy Indications:          High risk colon cancer surveillance: Personal history                        of adenoma (10 mm or greater in size) Providers:            Jonathon Bellows MD, MD Referring MD:         Elby Beck (Referring MD) Medicines:            Monitored Anesthesia Care Complications:        No immediate complications. Procedure:            Pre-Anesthesia Assessment:                       - Prior to the procedure, a History and Physical was                        performed, and patient medications, allergies and                        sensitivities were reviewed. The patient's tolerance of                        previous anesthesia was reviewed.                       - The risks and benefits of the procedure and the                        sedation options and risks were discussed with the                        patient. All questions were answered and informed                        consent was obtained.                       - ASA Grade Assessment: III - A patient with severe                        systemic disease.                       After obtaining informed consent, the colonoscope was                        passed under direct vision. Throughout the procedure,                        the patient's blood pressure, pulse, and oxygen                        saturations were monitored continuously. The  Colonoscope was introduced through the anus and                        advanced to the the cecum, identified by the                        appendiceal orifice. The colonoscopy was performed with                        ease. The patient tolerated the procedure  well. The                        quality of the bowel preparation was excellent. Findings:      The perianal and digital rectal examinations were normal.      Multiple small-mouthed diverticula were found in the sigmoid colon.      Non-bleeding internal hemorrhoids were found during retroflexion. The       hemorrhoids were medium-sized and Grade I (internal hemorrhoids that do       not prolapse).      Two sessile polyps were found in the ascending colon. The polyps were 5       to 8 mm in size. These polyps were removed with a cold snare. Resection       and retrieval were complete.      A 3 mm polyp was found in the cecum. The polyp was hyperplastic. located       at prioor polypectomy site This was biopsied with a cold forceps for       histology.      The exam was otherwise without abnormality on direct and retroflexion       views. Impression:           - Diverticulosis in the sigmoid colon.                       - Non-bleeding internal hemorrhoids.                       - Two 5 to 8 mm polyps in the ascending colon, removed                        with a cold snare. Resected and retrieved.                       - One 3 mm polyp in the cecum. Biopsied.                       - The examination was otherwise normal on direct and                        retroflexion views. Recommendation:       - Discharge patient to home (with escort).                       - Resume previous diet.                       - Continue present medications.                       - Await pathology results.                       -  Repeat colonoscopy in 1 year for surveillance. Procedure Code(s):    --- Professional ---                       [phone], Colonoscopy, flexible; with removal of tumor(s),                        polyp(s), or other lesion(s) by snare technique                       45380, 40, Colonoscopy, flexible; with biopsy, single                        or multiple Diagnosis Code(s):    ---  Professional ---                       K63.5, Polyp of colon                       Z86.010, Personal history of colonic polyps                       K64.0, First degree hemorrhoids                       K57.30, Diverticulosis of large intestine without                        perforation or abscess without bleeding CPT copyright 2019 American Medical Association. All rights reserved. The codes documented in this report are preliminary and upon coder review may  be revised to meet current compliance requirements. Jonathon Bellows, MD Jonathon Bellows MD, MD 10/08/2018 10:19:17 AM This report has been signed electronically. Number of Addenda: 0 Note Initiated On: 10/08/2018 9:52 AM Scope Withdrawal Time: 0 hours 15 minutes 36 seconds  Total Procedure Duration: 0 hours 18 minutes 28 seconds  Estimated Blood Loss: Estimated blood loss: none.      [facility]

## 2018-10-08 NOTE — Transfer of Care (Signed)
Immediate Anesthesia Transfer of Care Note  Patient: Daymein Gelfand  Procedure(s) Performed: COLONOSCOPY WITH PROPOFOL (N/A )  Patient Location: PACU  Anesthesia Type:General  Level of Consciousness: drowsy  Airway & Oxygen Therapy: Patient Spontanous Breathing and Patient connected to nasal cannula oxygen  Post-op Assessment: Report given to RN and Post -op Vital signs reviewed and stable  Post vital signs: Reviewed and stable  Last Vitals:  Vitals Value Taken Time  BP 121/69 10/08/18 1019  Temp 36.2 C 10/08/18 1019  Pulse 83 10/08/18 1019  Resp    SpO2 94 % 10/08/18 1019    Last Pain:  Vitals:   10/08/18 1019  TempSrc: Tympanic  PainSc: Asleep         Complications: No apparent anesthesia complications

## 2018-10-08 NOTE — Anesthesia Post-op Follow-up Note (Signed)
Anesthesia QCDR form completed.        

## 2018-10-08 NOTE — Anesthesia Preprocedure Evaluation (Signed)
Anesthesia Evaluation  Patient identified by MRN, date of birth, ID band Patient awake    Reviewed: Allergy & Precautions, H&P , NPO status , Patient's Chart, lab work & pertinent test results  Airway Mallampati: II  TM Distance: >3 FB     Dental  (+) Poor Dentition, Missing, Chipped   Pulmonary COPD,  COPD inhaler, former smoker,           Cardiovascular hypertension, +CHF (chronic diastolic HF)  + dysrhythmias Atrial Fibrillation      Neuro/Psych CVA negative psych ROS   GI/Hepatic Neg liver ROS, GERD  Controlled,  Endo/Other  diabetes  Renal/GU negative Renal ROS  negative genitourinary   Musculoskeletal   Abdominal   Peds  Hematology  (+) Blood dyscrasia, anemia ,   Anesthesia Other Findings Past Medical History: 2017: Cancer ([place])     Comment:  skin  No date: COPD (chronic obstructive pulmonary disease) ([facility]) No date: Diabetes mellitus without complication ([facility]) No date: GERD (gastroesophageal reflux disease) No date: Hyperlipidemia No date: Hypertension No date: Mitral regurgitation No date: PAF (paroxysmal atrial fibrillation) ([facility]) No date: Stroke [facility])  Past Surgical History: 07/30/2018: CHOLECYSTECTOMY; N/A     Comment:  Procedure: LAPAROSCOPIC CHOLECYSTECTOMY WITH               INTRAOPERATIVE CHOLANGIOGRAM;  Surgeon: Greer Pickerel, MD;              [facility];  Service: General;  Laterality: N/A; 11/17/2017: COLONOSCOPY WITH PROPOFOL; N/A     Comment:  Procedure: COLONOSCOPY WITH PROPOFOL;  Surgeon: Jonathon Bellows, MD;  Location: [facility] ENDOSCOPY;  Service:               Gastroenterology;  Laterality: N/A; 08/14/2018: ENTEROSCOPY; N/A     Comment:  Procedure: ENTEROSCOPY;  Surgeon: Virgel Manifold,               MD;  Location: [facility] ENDOSCOPY;  Service: Endoscopy;                Laterality: N/A; 11/17/2017: ESOPHAGOGASTRODUODENOSCOPY (EGD) WITH PROPOFOL; N/A     Comment:   Procedure: ESOPHAGOGASTRODUODENOSCOPY (EGD) WITH               PROPOFOL;  Surgeon: Jonathon Bellows, MD;  Location: [facility]               ENDOSCOPY;  Service: Gastroenterology;  Laterality: N/A; 12/09/2017: GIVENS CAPSULE STUDY; N/A     Comment:  Procedure: GIVENS CAPSULE STUDY;  Surgeon: Jonathon Bellows,               MD;  Location: [facility] ENDOSCOPY;  Service:               Gastroenterology;  Laterality: N/A; No date: TONSILLECTOMY  BMI    Body Mass Index: 23.85 kg/m      Reproductive/Obstetrics negative OB ROS                             Anesthesia Physical Anesthesia Plan  ASA: III  Anesthesia Plan: General   Post-op Pain Management:    Induction:   PONV Risk Score and Plan: Propofol infusion and TIVA  Airway Management Planned: Nasal Cannula and Natural Airway  Additional Equipment:   Intra-op Plan:   Post-operative Plan:   Informed Consent: I have reviewed the patients History and Physical, chart, labs  and discussed the procedure including the risks, benefits and alternatives for the proposed anesthesia with the patient or authorized representative who has indicated his/her understanding and acceptance.     Dental Advisory Given  Plan Discussed with: Anesthesiologist and CRNA  Anesthesia Plan Comments:         Anesthesia Quick Evaluation

## 2018-10-09 NOTE — Anesthesia Postprocedure Evaluation (Signed)
Anesthesia Post Note  Patient: Gabriel Brewer  Procedure(s) Performed: COLONOSCOPY WITH PROPOFOL (N/A )  Patient location during evaluation: PACU Anesthesia Type: General Level of consciousness: awake and alert Pain management: pain level controlled Vital Signs Assessment: post-procedure vital signs reviewed and stable Respiratory status: spontaneous breathing, nonlabored ventilation and respiratory function stable Cardiovascular status: blood pressure returned to baseline and stable Postop Assessment: no apparent nausea or vomiting Anesthetic complications: no     Last Vitals:  Vitals:   10/08/18 1029 10/08/18 1039  BP: 126/76 135/79  Pulse: 72 71  Resp: 12 16  Temp:    SpO2: 97% 99%    Last Pain:  Vitals:   10/08/18 1039  TempSrc:   PainSc: 0-No pain                 Durenda Hurt

## 2018-10-12 LAB — SURGICAL PATHOLOGY

## 2018-10-21 DIAGNOSIS — Z23 Encounter for immunization: Secondary | ICD-10-CM | POA: Diagnosis not present

## 2018-10-26 ENCOUNTER — Encounter: Payer: Self-pay | Admitting: Family Medicine

## 2018-10-26 ENCOUNTER — Other Ambulatory Visit: Payer: Self-pay

## 2018-10-26 ENCOUNTER — Telehealth: Payer: Self-pay

## 2018-10-26 ENCOUNTER — Ambulatory Visit (INDEPENDENT_AMBULATORY_CARE_PROVIDER_SITE_OTHER): Payer: Medicare Other | Admitting: Family Medicine

## 2018-10-26 VITALS — BP 160/76 | HR 82 | Temp 98.4°F | Ht 71.0 in | Wt 173.8 lb

## 2018-10-26 DIAGNOSIS — R21 Rash and other nonspecific skin eruption: Secondary | ICD-10-CM

## 2018-10-26 DIAGNOSIS — I10 Essential (primary) hypertension: Secondary | ICD-10-CM

## 2018-10-26 MED ORDER — TRIAMCINOLONE ACETONIDE 0.1 % EX CREA: 1.0000 "application " | TOPICAL_CREAM | Freq: Two times a day (BID) | CUTANEOUS | 0 refills | Status: DC

## 2018-10-26 NOTE — Progress Notes (Signed)
Subjective:    Patient ID: Gabriel Brewer, male    DOB: Jun 15, 1940, 78 y.o.   MRN: [id number]  HPI Chief Complaint  Patient presents with  . Rash    Pt states that he feels he is having another Shingles outbreak, same as last visit 09/14/2018 - Pt states that the rash/?shingles is on the same arm, right elbow, started on Saturday. Pt states that he noticed it when he got out of the shower. Pt unable to do a virtual visit.   Marland Kitchen Hypertension    Discuss elevated BP    This is a 78 yo male who presents today with rash on right elbow x 3 days. Was seen 09/14/2018 with rash on right elbow suspicious for herpes zoster. Took course of valacyclovir with resolution in a couple of days. Now with new raised areas that itch. No drainage. Has not seen any blisters. No pain, does not remember any skin sensations prior to lesions appearing. Feels well, no fever, malaise, myalgias. He plans to get Shingrix vaccine in about 1 month. Has been applying otc hydrocortisone cream which helps with itch.   Hypertension- checking daily 116-149/62-80. When he was discharged from the hospital, his benazapril was discontinued.   Past Medical History:  Diagnosis Date  . Cancer ([place]) 2017   skin   . COPD (chronic obstructive pulmonary disease) ([place])   . Diabetes mellitus without complication ([place])   . GERD (gastroesophageal reflux disease)   . Hyperlipidemia   . Hypertension   . Mitral regurgitation   . PAF (paroxysmal atrial fibrillation) ([place])   . Stroke [facility])    Past Surgical History:  Procedure Laterality Date  . CHOLECYSTECTOMY N/A 07/30/2018   Procedure: LAPAROSCOPIC CHOLECYSTECTOMY WITH INTRAOPERATIVE CHOLANGIOGRAM;  Surgeon: Greer Pickerel, MD;  [facility]S;  Service: General;  Laterality: N/A;  . COLONOSCOPY WITH PROPOFOL N/A 11/17/2017   Procedure: COLONOSCOPY WITH PROPOFOL;  Surgeon: Jonathon Bellows, MD;  Location: [facility] ENDOSCOPY;  Service: Gastroenterology;  Laterality: N/A;  . COLONOSCOPY WITH PROPOFOL  N/A 10/08/2018   Procedure: COLONOSCOPY WITH PROPOFOL;  Surgeon: Jonathon Bellows, MD;  Location: [facility] ENDOSCOPY;  Service: Gastroenterology;  Laterality: N/A;  . ENTEROSCOPY N/A 08/14/2018   Procedure: ENTEROSCOPY;  Surgeon: Virgel Manifold, MD;  Location: [facility] ENDOSCOPY;  Service: Endoscopy;  Laterality: N/A;  . ESOPHAGOGASTRODUODENOSCOPY (EGD) WITH PROPOFOL N/A 11/17/2017   Procedure: ESOPHAGOGASTRODUODENOSCOPY (EGD) WITH PROPOFOL;  Surgeon: Jonathon Bellows, MD;  Location: [facility] ENDOSCOPY;  Service: Gastroenterology;  Laterality: N/A;  . GIVENS CAPSULE STUDY N/A 12/09/2017   Procedure: GIVENS CAPSULE STUDY;  Surgeon: Jonathon Bellows, MD;  Location: [facility] ENDOSCOPY;  Service: Gastroenterology;  Laterality: N/A;  . TONSILLECTOMY     Family History  Problem Relation Age of Onset  . Cerebral aneurysm Mother   . Heart disease Father   . COPD Father   . Breast cancer Sister   . Cancer Brother        unknowsn   Social History   Tobacco Use  . Smoking status: Former Smoker    Packs/day: 0.50    Years: 20.00    Pack years: 10.00    Types: Cigarettes    Quit date: 11/20/1982    Years since quitting: 35.9  . Smokeless tobacco: Never Used  Substance Use Topics  . Alcohol use: No  . Drug use: No     Review of Systems Per HPI    Objective:   Physical Exam Vitals signs reviewed.  Constitutional:      General: He  is not in acute distress.    Appearance: Normal appearance. He is normal weight. He is not ill-appearing, toxic-appearing or diaphoretic.  HENT:     Head: Normocephalic and atraumatic.  Eyes:     Conjunctiva/sclera: Conjunctivae normal.  Cardiovascular:     Rate and Rhythm: Normal rate.  Skin:    General: Skin is warm and dry.     Findings: Rash (left elbow with 4 mildly erythematous papules. No surrounding erythema. Slight scabbing on lesions. ) present.  Neurological:     Mental Status: He is alert and oriented to person, place, and time.  Psychiatric:        Mood and Affect:  Mood normal.        Behavior: Behavior normal.        Thought Content: Thought content normal.        Judgment: Judgment normal.       BP (!) 160/76 (BP Location: Left Arm, Patient Position: Sitting, Cuff Size: Normal)   Pulse 82   Temp 98.4 F (36.9 C) (Temporal)   Ht 5\' 11"  (1.803 m)   Wt 173 lb 12.8 oz (78.8 kg)   SpO2 98%   BMI 24.24 kg/m  Wt Readings from Last 3 Encounters:  10/26/18 173 lb 12.8 oz (78.8 kg)  10/08/18 171 lb (77.6 kg)  09/24/18 170 lb (77.1 kg)      Assessment & Plan:  1. Rash - prior rash in same area approximately 6 weeks ago. Low suspicion for recurrent shingles in absence of pain, vesicles. Will try stronger steroid cream and have encouraged him to cover at night if he is likely to scratch - Follow up if worsening, change in appearance or if no improvement in 3-5 days.  - triamcinolone cream (KENALOG) 0.1 %; Apply 1 application topically 2 (two) times daily.  Dispense: 30 g; Refill: 0  2. Essential hypertension - restart benazepril 40 mg, he will monitor daily BP and bring log with him to follow up in 1 month - having labs drawn by oncology in about 3 weeks, will ask them to add BMET   Clarene Reamer, FNP-BC  [facility], [place]  10/27/2018 9:39 AM

## 2018-10-26 NOTE — Telephone Encounter (Signed)
[place] Night - Client TELEPHONE ADVICE RECORD [facility] Patient Name: Gabriel Brewer Gender: Male DOB: May 15, 1940 Age: 78 Y 55 M 18 D Return Phone Number: [phone] (Primary), [phone] (Secondary) Address: City/State/Zip: [place] [address] Client [facility] Night - Client Client Site [place] Physician Tor Netters- NP Contact Type Call Who Is Calling Patient / Member / Family / Caregiver Call Type Triage / Clinical Caller Name Joycelyn Schmid Relationship To Patient Spouse Return Phone Number [phone] (Primary) Chief Complaint Rash - Localized Reason for Call Symptomatic / Request for [place] states husband has a rash on his elbow Translation No Nurse Assessment Nurse: Derrel Nip, RN, Santiago Glad Date/Time Eilene Ghazi Time): 10/24/2018 1:24:09 PM Confirm and document reason for call. If symptomatic, describe symptoms. ---Caller states her husband has a rash on his right elbow - was present when he woke up this morning - the rash is bumpy and red - where he has had shingles in the past - but it's not painful - about 1/2 dollar size Has the patient had close contact with a person known or suspected to have the novel coronavirus illness OR traveled / lives in area with major community spread (including international travel) in the last 14 days from the onset of symptoms? * If Asymptomatic, screen for exposure and travel within the last 14 days. ---No Does the patient have any new or worsening symptoms? ---Yes Will a triage be completed? ---Yes Related visit to physician within the last 2 weeks? ---No Does the PT have any chronic conditions? (i.e. diabetes, asthma, this includes High risk factors for pregnancy, etc.) ---Yes List chronic conditions. ---HTN, COPD, Pre Diabetic, Asthma Is this a behavioral health or substance abuse call? ---No Guidelines Guideline  Title Affirmed Question Affirmed Notes Nurse Date/Time (Eastern Time) Rash or Redness - Localized [1] Severe localized itching AND [2] after 2 days of steroid cream Derrel Nip, RN, Santiago Glad 10/24/2018 1:29:15 PM Disp. Time Eilene Ghazi Time) Disposition Final User PLEASE NOTE: All timestamps contained within this report are represented as [place] Standard Time. CONFIDENTIALTY NOTICE: This fax transmission is intended only for the addressee. It contains information that is legally privileged, confidential or otherwise protected from use or disclosure. If you are not the intended recipient, you are strictly prohibited from reviewing, disclosing, copying using or disseminating any of this information or taking any action in reliance on or regarding this information. If you have received this fax in error, please notify us immediately by telephone so that we can arrange for its return to [place]. Phone: [phone], Toll-Free: [phone], Fax: [phone] Page: 2 of 2 Call Id: [id number] 10/24/2018 1:33:04 PM SEE PCP WITHIN 3 DAYS Yes Derrel Nip, RN, [place] Pellant Disagree/Comply Comply Caller Understands Yes PreDisposition Call Doctor Care Advice Given Per Guideline SEE PCP WITHIN 3 DAYS: * You need to be seen within 2 or 3 days. Call your doctor (or NP/PA) during regular office hours and make an appointment. A clinic or urgent care center are good places to go for care if your doctor's office is closed or you can't get an appointment. NOTE: If office will be open tomorrow, tell caller to call then, not in 3 days. * IF PATIENT HAS NO PCP (PRIMARY CARE PROVIDER): A clinic or urgent care center are good places to go for care if you do not have a primary care provider. NOTE: Try to help caller find a PCP for future care (e.g., use a physician referral  line). Having a PCP or 'medical home' means better longterm care. AVOID THE CAUSE: * Try to find the cause. * Consider irritants like a plant (e.g., poison ivy or  evergreens), chemicals (e.g., solvents or insecticides), Fiberglass, a new cosmetic, or new jewelry (called contact dermatitis). LOCAL COLD: Apply ice or soak in cold water for 20 minutes every 3 or 4 hours to reduce itching or pain. HYDROCORTISONE CREAM FOR ITCHING: * You can use hydrocortisone for very itchy spots. * Put 1% hydrocortisone cream on the itchy area(s) 3 times a day. Use it for a couple days, until it feels better. This will help decrease the itching. * This is an over-the-counter (OTC) drug. You can buy it at the drugstore. * Some people like to keep the cream in the refrigerator. It feels even better if the cream is used when it is cold. ANTIHISTAMINE: If itching persists, take Benadryl by mouth (OTC) every 6 hours as needed. Adult dose: 25-50 mg. CAUTION - ANTIHISTAMINES: * Examples include diphenhydramine (Benadryl) and chlorpheniramine (Chlortrimeton, Chlor-tripolon) * May cause sleepiness. Do not drink alcohol, drive, or operate dangerous machinery while taking antihistamines. * Do not take these medicines if you have prostate problems. AVOID SCRATCHING: Try not to scratch. Cut your fingernails short. CALL BACK IF: * Rash becomes worse. CARE ADVICE given per Rash - Localized and Cause Unknown (Adult) guideline. Referrals REFERRED TO PCP OFFICE

## 2018-10-26 NOTE — Patient Instructions (Signed)
Apply cream to your elbow twice a day for up to 10 days.   Restart benazepril. Check your blood pressure daily the way you have been. Bring with you to your next appointment. If you have any lightheadedness, dizziness or low blood pressure, please let me know.

## 2018-10-26 NOTE — Telephone Encounter (Signed)
Noted  

## 2018-10-26 NOTE — Telephone Encounter (Signed)
I spoke with Gabriel Brewer [facility] signed) and pt is presently on the phone making an appt to be seen with virtual visit. Nothing further needed at this time. Pt has virtual appt scheduled today at 2 PM with Gabriel Chroman FNP.

## 2018-10-27 ENCOUNTER — Other Ambulatory Visit: Payer: Self-pay | Admitting: Oncology

## 2018-10-29 DIAGNOSIS — H34811 Central retinal vein occlusion, right eye, with macular edema: Secondary | ICD-10-CM | POA: Diagnosis not present

## 2018-10-29 DIAGNOSIS — H348322 Tributary (branch) retinal vein occlusion, left eye, stable: Secondary | ICD-10-CM | POA: Diagnosis not present

## 2018-11-08 ENCOUNTER — Encounter: Payer: Self-pay | Admitting: Gastroenterology

## 2018-11-09 DIAGNOSIS — I480 Paroxysmal atrial fibrillation: Secondary | ICD-10-CM | POA: Insufficient documentation

## 2018-11-09 DIAGNOSIS — I340 Nonrheumatic mitral (valve) insufficiency: Secondary | ICD-10-CM | POA: Diagnosis not present

## 2018-11-09 DIAGNOSIS — I10 Essential (primary) hypertension: Secondary | ICD-10-CM | POA: Diagnosis not present

## 2018-11-20 ENCOUNTER — Inpatient Hospital Stay: Payer: Medicare Other | Attending: Oncology

## 2018-11-20 ENCOUNTER — Other Ambulatory Visit: Payer: Self-pay

## 2018-11-20 DIAGNOSIS — D500 Iron deficiency anemia secondary to blood loss (chronic): Secondary | ICD-10-CM | POA: Diagnosis not present

## 2018-11-20 DIAGNOSIS — I480 Paroxysmal atrial fibrillation: Secondary | ICD-10-CM | POA: Insufficient documentation

## 2018-11-20 DIAGNOSIS — Z7901 Long term (current) use of anticoagulants: Secondary | ICD-10-CM | POA: Insufficient documentation

## 2018-11-20 DIAGNOSIS — Q2730 Arteriovenous malformation, site unspecified: Secondary | ICD-10-CM | POA: Diagnosis not present

## 2018-11-20 LAB — CBC WITH DIFFERENTIAL/PLATELET
Abs Immature Granulocytes: 0.03 10*3/uL (ref 0.00–0.07)
Basophils Absolute: 0.1 10*3/uL (ref 0.0–0.1)
Basophils Relative: 1 %
Eosinophils Absolute: 0.1 10*3/uL (ref 0.0–0.5)
Eosinophils Relative: 1 %
HCT: 46.4 % (ref 39.0–52.0)
Hemoglobin: 15.8 g/dL (ref 13.0–17.0)
Immature Granulocytes: 0 %
Lymphocytes Relative: 26 %
Lymphs Abs: 2.1 10*3/uL (ref 0.7–4.0)
MCH: 29.4 pg (ref 26.0–34.0)
MCHC: 34.1 g/dL (ref 30.0–36.0)
MCV: 86.4 fL (ref 80.0–100.0)
Monocytes Absolute: 0.6 10*3/uL (ref 0.1–1.0)
Monocytes Relative: 8 %
Neutro Abs: 5.1 10*3/uL (ref 1.7–7.7)
Neutrophils Relative %: 64 %
Platelets: 194 10*3/uL (ref 150–400)
RBC: 5.37 MIL/uL (ref 4.22–5.81)
RDW: 13.6 % (ref 11.5–15.5)
WBC: 8.1 10*3/uL (ref 4.0–10.5)
nRBC: 0 % (ref 0.0–0.2)

## 2018-11-20 LAB — COMPREHENSIVE METABOLIC PANEL
ALT: 31 U/L (ref 0–44)
AST: 31 U/L (ref 15–41)
Albumin: 4.3 g/dL (ref 3.5–5.0)
Alkaline Phosphatase: 70 U/L (ref 38–126)
Anion gap: 11 (ref 5–15)
BUN: 30 mg/dL — ABNORMAL HIGH (ref 8–23)
CO2: 20 mmol/L — ABNORMAL LOW (ref 22–32)
Calcium: 9.5 mg/dL (ref 8.9–10.3)
Chloride: 105 mmol/L (ref 98–111)
Creatinine, Ser: 1.23 mg/dL (ref 0.61–1.24)
GFR calc Af Amer: >60 mL/min (ref >60)
GFR calc non Af Amer: 56 mL/min — ABNORMAL LOW (ref >60)
Glucose, Bld: 121 mg/dL — ABNORMAL HIGH (ref 70–99)
Potassium: 4.5 mmol/L (ref 3.5–5.1)
Sodium: 136 mmol/L (ref 135–145)
Total Bilirubin: 0.7 mg/dL (ref 0.3–1.2)
Total Protein: 7.5 g/dL (ref 6.5–8.1)

## 2018-11-20 LAB — FERRITIN: Ferritin: 90 ng/mL (ref 24–336)

## 2018-11-20 LAB — IRON AND TIBC
Iron: 125 ug/dL (ref 45–182)
Saturation Ratios: 40 % — ABNORMAL HIGH (ref 17.9–39.5)
TIBC: 316 ug/dL (ref 250–450)
UIBC: 191 ug/dL

## 2018-11-20 NOTE — Progress Notes (Signed)
Patient pre screened for office appointment, no questions or concerns today. 

## 2018-11-23 ENCOUNTER — Inpatient Hospital Stay (HOSPITAL_BASED_OUTPATIENT_CLINIC_OR_DEPARTMENT_OTHER): Payer: Medicare Other | Admitting: Oncology

## 2018-11-23 ENCOUNTER — Ambulatory Visit (INDEPENDENT_AMBULATORY_CARE_PROVIDER_SITE_OTHER): Payer: Medicare Other | Admitting: Family Medicine

## 2018-11-23 ENCOUNTER — Inpatient Hospital Stay: Payer: Medicare Other

## 2018-11-23 ENCOUNTER — Ambulatory Visit: Payer: Medicare Other | Admitting: Family Medicine

## 2018-11-23 ENCOUNTER — Other Ambulatory Visit: Payer: Self-pay

## 2018-11-23 ENCOUNTER — Encounter: Payer: Self-pay | Admitting: Family Medicine

## 2018-11-23 ENCOUNTER — Encounter: Payer: Self-pay | Admitting: Oncology

## 2018-11-23 VITALS — BP 126/62 | HR 69 | Temp 98.1°F | Ht 71.0 in | Wt 175.8 lb

## 2018-11-23 VITALS — BP 125/65 | HR 64 | Temp 98.0°F | Resp 18 | Wt 175.1 lb

## 2018-11-23 DIAGNOSIS — I10 Essential (primary) hypertension: Secondary | ICD-10-CM | POA: Diagnosis not present

## 2018-11-23 DIAGNOSIS — Z7901 Long term (current) use of anticoagulants: Secondary | ICD-10-CM | POA: Diagnosis not present

## 2018-11-23 DIAGNOSIS — D500 Iron deficiency anemia secondary to blood loss (chronic): Secondary | ICD-10-CM

## 2018-11-23 DIAGNOSIS — Q2730 Arteriovenous malformation, site unspecified: Secondary | ICD-10-CM | POA: Diagnosis not present

## 2018-11-23 DIAGNOSIS — E118 Type 2 diabetes mellitus with unspecified complications: Secondary | ICD-10-CM | POA: Diagnosis not present

## 2018-11-23 DIAGNOSIS — I480 Paroxysmal atrial fibrillation: Secondary | ICD-10-CM | POA: Diagnosis not present

## 2018-11-23 LAB — POCT GLYCOSYLATED HEMOGLOBIN (HGB A1C): Hemoglobin A1C: 6.8 % — AB (ref 4.0–5.6)

## 2018-11-23 LAB — MICROALBUMIN / CREATININE URINE RATIO
Creatinine,U: 91.3 mg/dL
Microalb Creat Ratio: 0.8 mg/g (ref 0.0–30.0)
Microalb, Ur: <0.7 mg/dL (ref 0.0–1.9)

## 2018-11-23 NOTE — Patient Instructions (Signed)
Please schedule medicare annual wellness visit   Follow up with me in 6 months  I will notify you of lab results

## 2018-11-23 NOTE — Progress Notes (Signed)
Hematology/Oncology follow up  note [facility] [phone] [phone]   Patient Care Team: Elby Beck, FNP as PCP - General (Nurse Practitioner)  REFERRING PROVIDER: Napoleon Form CHIEF COMPLAINTS/REASON FOR VISIT:  Evaluation of iron deficiency anemia  HISTORY OF PRESENTING ILLNESS:  Gabriel Brewer is a  78 y.o.  male with PMH listed below who was referred to me for evaluation of iron deficiency anemia.  [name] for evaluation of GI bleeding.  Patient is on chronic anticoagulation Xarelto for atrial fibrillation. He noticed having intermittent black stools for the past couple of weeks. 11/12/2017, CBC showed hemoglobin 7.2, MCV 65.6. Reviewed patient previous labs.  Since March 2016, he has started to have mild anemia with MCV of 79.8.  History of skin cancer in 2017.  History of nonrheumatic mitral regurgitation and history of encarditis mitral valve.    He is s/p 1 unit of PRBC transfusion last week. Feels fatigue is slightly better. Mild SOB with exertion.  11/17/2017 endoscopy and colonoscopy showed 3 cm hiatal hernia, mucosal changes in the esophagus.  Nonbleeding internal hemorrhoids, diverticulosis in the sigmoid colon.  150 mm polyp in the cecum, removed with mucosal resection.  Examination otherwise normal. There is plan for video capsule endoscopy in 2 weeks.  Patient was referred to hematology for discussion of future management plan for iron deficiency anemia.  #12/17/2017 underwent capsule study which showed a small proximal small bowel AVM, nonbleeding noted.  Was suggested to have push enteroscopy to ablate the AVM.  #admitted from 08/12/2020 08/15/2018, due to symptomatic anemia secondary to GI bleeding from small bowel AVM.  He took Xarelto for atrial fibrillation.  Xarelto was held Status post blood transfusion and IV Venofer treatments in the hospital. CT abdomen showed status post cholecystectomy  persistent fluid collection in the gallbladder fossa.  He was seen by Dr. Windell Moment who recommended no intervention, outpatient follow-up. EGD was done during admission by Dr. Bonna Gains.  1 angiectasia in the duodenum, and was treated.  # Right renal 1.4 cm lesion: MRI showed 13 mm benign Bosniak category 2 cyst in midpole of right kidney, corresponding with lesion seen on previous CT. No evidence of renal neoplasm or hydronephrosis.  INTERVAL HISTORY Gabriel Brewer is a 78 y.o. male who has above history reviewed by me today presents for follow up visit for management of iron deficiency anemia Reports feeling well. Denies fatigue.  He has resumed anticoagulation, now on Eliquis 5mg  BID for A fib.  Denies hematochezia, hematuria, hematemesis, epistaxis, black tarry stool or easy bruising.    Review of Systems  Constitutional: Negative for chills, fever, malaise/fatigue and weight loss.  HENT: Negative for nosebleeds and sore throat.   Eyes: Negative for double vision, photophobia and redness.  Respiratory: Negative for cough, shortness of breath and wheezing.   Cardiovascular: Negative for chest pain, palpitations, orthopnea and leg swelling.  Gastrointestinal: Negative for abdominal pain, blood in stool, melena, nausea and vomiting.  Genitourinary: Negative for dysuria.  Musculoskeletal: Negative for back pain, myalgias and neck pain.  Skin: Negative for itching and rash.  Neurological: Negative for dizziness, tingling and tremors.  Endo/Heme/Allergies: Negative for environmental allergies. Does not bruise/bleed easily.  Psychiatric/Behavioral: Negative for depression and hallucinations.    MEDICAL HISTORY:  Past Medical History:  Diagnosis Date   Cancer ([place]) 2017   skin    COPD (chronic obstructive pulmonary disease) (HCC)    Diabetes mellitus without complication (HCC)    GERD (gastroesophageal  reflux disease)    Hyperlipidemia    Hypertension    Mitral regurgitation     PAF (paroxysmal atrial fibrillation) ([place])    Stroke [facility])     SURGICAL HISTORY: Past Surgical History:  Procedure Laterality Date   CHOLECYSTECTOMY N/A 07/30/2018   Procedure: LAPAROSCOPIC CHOLECYSTECTOMY WITH INTRAOPERATIVE CHOLANGIOGRAM;  Surgeon: Greer Pickerel, MD;  Location: [facility];  Service: General;  Laterality: N/A;   COLONOSCOPY WITH PROPOFOL N/A 11/17/2017   Procedure: COLONOSCOPY WITH PROPOFOL;  Surgeon: Jonathon Bellows, MD;  Location: [facility] ENDOSCOPY;  Service: Gastroenterology;  Laterality: N/A;   COLONOSCOPY WITH PROPOFOL N/A 10/08/2018   Procedure: COLONOSCOPY WITH PROPOFOL;  Surgeon: Jonathon Bellows, MD;  Location: [facility] ENDOSCOPY;  Service: Gastroenterology;  Laterality: N/A;   ENTEROSCOPY N/A 08/14/2018   Procedure: ENTEROSCOPY;  Surgeon: Virgel Manifold, MD;  Location: [facility] ENDOSCOPY;  Service: Endoscopy;  Laterality: N/A;   ESOPHAGOGASTRODUODENOSCOPY (EGD) WITH PROPOFOL N/A 11/17/2017   Procedure: ESOPHAGOGASTRODUODENOSCOPY (EGD) WITH PROPOFOL;  Surgeon: Jonathon Bellows, MD;  Location: [facility] ENDOSCOPY;  Service: Gastroenterology;  Laterality: N/A;   GIVENS CAPSULE STUDY N/A 12/09/2017   Procedure: GIVENS CAPSULE STUDY;  Surgeon: Jonathon Bellows, MD;  Location: [facility] ENDOSCOPY;  Service: Gastroenterology;  Laterality: N/A;   TONSILLECTOMY      SOCIAL HISTORY: Social History   Socioeconomic History   Marital status: Married    Spouse name: Not on file   Number of children: Not on file   Years of education: Not on file   Highest education level: Not on file  Occupational History   Occupation: retired  Scientist, product/process development strain: Not on file   Food insecurity    Worry: Not on file    Inability: Not on Lexicographer needs    Medical: Not on file    Non-medical: Not on file  Tobacco Use   Smoking status: Former Smoker    Packs/day: 0.50    Years: 20.00    Pack years: 10.00    Types: Cigarettes    Quit date: 11/20/1982    Years since  quitting: 36.0   Smokeless tobacco: Never Used  Substance and Sexual Activity   Alcohol use: No   Drug use: No   Sexual activity: Not Currently    Partners: Female  Lifestyle   Physical activity    Days per week: Not on file    Minutes per session: Not on file   Stress: Not on file  Relationships   Social connections    Talks on phone: Not on file    Gets together: Not on file    Attends religious service: Not on file    Active member of club or organization: Not on file    Attends meetings of clubs or organizations: Not on file    Relationship status: Not on file   Intimate partner violence    Fear of current or ex partner: Not on file    Emotionally abused: Not on file    Physically abused: Not on file    Forced sexual activity: Not on file  Other Topics Concern   Not on file  Social History Narrative   Not on file    FAMILY HISTORY: Family History  Problem Relation Age of Onset   Cerebral aneurysm Mother    Heart disease Father    COPD Father    Breast cancer Sister    Cancer Brother        unknowsn    ALLERGIES:  has  No Known Allergies.  MEDICATIONS:  Current Outpatient Medications  Medication Sig Dispense Refill   apixaban (ELIQUIS) 5 MG TABS tablet Take 1 tablet by mouth 2 (two) times daily.     atorvastatin (LIPITOR) 10 MG tablet Take 1 tablet (10 mg total) by mouth daily. 90 tablet 3   Cholecalciferol 4000 units CAPS Take 4,000 Units by mouth every Friday.      diltiazem (CARDIZEM CD) 180 MG 24 hr capsule Take 1 capsule (180 mg total) by mouth daily. 90 capsule 3   ferrous sulfate 325 (65 FE) MG tablet Take 1 tablet (325 mg total) by mouth 2 (two) times daily with a meal. 60 tablet 3   fluticasone (FLONASE) 50 MCG/ACT nasal spray Place 1 spray into both nostrils daily as needed for allergies.      hydrochlorothiazide (HYDRODIURIL) 12.5 MG tablet Take 12.5 mg by mouth daily.      omeprazole (PRILOSEC) 40 MG capsule Take 1 capsule (40  mg total) by mouth daily. 90 capsule 3   triamcinolone cream (KENALOG) 0.1 % Apply 1 application topically 2 (two) times daily. 30 g 0   No current facility-administered medications for this visit.      PHYSICAL EXAMINATION: ECOG PERFORMANCE STATUS: 0 - Asymptomatic Vitals:   11/23/18 1319  BP: 125/65  Pulse: 64  Resp: 18  Temp: 98 F (36.7 C)   Filed Weights   11/23/18 1319  Weight: 175 lb 1.6 oz (79.4 kg)    Physical Exam Constitutional:      General: He is not in acute distress. HENT:     Head: Normocephalic and atraumatic.  Eyes:     General: No scleral icterus.    Pupils: Pupils are equal, round, and reactive to light.  Neck:     Musculoskeletal: Normal range of motion and neck supple.  Cardiovascular:     Rate and Rhythm: Normal rate and regular rhythm.     Heart sounds: Normal heart sounds.  Pulmonary:     Effort: Pulmonary effort is normal. No respiratory distress.     Breath sounds: No wheezing.  Abdominal:     General: Bowel sounds are normal. There is no distension.     Palpations: Abdomen is soft. There is no mass.     Tenderness: There is no abdominal tenderness.  Musculoskeletal: Normal range of motion.        General: No deformity.  Skin:    General: Skin is warm and dry.     Findings: No erythema or rash.  Neurological:     Mental Status: He is alert and oriented to person, place, and time.     Cranial Nerves: No cranial nerve deficit.     Coordination: Coordination normal.  Psychiatric:        Behavior: Behavior normal.        Thought Content: Thought content normal.      LABORATORY DATA:  I have reviewed the data as listed Lab Results  Component Value Date   WBC 8.1 11/20/2018   HGB 15.8 11/20/2018   HCT 46.4 11/20/2018   MCV 86.4 11/20/2018   PLT 194 11/20/2018   Recent Labs    03/13/18 1957 03/16/18 1108 07/27/18 1557  08/13/18 0402 08/13/18 1348 11/20/18 1101  NA 136  --  140   < > 138 138 136  K 3.9  --  4.4   < > 4.1  3.9 4.5  CL 103  --  105   < > 108 109 105  CO2 24  --  23   < > 22 21* 20*  GLUCOSE 143*  --  108*   < > 132* 123* 121*  BUN 26*  --  29*   < > 36* 26* 30*  CREATININE 1.07  --  1.17   < > 1.06 1.04 1.23  CALCIUM 8.9  --  9.9   < > 8.3* 8.5* 9.5  GFRNONAA >60  --  59*   < > >60 >60 56*  GFRAA >60  --  >60   < > >60 >60 >60  PROT 6.8 7.4 7.5  --   --  5.9* 7.5  ALBUMIN 4.1 4.2 4.7  --   --  3.6 4.3  AST 20 16 23   --   --  17 31  ALT 17 14 18   --   --  16 31  ALKPHOS 52 68 62  --   --  53 70  BILITOT 0.7 1.1 0.6  --   --  1.1 0.7  BILIDIR 0.1 0.2  --   --   --   --   --   IBILI 0.6  --   --   --   --   --   --    < > = values in this interval not displayed.   Iron/TIBC/Ferritin/ %Sat    Component Value Date/Time   IRON 125 11/20/2018 1101   IRON 8 (LL) 11/13/2017 1415   TIBC 316 11/20/2018 1101   TIBC 395 11/13/2017 1415   FERRITIN 90 11/20/2018 1101   FERRITIN 5 (L) 11/13/2017 1415   IRONPCTSAT 40 (H) 11/20/2018 1101   IRONPCTSAT 2 (LL) 11/13/2017 1415     RADIOGRAPHIC STUDIES: I have personally reviewed the radiological images as listed and agreed with the findings in the report. No results found. ASSESSMENT & PLAN:  1. Iron deficiency anemia due to chronic blood loss   2. Chronic anticoagulation   Labs are reviewed and discussed with patient. Stable CBC and iron panel.  No need for additional IV venofer at this point.  He takes oral iron supplementation, and reports having diarrhea. . Advise patient to stop iron supplementation.    Chronic anticoagulation for atrial fibrillation On eliquis 5mg  BID.   All questions were answered. The patient knows to call the clinic with any problems questions or concerns.  Return of visit: 6 months     Earlie Server, MD, PhD Hematology Oncology [facility] Pager- [phone] 11/23/2018

## 2018-11-23 NOTE — Progress Notes (Signed)
Subjective:    Patient ID: Gabriel Brewer, male    DOB: 1940/12/09, 77 y.o.   MRN: [id number]  HPI This is a 78 yo male who presents today for follow up of chronic medical conditions.  Has follow up with oncology later today.   HTN- Has blood pressure log- 107-144/71-89.   Diabetes mellitus- Was taken off his metformin about 3 months ago as last A1c was 5.1. Blood sugars twice a day- 102-240. Most less than 150 in last month. Only a couple over 200. He does not significantly watch his diet. Reports that he has had quite a bit of Halloween candy recently. Drinks about 3-4 ounces of lower sugar OJ daily but otherwise no beverages with calories.   Right elbow rash- resolved after a couple of weeks.   ROS- no chest pain, breathing unchanged, no swelling of legs, fatigue intermittently.  Past Medical History:  Diagnosis Date  . Cancer ([place]) 2017   skin   . COPD (chronic obstructive pulmonary disease) ([place])   . Diabetes mellitus without complication ([place])   . GERD (gastroesophageal reflux disease)   . Hyperlipidemia   . Hypertension   . Mitral regurgitation   . PAF (paroxysmal atrial fibrillation) ([place])   . Stroke [facility])    Past Surgical History:  Procedure Laterality Date  . CHOLECYSTECTOMY N/A 07/30/2018   Procedure: LAPAROSCOPIC CHOLECYSTECTOMY WITH INTRAOPERATIVE CHOLANGIOGRAM;  Surgeon: Greer Pickerel, MD;  Location: [facility];  Service: General;  Laterality: N/A;  . COLONOSCOPY WITH PROPOFOL N/A 11/17/2017   Procedure: COLONOSCOPY WITH PROPOFOL;  Surgeon: Jonathon Bellows, MD;  Location: [facility] ENDOSCOPY;  Service: Gastroenterology;  Laterality: N/A;  . COLONOSCOPY WITH PROPOFOL N/A 10/08/2018   Procedure: COLONOSCOPY WITH PROPOFOL;  Surgeon: Jonathon Bellows, MD;  Location: [facility] ENDOSCOPY;  Service: Gastroenterology;  Laterality: N/A;  . ENTEROSCOPY N/A 08/14/2018   Procedure: ENTEROSCOPY;  Surgeon: Virgel Manifold, MD;  Location: [facility] ENDOSCOPY;  Service: Endoscopy;  Laterality: N/A;  .  ESOPHAGOGASTRODUODENOSCOPY (EGD) WITH PROPOFOL N/A 11/17/2017   Procedure: ESOPHAGOGASTRODUODENOSCOPY (EGD) WITH PROPOFOL;  Surgeon: Jonathon Bellows, MD;  Location: [facility] ENDOSCOPY;  Service: Gastroenterology;  Laterality: N/A;  . GIVENS CAPSULE STUDY N/A 12/09/2017   Procedure: GIVENS CAPSULE STUDY;  Surgeon: Jonathon Bellows, MD;  Location: [facility] ENDOSCOPY;  Service: Gastroenterology;  Laterality: N/A;  . TONSILLECTOMY     Family History  Problem Relation Age of Onset  . Cerebral aneurysm Mother   . Heart disease Father   . COPD Father   . Breast cancer Sister   . Cancer Brother        unknowsn   Social History   Tobacco Use  . Smoking status: Former Smoker    Packs/day: 0.50    Years: 20.00    Pack years: 10.00    Types: Cigarettes    Quit date: 11/20/1982    Years since quitting: 36.0  . Smokeless tobacco: Never Used  Substance Use Topics  . Alcohol use: No  . Drug use: No     Review of Systems     Objective:   Physical Exam Vitals signs reviewed.  Constitutional:      General: He is not in acute distress.    Appearance: Normal appearance. He is normal weight. He is not ill-appearing, toxic-appearing or diaphoretic.  HENT:     Head: Normocephalic and atraumatic.     Right Ear: External ear normal.     Left Ear: External ear normal.  Eyes:     Conjunctiva/sclera: Conjunctivae normal.  Cardiovascular:  Rate and Rhythm: Normal rate.  Pulmonary:     Effort: Pulmonary effort is normal.  Neurological:     Mental Status: He is alert and oriented to person, place, and time.  Psychiatric:        Mood and Affect: Mood normal.        Behavior: Behavior normal.        Thought Content: Thought content normal.        Judgment: Judgment normal.       BP 126/62 (BP Location: Left Arm, Patient Position: Sitting, Cuff Size: Normal)   Pulse 69   Temp 98.1 F (36.7 C) (Temporal)   Ht 5\' 11"  (1.803 m)   Wt 175 lb 12.8 oz (79.7 kg)   SpO2 98%   BMI 24.52 kg/m  Wt Readings  from Last 3 Encounters:  11/23/18 175 lb 12.8 oz (79.7 kg)  10/26/18 173 lb 12.8 oz (78.8 kg)  10/08/18 171 lb (77.6 kg)   Results for orders placed or performed in visit on 11/23/18  HgB A1c  Result Value Ref Range   Hemoglobin A1C 6.8 (A) 4.0 - 5.6 %   HbA1c POC (<> result, manual entry)     HbA1c, POC (prediabetic range)     HbA1c, POC (controlled diabetic range)         Assessment & Plan:  1. Controlled type 2 diabetes mellitus with complication, without long-term current use of insulin (HCC) - Hgba1c 6.8, continue off metformin. Encouraged him to have a healthy diet without excessive sugars/simple carbs - follow up in 6 months, can reduce checking his blood sugars to a couple of times a week - HgB A1c - Urine Microalbumin w/creat. ratio  2. Essential hypertension - well controlled, continue current meds  - needs AWV, 6 month follow up   Clarene Reamer, FNP-BC   Primary Care at [facility], [place]  11/23/2018 12:12 PM

## 2018-11-30 ENCOUNTER — Telehealth: Payer: Self-pay

## 2018-11-30 ENCOUNTER — Other Ambulatory Visit: Payer: Self-pay

## 2018-11-30 DIAGNOSIS — Z20828 Contact with and (suspected) exposure to other viral communicable diseases: Secondary | ICD-10-CM | POA: Diagnosis not present

## 2018-11-30 DIAGNOSIS — Z20822 Contact with and (suspected) exposure to covid-19: Secondary | ICD-10-CM

## 2018-11-30 NOTE — Telephone Encounter (Signed)
Pt went to restaurant on 11/27/18 and is not sure if pt was exposed to a person with covid. Earlier today pt got hoarse and diarrhea.pt has already gone to [facility] for covid testing earlier today.pt will self quarantine until gets lab results. FYI to [name].

## 2018-12-01 NOTE — Telephone Encounter (Signed)
Noted  

## 2018-12-02 LAB — NOVEL CORONAVIRUS, NAA: SARS-CoV-2, NAA: NOT DETECTED

## 2018-12-24 DIAGNOSIS — H348322 Tributary (branch) retinal vein occlusion, left eye, stable: Secondary | ICD-10-CM | POA: Diagnosis not present

## 2018-12-24 DIAGNOSIS — H34811 Central retinal vein occlusion, right eye, with macular edema: Secondary | ICD-10-CM | POA: Diagnosis not present

## 2019-02-18 DIAGNOSIS — H34811 Central retinal vein occlusion, right eye, with macular edema: Secondary | ICD-10-CM | POA: Diagnosis not present

## 2019-02-18 DIAGNOSIS — H348322 Tributary (branch) retinal vein occlusion, left eye, stable: Secondary | ICD-10-CM | POA: Diagnosis not present

## 2019-02-25 ENCOUNTER — Other Ambulatory Visit: Payer: Self-pay | Admitting: Family Medicine

## 2019-02-25 ENCOUNTER — Encounter: Payer: Self-pay | Admitting: Family Medicine

## 2019-02-25 DIAGNOSIS — E118 Type 2 diabetes mellitus with unspecified complications: Secondary | ICD-10-CM

## 2019-02-25 NOTE — Progress Notes (Signed)
po

## 2019-03-01 ENCOUNTER — Other Ambulatory Visit: Payer: Self-pay

## 2019-03-01 ENCOUNTER — Other Ambulatory Visit (INDEPENDENT_AMBULATORY_CARE_PROVIDER_SITE_OTHER): Payer: Medicare Other

## 2019-03-01 DIAGNOSIS — E118 Type 2 diabetes mellitus with unspecified complications: Secondary | ICD-10-CM

## 2019-03-01 LAB — POCT GLYCOSYLATED HEMOGLOBIN (HGB A1C): Hemoglobin A1C: 5.7 % — AB (ref 4.0–5.6)

## 2019-03-03 ENCOUNTER — Encounter: Payer: Self-pay | Admitting: Family Medicine

## 2019-03-03 MED ORDER — ATORVASTATIN CALCIUM 10 MG PO TABS: 10.0000 mg | ORAL_TABLET | Freq: Every day | ORAL | 1 refills | Status: DC

## 2019-04-15 DIAGNOSIS — H348322 Tributary (branch) retinal vein occlusion, left eye, stable: Secondary | ICD-10-CM | POA: Diagnosis not present

## 2019-04-15 DIAGNOSIS — H34811 Central retinal vein occlusion, right eye, with macular edema: Secondary | ICD-10-CM | POA: Diagnosis not present

## 2019-05-12 DIAGNOSIS — I10 Essential (primary) hypertension: Secondary | ICD-10-CM | POA: Diagnosis not present

## 2019-05-12 DIAGNOSIS — E782 Mixed hyperlipidemia: Secondary | ICD-10-CM | POA: Diagnosis not present

## 2019-05-12 DIAGNOSIS — I480 Paroxysmal atrial fibrillation: Secondary | ICD-10-CM | POA: Diagnosis not present

## 2019-05-12 DIAGNOSIS — R7309 Other abnormal glucose: Secondary | ICD-10-CM | POA: Diagnosis not present

## 2019-05-17 ENCOUNTER — Ambulatory Visit: Payer: Medicare Other

## 2019-05-17 ENCOUNTER — Telehealth: Payer: Self-pay

## 2019-05-17 NOTE — Telephone Encounter (Signed)
Called patient 3 times trying to complete his Medicare visit. Patient never answered. Left message notifying patient appointment was cancelled and can call back to reschedule.

## 2019-05-18 ENCOUNTER — Other Ambulatory Visit: Payer: Medicare Other

## 2019-05-24 ENCOUNTER — Ambulatory Visit (INDEPENDENT_AMBULATORY_CARE_PROVIDER_SITE_OTHER): Payer: Medicare Other | Admitting: Family Medicine

## 2019-05-24 ENCOUNTER — Other Ambulatory Visit: Payer: Self-pay

## 2019-05-24 ENCOUNTER — Encounter: Payer: Self-pay | Admitting: Family Medicine

## 2019-05-24 VITALS — BP 118/62 | HR 80 | Temp 97.7°F | Ht 71.0 in | Wt 171.8 lb

## 2019-05-24 DIAGNOSIS — I480 Paroxysmal atrial fibrillation: Secondary | ICD-10-CM | POA: Diagnosis not present

## 2019-05-24 DIAGNOSIS — M79605 Pain in left leg: Secondary | ICD-10-CM

## 2019-05-24 DIAGNOSIS — E118 Type 2 diabetes mellitus with unspecified complications: Secondary | ICD-10-CM | POA: Diagnosis not present

## 2019-05-24 DIAGNOSIS — K439 Ventral hernia without obstruction or gangrene: Secondary | ICD-10-CM | POA: Diagnosis not present

## 2019-05-24 DIAGNOSIS — Z7189 Other specified counseling: Secondary | ICD-10-CM

## 2019-05-24 DIAGNOSIS — I10 Essential (primary) hypertension: Secondary | ICD-10-CM

## 2019-05-24 NOTE — Patient Instructions (Addendum)
Good to see you today. Please schedule a follow up visit for 6 months.   Your labs from Dr. Gale Journey showed mildly decreased kidney function and need to drink more water. Remember that you should not be taking any NSAIDs while on Eliquis. Nsaids include Alleve, Advil, ibuprofen, naproxen. You can take tylenol if needed for pain/ fever. We will recheck in 6 months.   I recommend you get your shingles vaccine (shingrix) and tetanus vaccine from your pharmacy. They are covered under your prescription benefit.

## 2019-05-24 NOTE — Progress Notes (Signed)
Subjective:    Patient ID: Gabriel Brewer, male    DOB: [birth year], 79 y.o.   MRN: [id number]  HPI This is a 79 yo male who presents today for follow up of chronic medical conditions.   HTN- has been checking his blood pressure at least every day. Some readings low 110s. No dizziness. Drinks coffee and some water. Drinks black coffee, decaf.   Afib on Eliquis- denies s/s bleeding- hematuria, blood in BMx.   Pain in head, a couple of times a month, moves around. Not severe. Resolves spontaneously. No visual changes.   Pain left lateral lower leg. In well defined area, feels pulsing. Resolves with walking. Not getting worse. History of low back problems, thinks related. No weakness or falls.    Review of Systems Per HPI and denies chest pain, SOB, some urgent bowel movements since cholecystectomy, triggered by high fat foods/ coffee. No leg swelling.     Objective:   Physical Exam Vitals reviewed.  Constitutional:      General: He is not in acute distress.    Appearance: Normal appearance. He is normal weight. He is not ill-appearing, toxic-appearing or diaphoretic.  HENT:     Head: Normocephalic and atraumatic.     Right Ear: Tympanic membrane, ear canal and external ear normal.     Left Ear: Tympanic membrane, ear canal and external ear normal.  Eyes:     Conjunctiva/sclera: Conjunctivae normal.  Cardiovascular:     Rate and Rhythm: Normal rate and regular rhythm.     Pulses: Normal pulses.     Heart sounds: Normal heart sounds.  Pulmonary:     Effort: Pulmonary effort is normal.     Breath sounds: Normal breath sounds.  Abdominal:     General: Abdomen is flat. Bowel sounds are normal. There is no distension.     Palpations: Abdomen is soft. There is no mass.     Tenderness: There is no abdominal tenderness. There is no guarding or rebound.     Hernia: A hernia (vertical ventral hernia noted above ubilicus with abdominal muscle flexure.) is present.  Musculoskeletal:   Cervical back: Normal range of motion and neck supple.     Right lower leg: No edema.     Left lower leg: No edema.  Skin:    General: Skin is warm and dry.  Neurological:     Mental Status: He is alert and oriented to person, place, and time.  Psychiatric:        Mood and Affect: Mood normal.        Behavior: Behavior normal.        Thought Content: Thought content normal.        Judgment: Judgment normal.       BP 118/62 (BP Location: Left Arm, Patient Position: Sitting, Cuff Size: Normal)   Pulse 80   Temp 97.7 F (36.5 C) (Temporal)   Ht 5\' 11"  (1.803 m)   Wt 171 lb 12.8 oz (77.9 kg)   SpO2 96%   BMI 23.96 kg/m  Wt Readings from Last 3 Encounters:  05/24/19 171 lb 12.8 oz (77.9 kg)  11/23/18 175 lb 1.6 oz (79.4 kg)  11/23/18 175 lb 12.8 oz (79.7 kg)   BP Readings from Last 3 Encounters:  05/24/19 118/62  11/23/18 125/65  11/23/18 126/62   Diabetic Foot Exam - Simple   Simple Foot Form Diabetic Foot exam was performed with the following findings: Yes 05/24/2019 10:45 AM  Visual Inspection No deformities,  no ulcerations, no other skin breakdown bilaterally: Yes Sensation Testing Intact to touch and monofilament testing bilaterally: Yes Pulse Check Posterior Tibialis and Dorsalis pulse intact bilaterally: Yes Comments        Assessment & Plan:  1. PAF (paroxysmal atrial fibrillation) (HCC) - rate controlled, continue Eliquis, follow up with cardiology  2. Controlled type 2 diabetes mellitus with complication, without long-term current use of insulin ([place]) - last hgba1c 6.2 off meds  3. Essential hypertension - well controlled on current medications without side effects  4. Ventral hernia without obstruction or gangrene - per patient, has been present for years. Discussed follow up precautions.   5. Left leg pain - intermittent in nature, no red flags, will continue to monitor for any increased frequency or severity  6. Advanced care  planning/counseling discussion - discussed advanced life support with patient and provided him copy of MOST form to review. Encouraged him to talk with his wife and son.   - follow up in 6 months  He was given a jury duty excuse due to bowel urgency.   This visit occurred during the SARS-CoV-2 public health emergency.  Safety protocols were in place, including screening questions prior to the visit, additional usage of staff PPE, and extensive cleaning of exam room while observing appropriate contact time as indicated for disinfecting solutions.     Clarene Reamer, FNP-BC  [facility] at [facility]  05/24/2019 5:19 PM

## 2019-05-25 ENCOUNTER — Inpatient Hospital Stay (HOSPITAL_BASED_OUTPATIENT_CLINIC_OR_DEPARTMENT_OTHER): Payer: Medicare Other | Admitting: Oncology

## 2019-05-25 ENCOUNTER — Inpatient Hospital Stay: Payer: Medicare Other

## 2019-05-25 ENCOUNTER — Encounter: Payer: Self-pay | Admitting: Oncology

## 2019-05-25 ENCOUNTER — Inpatient Hospital Stay: Payer: Medicare Other | Attending: Oncology

## 2019-05-25 VITALS — BP 121/71 | HR 68 | Temp 96.6°F | Resp 18 | Wt 172.8 lb

## 2019-05-25 DIAGNOSIS — Z7901 Long term (current) use of anticoagulants: Secondary | ICD-10-CM

## 2019-05-25 DIAGNOSIS — Z79899 Other long term (current) drug therapy: Secondary | ICD-10-CM | POA: Insufficient documentation

## 2019-05-25 DIAGNOSIS — D500 Iron deficiency anemia secondary to blood loss (chronic): Secondary | ICD-10-CM | POA: Insufficient documentation

## 2019-05-25 LAB — CBC WITH DIFFERENTIAL/PLATELET
Abs Immature Granulocytes: 0.03 10*3/uL (ref 0.00–0.07)
Basophils Absolute: 0.1 10*3/uL (ref 0.0–0.1)
Basophils Relative: 1 %
Eosinophils Absolute: 0.1 10*3/uL (ref 0.0–0.5)
Eosinophils Relative: 2 %
HCT: 43.6 % (ref 39.0–52.0)
Hemoglobin: 15.3 g/dL (ref 13.0–17.0)
Immature Granulocytes: 1 %
Lymphocytes Relative: 27 %
Lymphs Abs: 1.8 10*3/uL (ref 0.7–4.0)
MCH: 30.5 pg (ref 26.0–34.0)
MCHC: 35.1 g/dL (ref 30.0–36.0)
MCV: 87 fL (ref 80.0–100.0)
Monocytes Absolute: 0.7 10*3/uL (ref 0.1–1.0)
Monocytes Relative: 10 %
Neutro Abs: 3.9 10*3/uL (ref 1.7–7.7)
Neutrophils Relative %: 59 %
Platelets: 199 10*3/uL (ref 150–400)
RBC: 5.01 MIL/uL (ref 4.22–5.81)
RDW: 13.4 % (ref 11.5–15.5)
WBC: 6.5 10*3/uL (ref 4.0–10.5)
nRBC: 0 % (ref 0.0–0.2)

## 2019-05-25 LAB — FERRITIN: Ferritin: 92 ng/mL (ref 24–336)

## 2019-05-25 LAB — IRON AND TIBC
Iron: 86 ug/dL (ref 45–182)
Saturation Ratios: 30 % (ref 17.9–39.5)
TIBC: 291 ug/dL (ref 250–450)
UIBC: 205 ug/dL

## 2019-05-25 NOTE — Progress Notes (Signed)
Patient denies problems/concerns today.   

## 2019-05-25 NOTE — Progress Notes (Signed)
Hematology/Oncology follow up  note [facility] [phone] [phone]   Patient Care Team: Elby Beck, FNP as PCP - General (Nurse Practitioner)  REFERRING PROVIDER: Napoleon Form CHIEF COMPLAINTS/REASON FOR VISIT:  Evaluation of iron deficiency anemia  HISTORY OF PRESENTING ILLNESS:  Gabriel Brewer is a  79 y.o.  male with PMH listed below who was referred to me for evaluation of iron deficiency anemia.  [name] for evaluation of GI bleeding.  Patient is on chronic anticoagulation Xarelto for atrial fibrillation. He noticed having intermittent black stools for the past couple of weeks. 11/12/2017, CBC showed hemoglobin 7.2, MCV 65.6. Reviewed patient previous labs.  Since March 2016, he has started to have mild anemia with MCV of 79.8.  History of skin cancer in 2017.  History of nonrheumatic mitral regurgitation and history of encarditis mitral valve.    He is s/p 1 unit of PRBC transfusion last week. Feels fatigue is slightly better. Mild SOB with exertion.  11/17/2017 endoscopy and colonoscopy showed 3 cm hiatal hernia, mucosal changes in the esophagus.  Nonbleeding internal hemorrhoids, diverticulosis in the sigmoid colon.  150 mm polyp in the cecum, removed with mucosal resection.  Examination otherwise normal. There is plan for video capsule endoscopy in 2 weeks.  Patient was referred to hematology for discussion of future management plan for iron deficiency anemia.  #12/17/2017 underwent capsule study which showed a small proximal small bowel AVM, nonbleeding noted.  Was suggested to have push enteroscopy to ablate the AVM.  #admitted from 08/12/2020 08/15/2018, due to symptomatic anemia secondary to GI bleeding from small bowel AVM.  He took Xarelto for atrial fibrillation.  Xarelto was held Status post blood transfusion and IV Venofer treatments in the hospital. CT abdomen showed status post cholecystectomy  persistent fluid collection in the gallbladder fossa.  He was seen by Dr. Windell Moment who recommended no intervention, outpatient follow-up. EGD was done during admission by Dr. Bonna Gains.  1 angiectasia in the duodenum, and was treated.  # Right renal 1.4 cm lesion: MRI showed 13 mm benign Bosniak category 2 cyst in midpole of right kidney, corresponding with lesion seen on previous CT. No evidence of renal neoplasm or hydronephrosis.  INTERVAL HISTORY Gabriel Brewer is a 79 y.o. male who has above history reviewed by me today presents for follow up visit for management of iron deficiency anemia Reports feeling well.  He is not currently taking any oral iron supplementation. He is on chronic anticoagulation with Eliquis 5 mg twice daily for A. fib.  Denies hematochezia, hematuria, hematemesis, epistaxis, black tarry stool or easy bruising.    Review of Systems  Constitutional: Negative for chills, fever, malaise/fatigue and weight loss.  HENT: Negative for nosebleeds and sore throat.   Eyes: Negative for double vision, photophobia and redness.  Respiratory: Negative for cough, shortness of breath and wheezing.   Cardiovascular: Negative for chest pain, palpitations, orthopnea and leg swelling.  Gastrointestinal: Negative for abdominal pain, blood in stool, melena, nausea and vomiting.  Genitourinary: Negative for dysuria.  Musculoskeletal: Negative for back pain, myalgias and neck pain.  Skin: Negative for itching and rash.  Neurological: Negative for dizziness, tingling and tremors.  Endo/Heme/Allergies: Negative for environmental allergies. Does not bruise/bleed easily.  Psychiatric/Behavioral: Negative for depression and hallucinations.    MEDICAL HISTORY:  Past Medical History:  Diagnosis Date  . Cancer ([place]) 2017   skin   . COPD (chronic obstructive pulmonary disease) ([place])   . Diabetes  mellitus without complication ([place])   . GERD (gastroesophageal reflux disease)   .  Hyperlipidemia   . Hypertension   . Mitral regurgitation   . PAF (paroxysmal atrial fibrillation) ([place])   . Stroke [facility])     SURGICAL HISTORY: Past Surgical History:  Procedure Laterality Date  . CHOLECYSTECTOMY N/A 07/30/2018   Procedure: LAPAROSCOPIC CHOLECYSTECTOMY WITH INTRAOPERATIVE CHOLANGIOGRAM;  Surgeon: Greer Pickerel, MD;  Location: [facility] ORS;  Service: General;  Laterality: N/A;  . COLONOSCOPY WITH PROPOFOL N/A 11/17/2017   Procedure: COLONOSCOPY WITH PROPOFOL;  Surgeon: Jonathon Bellows, MD;  Location: [facility] ENDOSCOPY;  Service: Gastroenterology;  Laterality: N/A;  . COLONOSCOPY WITH PROPOFOL N/A 10/08/2018   Procedure: COLONOSCOPY WITH PROPOFOL;  Surgeon: Jonathon Bellows, MD;  Location: [facility] ENDOSCOPY;  Service: Gastroenterology;  Laterality: N/A;  . ENTEROSCOPY N/A 08/14/2018   Procedure: ENTEROSCOPY;  Surgeon: Virgel Manifold, MD;  Location: [facility] ENDOSCOPY;  Service: Endoscopy;  Laterality: N/A;  . ESOPHAGOGASTRODUODENOSCOPY (EGD) WITH PROPOFOL N/A 11/17/2017   Procedure: ESOPHAGOGASTRODUODENOSCOPY (EGD) WITH PROPOFOL;  Surgeon: Jonathon Bellows, MD;  Location: [facility] ENDOSCOPY;  Service: Gastroenterology;  Laterality: N/A;  . GIVENS CAPSULE STUDY N/A 12/09/2017   Procedure: GIVENS CAPSULE STUDY;  Surgeon: Jonathon Bellows, MD;  Location: [facility] ENDOSCOPY;  Service: Gastroenterology;  Laterality: N/A;  . TONSILLECTOMY      SOCIAL HISTORY: Social History   Socioeconomic History  . Marital status: Married    Spouse name: Not on file  . Number of children: Not on file  . Years of education: Not on file  . Highest education level: Not on file  Occupational History  . Occupation: retired  Tobacco Use  . Smoking status: Former Smoker    Packs/day: 0.50    Years: 20.00    Pack years: 10.00    Types: Cigarettes    Quit date: 11/20/1982    Years since quitting: 36.5  . Smokeless tobacco: Never Used  Substance and Sexual Activity  . Alcohol use: No  . Drug use: No  . Sexual activity: Not  Currently    Partners: Female  Other Topics Concern  . Not on file  Social History Narrative   Would want CPR, artificial ventilation temporarily if needed.    Social Determinants of Health   Financial Resource Strain:   . Difficulty of Paying Living Expenses:   Food Insecurity:   . Worried About Charity fundraiser in the Last Year:   . Arboriculturist in the Last Year:   Transportation Needs:   . Film/video editor (Medical):   Marland Kitchen Lack of Transportation (Non-Medical):   Physical Activity:   . Days of Exercise per Week:   . Minutes of Exercise per Session:   Stress:   . Feeling of Stress :   Social Connections:   . Frequency of Communication with Friends and Family:   . Frequency of Social Gatherings with Friends and Family:   . Attends Religious Services:   . Active Member of Clubs or Organizations:   . Attends Archivist Meetings:   Marland Kitchen Marital Status:   Intimate Partner Violence:   . Fear of Current or Ex-Partner:   . Emotionally Abused:   Marland Kitchen Physically Abused:   . Sexually Abused:     FAMILY HISTORY: Family History  Problem Relation Age of Onset  . Cerebral aneurysm Mother   . Heart disease Father   . COPD Father   . Breast cancer Sister   . Cancer Brother  unknowsn    ALLERGIES:  has No Known Allergies.  MEDICATIONS:  Current Outpatient Medications  Medication Sig Dispense Refill  . apixaban (ELIQUIS) 5 MG TABS tablet Take 1 tablet by mouth 2 (two) times daily.    Marland Kitchen atorvastatin (LIPITOR) 10 MG tablet Take 1 tablet (10 mg total) by mouth daily. 90 tablet 1  . Cholecalciferol 4000 units CAPS Take 4,000 Units by mouth every Friday.     . diltiazem (CARDIZEM CD) 180 MG 24 hr capsule Take 1 capsule (180 mg total) by mouth daily. 90 capsule 3  . fluticasone (FLONASE) 50 MCG/ACT nasal spray Place 1 spray into both nostrils daily as needed for allergies.     . hydrochlorothiazide (HYDRODIURIL) 12.5 MG tablet Take 12.5 mg by mouth daily.     Marland Kitchen  omeprazole (PRILOSEC) 40 MG capsule Take 1 capsule (40 mg total) by mouth daily. 90 capsule 3   No current facility-administered medications for this visit.     PHYSICAL EXAMINATION: ECOG PERFORMANCE STATUS: 0 - Asymptomatic Vitals:   05/25/19 1316  BP: 121/71  Pulse: 68  Resp: 18  Temp: (!) 96.6 F (35.9 C)   Filed Weights   05/25/19 1316  Weight: 172 lb 12.8 oz (78.4 kg)    Physical Exam Constitutional:      General: He is not in acute distress. HENT:     Head: Normocephalic and atraumatic.  Eyes:     General: No scleral icterus.    Pupils: Pupils are equal, round, and reactive to light.  Cardiovascular:     Rate and Rhythm: Normal rate and regular rhythm.     Heart sounds: Normal heart sounds.  Pulmonary:     Effort: Pulmonary effort is normal. No respiratory distress.     Breath sounds: No wheezing.  Abdominal:     General: Bowel sounds are normal. There is no distension.     Palpations: Abdomen is soft. There is no mass.     Tenderness: There is no abdominal tenderness.  Musculoskeletal:        General: No deformity. Normal range of motion.     Cervical back: Normal range of motion and neck supple.  Skin:    General: Skin is warm and dry.     Findings: No erythema or rash.  Neurological:     Mental Status: He is alert and oriented to person, place, and time. Mental status is at baseline.     Cranial Nerves: No cranial nerve deficit.     Coordination: Coordination normal.  Psychiatric:        Mood and Affect: Mood normal.        Behavior: Behavior normal.        Thought Content: Thought content normal.      LABORATORY DATA:  I have reviewed the data as listed Lab Results  Component Value Date   WBC 6.5 05/25/2019   HGB 15.3 05/25/2019   HCT 43.6 05/25/2019   MCV 87.0 05/25/2019   PLT 199 05/25/2019   Recent Labs    07/27/18 1557 08/12/18 2154 08/13/18 0402 08/13/18 1348 11/20/18 1101  NA 140   < > 138 138 136  K 4.4   < > 4.1 3.9 4.5  CL  105   < > 108 109 105  CO2 23   < > 22 21* 20*  GLUCOSE 108*   < > 132* 123* 121*  BUN 29*   < > 36* 26* 30*  CREATININE 1.17   < >  1.06 1.04 1.23  CALCIUM 9.9   < > 8.3* 8.5* 9.5  GFRNONAA 59*   < > >60 >60 56*  GFRAA >60   < > >60 >60 >60  PROT 7.5  --   --  5.9* 7.5  ALBUMIN 4.7  --   --  3.6 4.3  AST 23  --   --  17 31  ALT 18  --   --  16 31  ALKPHOS 62  --   --  53 70  BILITOT 0.6  --   --  1.1 0.7   < > = values in this interval not displayed.   Iron/TIBC/Ferritin/ %Sat    Component Value Date/Time   IRON 125 11/20/2018 1101   IRON 8 (LL) 11/13/2017 1415   TIBC 316 11/20/2018 1101   TIBC 395 11/13/2017 1415   FERRITIN 90 11/20/2018 1101   FERRITIN 5 (L) 11/13/2017 1415   IRONPCTSAT 40 (H) 11/20/2018 1101   IRONPCTSAT 2 (LL) 11/13/2017 1415     RADIOGRAPHIC STUDIES: I have personally reviewed the radiological images as listed and agreed with the findings in the report. No results found. ASSESSMENT & PLAN:  1. Iron deficiency anemia due to chronic blood loss   2. Chronic anticoagulation    History of iron deficiency anemia due to blood loss. Labs reviewed and discussed with patient. Stable hemoglobin. Iron panels are pending. Assuming that his iron store is stable, patient appears to be stable on current anticoagulation. Suggest patient to follow-up with primary care provider periodically to check his hemoglobin and iron stores. He will be discharged from my clinic.  I will see him in the future if he develops additional related concerns or symptoms Patient agrees with the plan. All questions were answered. The patient knows to call the clinic with any problems questions or concerns.      Earlie Server, MD, PhD Hematology Oncology [facility] at [facility] Pager- [phone] 05/25/2019

## 2019-06-17 DIAGNOSIS — H348322 Tributary (branch) retinal vein occlusion, left eye, stable: Secondary | ICD-10-CM | POA: Diagnosis not present

## 2019-06-17 DIAGNOSIS — H34811 Central retinal vein occlusion, right eye, with macular edema: Secondary | ICD-10-CM | POA: Diagnosis not present

## 2019-06-29 DIAGNOSIS — H348322 Tributary (branch) retinal vein occlusion, left eye, stable: Secondary | ICD-10-CM | POA: Diagnosis not present

## 2019-06-29 DIAGNOSIS — H10231 Serous conjunctivitis, except viral, right eye: Secondary | ICD-10-CM | POA: Diagnosis not present

## 2019-06-29 DIAGNOSIS — H2513 Age-related nuclear cataract, bilateral: Secondary | ICD-10-CM | POA: Diagnosis not present

## 2019-06-29 DIAGNOSIS — E113293 Type 2 diabetes mellitus with mild nonproliferative diabetic retinopathy without macular edema, bilateral: Secondary | ICD-10-CM | POA: Diagnosis not present

## 2019-06-29 DIAGNOSIS — H348112 Central retinal vein occlusion, right eye, stable: Secondary | ICD-10-CM | POA: Diagnosis not present

## 2019-06-29 DIAGNOSIS — H35372 Puckering of macula, left eye: Secondary | ICD-10-CM | POA: Diagnosis not present

## 2019-08-12 DIAGNOSIS — H348322 Tributary (branch) retinal vein occlusion, left eye, stable: Secondary | ICD-10-CM | POA: Diagnosis not present

## 2019-08-12 DIAGNOSIS — H34811 Central retinal vein occlusion, right eye, with macular edema: Secondary | ICD-10-CM | POA: Diagnosis not present

## 2019-08-26 ENCOUNTER — Other Ambulatory Visit: Payer: Self-pay | Admitting: Family Medicine

## 2019-09-01 DIAGNOSIS — R7309 Other abnormal glucose: Secondary | ICD-10-CM | POA: Diagnosis not present

## 2019-09-01 DIAGNOSIS — R197 Diarrhea, unspecified: Secondary | ICD-10-CM | POA: Diagnosis not present

## 2019-09-01 DIAGNOSIS — I480 Paroxysmal atrial fibrillation: Secondary | ICD-10-CM | POA: Diagnosis not present

## 2019-09-01 DIAGNOSIS — Z9049 Acquired absence of other specified parts of digestive tract: Secondary | ICD-10-CM | POA: Diagnosis not present

## 2019-09-01 DIAGNOSIS — Z0000 Encounter for general adult medical examination without abnormal findings: Secondary | ICD-10-CM | POA: Diagnosis not present

## 2019-09-01 DIAGNOSIS — E782 Mixed hyperlipidemia: Secondary | ICD-10-CM | POA: Diagnosis not present

## 2019-09-01 DIAGNOSIS — I10 Essential (primary) hypertension: Secondary | ICD-10-CM | POA: Diagnosis not present

## 2019-09-07 DIAGNOSIS — L570 Actinic keratosis: Secondary | ICD-10-CM | POA: Diagnosis not present

## 2019-09-07 DIAGNOSIS — L821 Other seborrheic keratosis: Secondary | ICD-10-CM | POA: Diagnosis not present

## 2019-09-07 DIAGNOSIS — D225 Melanocytic nevi of trunk: Secondary | ICD-10-CM | POA: Diagnosis not present

## 2019-09-07 DIAGNOSIS — D485 Neoplasm of uncertain behavior of skin: Secondary | ICD-10-CM | POA: Diagnosis not present

## 2019-09-07 DIAGNOSIS — Z85828 Personal history of other malignant neoplasm of skin: Secondary | ICD-10-CM | POA: Diagnosis not present

## 2019-09-07 DIAGNOSIS — D2272 Melanocytic nevi of left lower limb, including hip: Secondary | ICD-10-CM | POA: Diagnosis not present

## 2019-09-07 DIAGNOSIS — X32XXXA Exposure to sunlight, initial encounter: Secondary | ICD-10-CM | POA: Diagnosis not present

## 2019-09-07 DIAGNOSIS — D2271 Melanocytic nevi of right lower limb, including hip: Secondary | ICD-10-CM | POA: Diagnosis not present

## 2019-09-07 DIAGNOSIS — L859 Epidermal thickening, unspecified: Secondary | ICD-10-CM | POA: Diagnosis not present

## 2019-09-12 IMAGING — CR DG CHEST 2V
2 series · 2 of 2 positions shown · non-contrast
Comparison: 11/05/2016

CLINICAL DATA: Cough and congestion

EXAM:
CHEST - 2 VIEW

[chest pa]
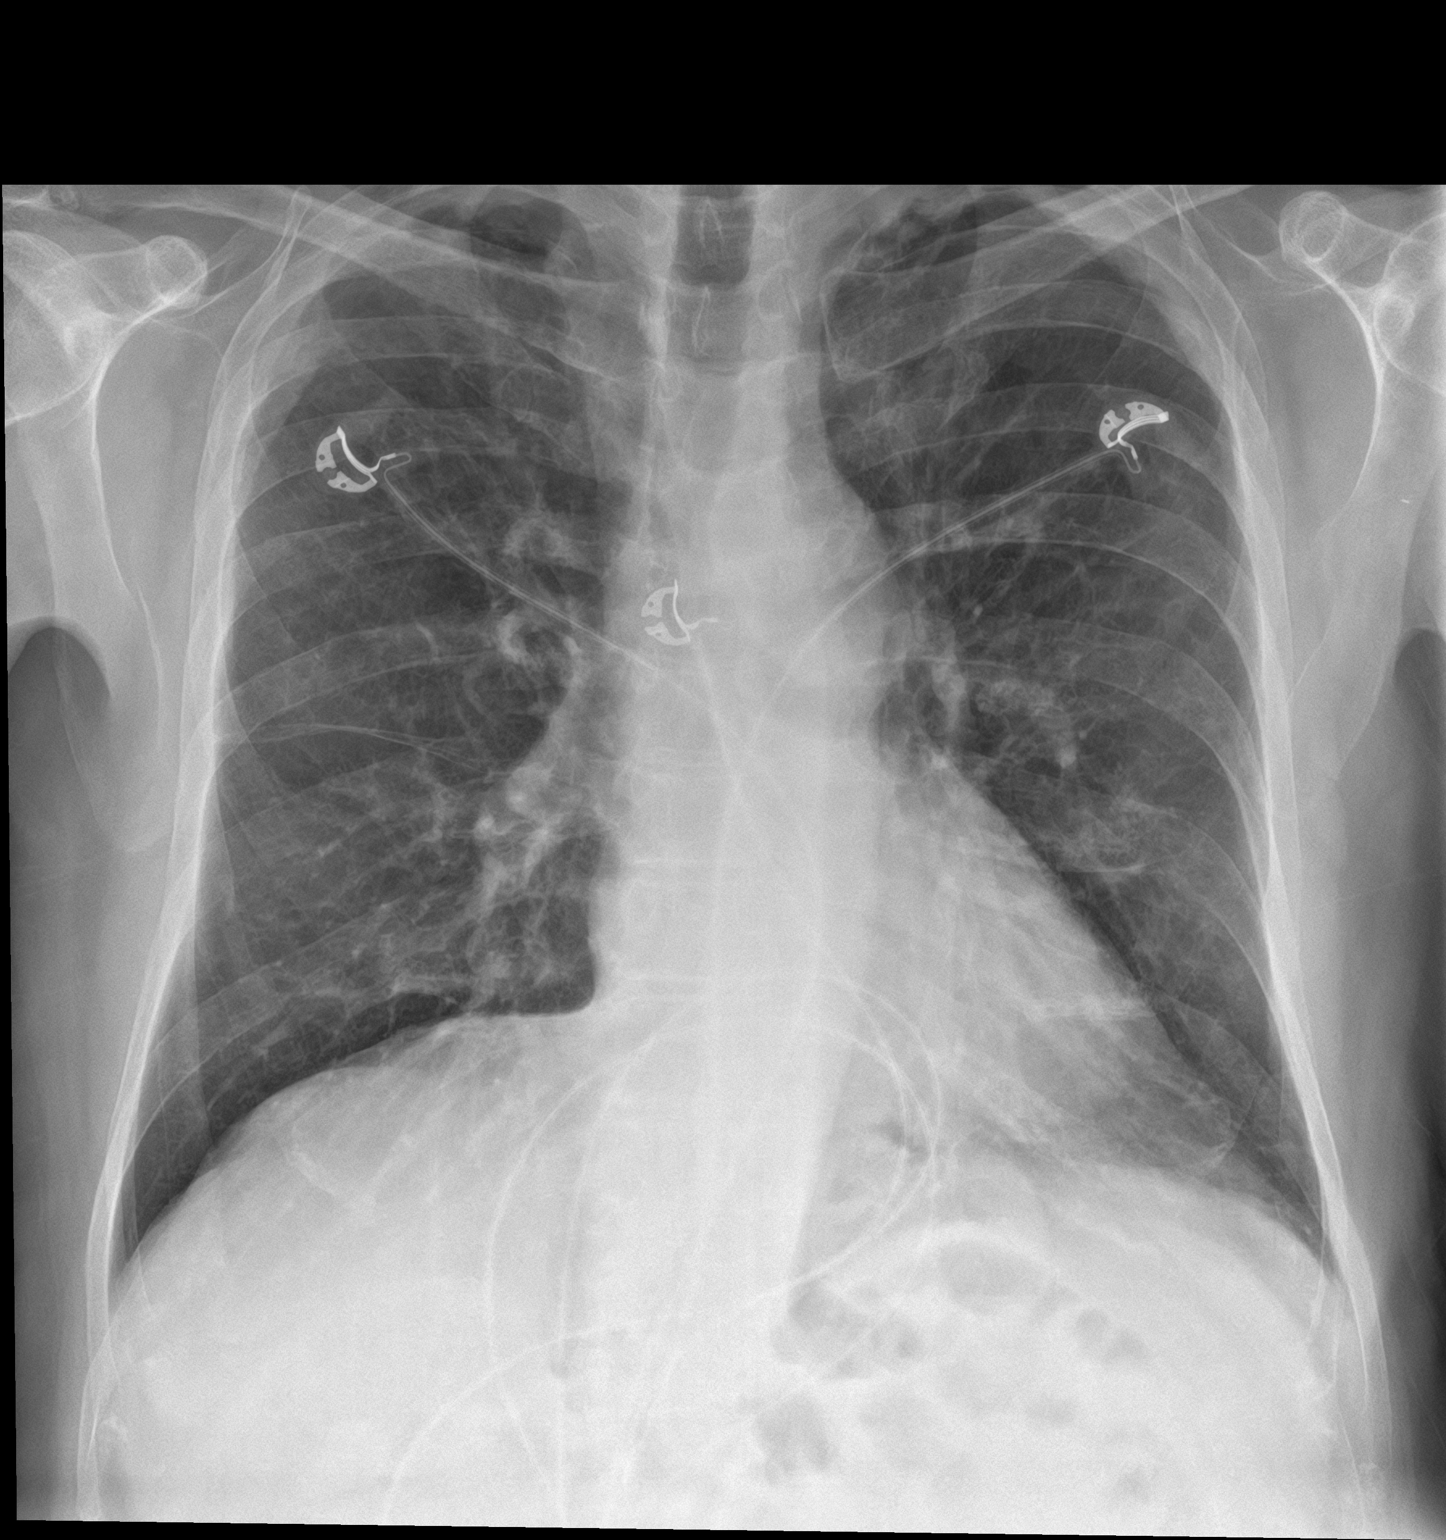

[chest lat]
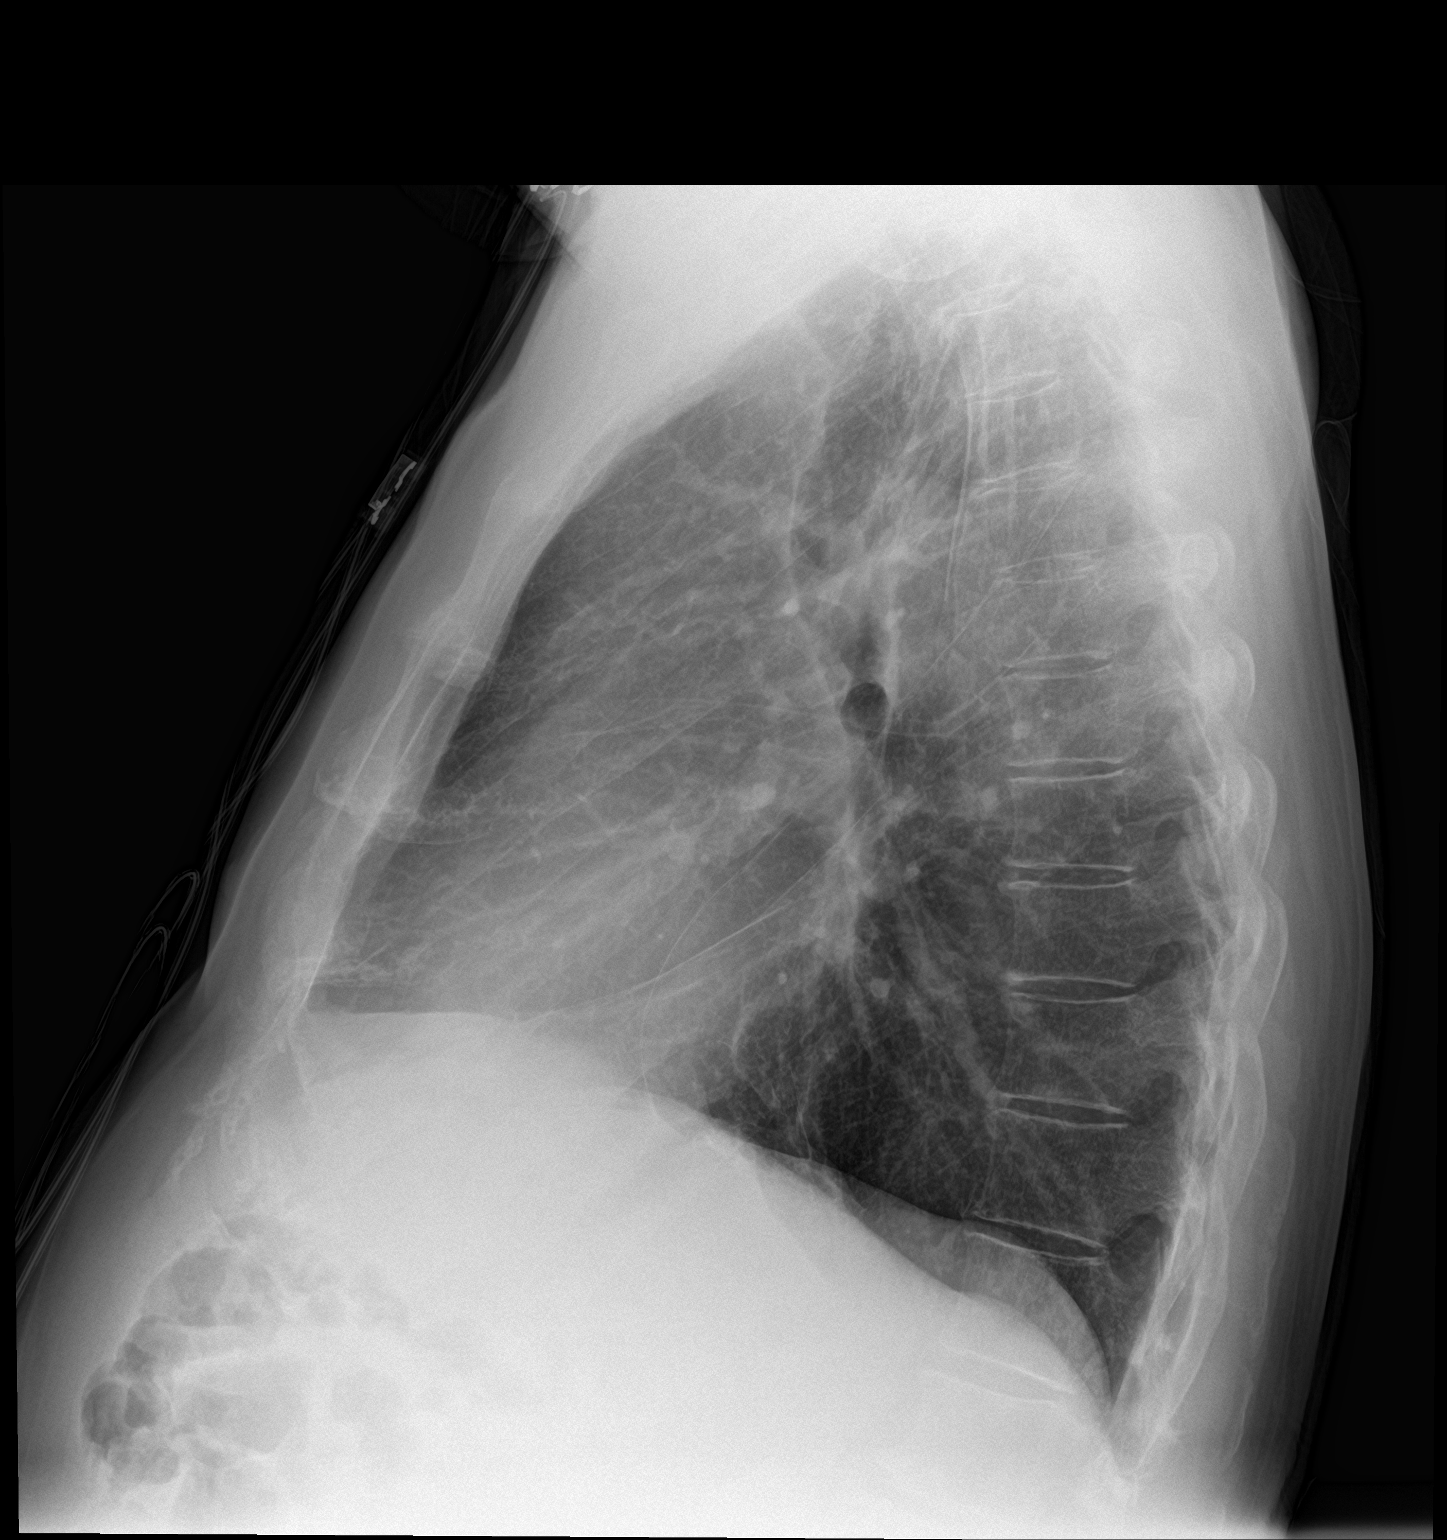

[2 of 2 positions shown; findings below may reference images not displayed]

FINDINGS: Cardiac shadows within normal limits. The lungs are well aerated
bilaterally. Biapical scarring is again seen. No focal infiltrate or
sizable effusion is noted. No acute bony abnormality is seen.
IMPRESSION: No acute abnormality noted.

## 2019-09-13 ENCOUNTER — Other Ambulatory Visit: Payer: Self-pay

## 2019-09-13 ENCOUNTER — Emergency Department: Payer: Medicare Other

## 2019-09-13 ENCOUNTER — Telehealth: Payer: Self-pay

## 2019-09-13 DIAGNOSIS — Z85828 Personal history of other malignant neoplasm of skin: Secondary | ICD-10-CM | POA: Insufficient documentation

## 2019-09-13 DIAGNOSIS — E119 Type 2 diabetes mellitus without complications: Secondary | ICD-10-CM | POA: Diagnosis not present

## 2019-09-13 DIAGNOSIS — R519 Headache, unspecified: Secondary | ICD-10-CM | POA: Diagnosis not present

## 2019-09-13 DIAGNOSIS — Z87891 Personal history of nicotine dependence: Secondary | ICD-10-CM | POA: Insufficient documentation

## 2019-09-13 DIAGNOSIS — I10 Essential (primary) hypertension: Secondary | ICD-10-CM | POA: Insufficient documentation

## 2019-09-13 DIAGNOSIS — Z7901 Long term (current) use of anticoagulants: Secondary | ICD-10-CM | POA: Insufficient documentation

## 2019-09-13 DIAGNOSIS — Z79899 Other long term (current) drug therapy: Secondary | ICD-10-CM | POA: Diagnosis not present

## 2019-09-13 DIAGNOSIS — J449 Chronic obstructive pulmonary disease, unspecified: Secondary | ICD-10-CM | POA: Diagnosis not present

## 2019-09-13 LAB — SEDIMENTATION RATE: Sed Rate: 3 mm/hr (ref 0–20)

## 2019-09-13 NOTE — Telephone Encounter (Signed)
Noted  

## 2019-09-13 NOTE — ED Triage Notes (Signed)
Pt to ED for headache that started yesterday, denies hx of headaches.  States it is located above left eye towards back of head. Denies injury to head. Denies vision changes, balance difficulties, light sensitivity.  Reports taking tylenol this morning with no improvement States nose bleed this morning, states has hx of nosebleeds, bleeding controlled at this time.  States takes blood thinner eliquis.

## 2019-09-13 NOTE — Telephone Encounter (Signed)
Agree with evaluation at ER.

## 2019-09-13 NOTE — Telephone Encounter (Signed)
[place] [facility] - Client TELEPHONE ADVICE RECORD Access[facility] Patient Name: Gabriel Brewer Gender: Male DOB: 12/11/1940 Age: 79 Y 16 M 9 D Return Phone Number: [phone] (Secondary) Address: City/State/Zip: [place] [address] Client [facility] [facility] - Client Client Site [place] Physician Tor Netters- NP Contact Type Call Who Is Calling Patient / Member / Family / Caregiver Call Type Triage / Clinical Caller Name [name] Relationship To Patient Spouse Return Phone Number [phone] (Secondary) Chief Complaint Headache Reason for Call Symptomatic / Request for [place] stated her husband is having a very bad headache. The previous phone number we had for him was incorrect. Translation No Nurse Assessment Nurse: Claiborne Billings, RN, Kim Date/Time (Eastern Time): 09/13/2019 1:30:51 PM Confirm and document reason for call. If symptomatic, describe symptoms. ---Caller states husband has had a headache since yesterday afternoon. No hx of headaches. Current pain level is 9/10. Has the patient had close contact with a person known or suspected to have the novel coronavirus illness OR traveled / lives in area with major community spread (including international travel) in the last 14 days from the onset of symptoms? * If Asymptomatic, screen for exposure and travel within the last 14 days. ---No Does the patient have any new or worsening symptoms? ---Yes Will a triage be completed? ---Yes Related visit to physician within the last 2 weeks? ---No Does the PT have any chronic conditions? (i.e. diabetes, asthma, this includes High risk factors for pregnancy, etc.) ---Yes List chronic conditions. ---Prediabetes, HTN, HLP, GERD, Chronic bronchitis Is this a behavioral health or substance abuse call? ---No Guidelines Guideline Title Affirmed Question Affirmed Notes  Nurse Date/Time (Eastern Time) Headache [1] SEVERE headache (e.g., excruciating) AND [2] "worst headache" of life Suzette Battiest 09/13/2019 1:34:07 PM Disp. Time Eilene Ghazi Time) Disposition Final User PLEASE NOTE: All timestamps contained within this report are represented as [place] Standard Time. CONFIDENTIALTY NOTICE: This fax transmission is intended only for the addressee. It contains information that is legally privileged, confidential or otherwise protected from use or disclosure. If you are not the intended recipient, you are strictly prohibited from reviewing, disclosing, copying using or disseminating any of this information or taking any action in reliance on or regarding this information. If you have received this fax in error, please notify us immediately by telephone so that we can arrange for its return to [place]. Phone: [phone], Toll-Free: [phone], Fax: [phone] Page: 2 of 2 Call Id: [id number] 09/13/2019 1:35:56 PM Go to ED Now (or PCP triage) Yes Claiborne Billings, RN, Max Sane Disagree/Comply Comply Caller Understands Yes PreDisposition Did not know what to do Care Advice Given Per Guideline GO TO ED NOW (OR PCP TRIAGE): * IF NO PCP (PRIMARY CARE PROVIDER) SECOND-LEVEL TRIAGE: You need to be seen within the next hour. Go to the [place] at _____________ [place] as soon as you can. PAIN MEDICINES: * ACETAMINOPHEN - REGULAR STRENGTH TYLENOL: Take 650 mg (two 325 mg pills) by mouth every 4 to 6 hours as needed. Each Regular Strength Tylenol pill has 325 mg of acetaminophen. The most you should take each day is 3,250 mg (10 pills a day). * ACETAMINOPHEN - EXTRA STRENGTH TYLENOL: Take 1,000 mg (two 500 mg pills) every 8 hours as needed. Each Extra Strength Tylenol pill has 500 mg of acetaminophen. The most you should take each day is 3,000 mg (6 pills a day). ANOTHER ADULT SHOULD DRIVE: * It  is better and safer if another adult drives instead of you. CARE ADVICE given per  Headache (Adult) guideline. Referrals GO TO FACILITY UNDECIDED

## 2019-09-13 NOTE — Telephone Encounter (Signed)
[place] [facility] - Client TELEPHONE ADVICE RECORD [facility] Patient Name: Gabriel Brewer Gender: Male DOB: May 17, 1940 Age: 79 Y 67 M 33 D Return Phone Number: [phone] (Primary), [phone] (Secondary) Address: City/State/Zip: [place] [address] Client [facility] [place] [facility] - Client Client Site [place] - Day Physician Tor Netters- NP Contact Type Call Who Is Calling Patient / Member / Family / Caregiver Call Type Triage / Clinical Relationship To Patient Self Return Phone Number [phone] (Secondary) Chief Complaint Headache Reason for Call Symptomatic / Request for [place] states he has a headache. Translation No Disp. Time Eilene Ghazi Time) Disposition Final User 09/13/2019 10:25:01 AM Attempt made - no message left Suzette Battiest 09/13/2019 10:57:36 AM Attempt made - message left Suzette Battiest 09/13/2019 11:00:26 AM Send To RN Personal Claiborne Billings, RN, Kim 09/13/2019 11:19:55 AM FINAL ATTEMPT MADE - message left Yes Lovelace, RN, Amy Comments User: Suezanne Jacquet, RN Date/Time (Eastern Time): 09/13/2019 10:25:10 AM Attempted to reach caller at return phone number [phone] and receive message "your call cannot be completed as dialed, please check the number". Nurse contacted lead PC S. Smith to verify number. User: Dalia Heading Date/Time Eilene Ghazi Time): 09/13/2019 10:53:15 AM Callback number correct as provided. [place] office says the call was from his wife Joycelyn Schmid on [phone] User: Suezanne Jacquet, RN Date/Time Eilene Ghazi Time): 09/13/2019 10:55:45 AM Per PC, secondary phone # is [phone]

## 2019-09-13 NOTE — Telephone Encounter (Signed)
I spoke with pt; pt has H/A with pain level of 9 that starts rt eye and goes to back of head. H/A started on 09/12/19. No dizziness, no vision changes, no CP or SOB pt has COPD and coughs all the time; no worse than usual. No other covid symptoms, no travel or known exposure to covid. Pt has had covid vaccines. Pt said he was just leaving to go to [facility] ED because the H/A is so bad. No available appts at [facility]. Pt was seen for med annual exam on 09/01/19 at [facility]. Pt said he sees them about twice a year but [name] is closer to him and considers her his PCP. FYI to [name].

## 2019-09-13 NOTE — Telephone Encounter (Signed)
Per chart review tab pt is already at  [facility] ED. 

## 2019-09-14 ENCOUNTER — Emergency Department: Payer: Medicare Other

## 2019-09-14 ENCOUNTER — Emergency Department
Admission: EM | Admit: 2019-09-14 | Discharge: 2019-09-14 | Disposition: A | Discharge status: Home / Self Care | Payer: Medicare Other | Attending: Emergency Medicine | Admitting: Emergency Medicine

## 2019-09-14 DIAGNOSIS — R519 Headache, unspecified: Secondary | ICD-10-CM | POA: Diagnosis not present

## 2019-09-14 LAB — BASIC METABOLIC PANEL
Anion gap: 11 (ref 5–15)
BUN: 25 mg/dL — ABNORMAL HIGH (ref 8–23)
CO2: 21 mmol/L — ABNORMAL LOW (ref 22–32)
Calcium: 9 mg/dL (ref 8.9–10.3)
Chloride: 105 mmol/L (ref 98–111)
Creatinine, Ser: 1.12 mg/dL (ref 0.61–1.24)
GFR calc Af Amer: >60 mL/min (ref >60)
GFR calc non Af Amer: >60 mL/min (ref >60)
Glucose, Bld: 114 mg/dL — ABNORMAL HIGH (ref 70–99)
Potassium: 4.2 mmol/L (ref 3.5–5.1)
Sodium: 137 mmol/L (ref 135–145)

## 2019-09-14 LAB — CBC WITH DIFFERENTIAL/PLATELET
Abs Immature Granulocytes: 0.05 10*3/uL (ref 0.00–0.07)
Basophils Absolute: 0 10*3/uL (ref 0.0–0.1)
Basophils Relative: 1 %
Eosinophils Absolute: 0 10*3/uL (ref 0.0–0.5)
Eosinophils Relative: 1 %
HCT: 42.4 % (ref 39.0–52.0)
Hemoglobin: 14.7 g/dL (ref 13.0–17.0)
Immature Granulocytes: 1 %
Lymphocytes Relative: 17 %
Lymphs Abs: 1.4 10*3/uL (ref 0.7–4.0)
MCH: 30.2 pg (ref 26.0–34.0)
MCHC: 34.7 g/dL (ref 30.0–36.0)
MCV: 87.1 fL (ref 80.0–100.0)
Monocytes Absolute: 0.7 10*3/uL (ref 0.1–1.0)
Monocytes Relative: 8 %
Neutro Abs: 6.2 10*3/uL (ref 1.7–7.7)
Neutrophils Relative %: 72 %
Platelets: 169 10*3/uL (ref 150–400)
RBC: 4.87 MIL/uL (ref 4.22–5.81)
RDW: 13.2 % (ref 11.5–15.5)
WBC: 8.4 10*3/uL (ref 4.0–10.5)
nRBC: 0 % (ref 0.0–0.2)

## 2019-09-14 MED ORDER — SODIUM CHLORIDE 0.9 % IV BOLUS
500.0000 mL | Freq: Once | INTRAVENOUS | Status: AC
  Administered 2019-09-14: 500 mL via INTRAVENOUS

## 2019-09-14 MED ORDER — DIPHENHYDRAMINE HCL 50 MG/ML IJ SOLN
12.5000 mg | INTRAMUSCULAR | Status: AC
  Administered 2019-09-14: 12.5 mg via INTRAVENOUS
  Filled 2019-09-14: qty 1

## 2019-09-14 MED ORDER — PROCHLORPERAZINE EDISYLATE 10 MG/2ML IJ SOLN
5.0000 mg | INTRAMUSCULAR | Status: AC
  Administered 2019-09-14: 5 mg via INTRAVENOUS
  Filled 2019-09-14: qty 2

## 2019-09-14 NOTE — ED Notes (Signed)
Pt given water at this time 

## 2019-09-14 NOTE — Discharge Instructions (Signed)
You have been seen in the Emergency Department (ED) for a headache.  Please use Tylenol or Motrin as needed for symptoms, but only as written on the box.  As we have discussed, please follow up with your primary care doctor as soon as possible regarding today's Emergency Department (ED) visit and your headache symptoms.  You can let your doctor know that we perform both a CT of your head and an MRI of your brain and MRA of your head, none of which showed an emergent or acute cause for your symptoms.  Your lab work tonight was also within normal limits.  Call your doctor or return to the ED if you have a worsening headache, sudden and severe headache, confusion, slurred speech, facial droop, weakness or numbness in any arm or leg, extreme fatigue, vision problems, or other symptoms that concern you.

## 2019-09-14 NOTE — ED Provider Notes (Addendum)
[facility] Emergency Department Provider Note  ____________________________________________   First MD Initiated Contact with Patient 09/14/19 [phone]     (approximate)  I have reviewed the triage vital signs and the nursing notes.   HISTORY  Chief Complaint Headache    HPI Gabriel Brewer is a 79 y.o. male with medical history as listed below which notably includes A. fib on Xarelto with a prior CVA.  He presents for evaluation of about 2 days of right-sided headache.  He said that it started acutely after he got up day before yesterday but it was very mild.  It increased in intensity over the course of the day and felt sharp and throbbing.  It goes from the front of the right side of his forehead straight through to the back behind his right ear.  Nothing in particular makes it better or worse.  It got better on its own by the end of the day and he was able to go to sleep, but when he woke up this morning it was present again and worse than before.  He denies any visual changes.  He has no neck pain or stiffness.  He denies fever/chills, sore throat, chest pain, shortness of breath, nausea, vomiting, abdominal pain, and dysuria.  He does not typically suffer from headaches and has never had a migraine.  He has no numbness nor weakness in his extremities.  He had a brief episode of nosebleed earlier yesterday but that has resolved on its own.  He denies any recent trauma.   The patient reports that he has developed some right-sided earache over the course of the last few hours.        Past Medical History:  Diagnosis Date   Cancer ([place]) 2017   skin    COPD (chronic obstructive pulmonary disease) ([place])    Diabetes mellitus without complication (HCC)    GERD (gastroesophageal reflux disease)    Hyperlipidemia    Hypertension    Mitral regurgitation    PAF (paroxysmal atrial fibrillation) ([place])    [facility]     Patient Active Problem List   Diagnosis Date  Noted   PAF (paroxysmal atrial fibrillation) ([place]) 11/09/2018   Lesion of right native kidney 08/24/2018   Controlled type 2 diabetes mellitus with complication, without long-term current use of insulin ([place]) 08/23/2018   Angiodysplasia of stomach and duodenum with hemorrhage    Melena    GI bleed 08/13/2018   Acute blood loss anemia    Iron deficiency anemia 11/14/2017   Elevated prostate specific antigen (PSA) 11/12/2017   Esophageal reflux 11/12/2017   Family history of malignant neoplasm of prostate 11/12/2017   Low back pain 11/12/2017   Vitamin D deficiency 11/12/2017   Pneumonia 11/03/2016   Endocarditis of mitral valve 09/27/2015   Mitral regurgitation 09/27/2015   Essential hypertension 08/18/2015   Skin cancer of face 02/06/2015   Mixed hyperlipidemia 10/22/2014    Past Surgical History:  Procedure Laterality Date   CHOLECYSTECTOMY N/A 07/30/2018   Procedure: LAPAROSCOPIC CHOLECYSTECTOMY WITH INTRAOPERATIVE CHOLANGIOGRAM;  Surgeon: Greer Pickerel, MD;  Location: [facility];  Service: General;  Laterality: N/A;   COLONOSCOPY WITH PROPOFOL N/A 11/17/2017   Procedure: COLONOSCOPY WITH PROPOFOL;  Surgeon: Jonathon Bellows, MD;  Location: [facility] ENDOSCOPY;  Service: Gastroenterology;  Laterality: N/A;   COLONOSCOPY WITH PROPOFOL N/A 10/08/2018   Procedure: COLONOSCOPY WITH PROPOFOL;  Surgeon: Jonathon Bellows, MD;  Location: [facility] ENDOSCOPY;  Service: Gastroenterology;  Laterality: N/A;   ENTEROSCOPY  N/A 08/14/2018   Procedure: ENTEROSCOPY;  Surgeon: Virgel Manifold, MD;  Location: [facility] ENDOSCOPY;  Service: Endoscopy;  Laterality: N/A;   ESOPHAGOGASTRODUODENOSCOPY (EGD) WITH PROPOFOL N/A 11/17/2017   Procedure: ESOPHAGOGASTRODUODENOSCOPY (EGD) WITH PROPOFOL;  Surgeon: Jonathon Bellows, MD;  Location: [facility] ENDOSCOPY;  Service: Gastroenterology;  Laterality: N/A;   GIVENS CAPSULE STUDY N/A 12/09/2017   Procedure: GIVENS CAPSULE STUDY;  Surgeon: Jonathon Bellows, MD;  Location: [facility] ENDOSCOPY;  Service:  Gastroenterology;  Laterality: N/A;   TONSILLECTOMY      Prior to Admission medications   Medication Sig Start Date End Date Taking? Authorizing Provider  apixaban (ELIQUIS) 5 MG TABS tablet Take 1 tablet by mouth 2 (two) times daily. 09/02/18 05/25/19  [provider]  atorvastatin (LIPITOR) 10 MG tablet TAKE 1 TABLET(10 MG) BY MOUTH DAILY 08/26/19   Elby Beck, FNP  Cholecalciferol 4000 units CAPS Take 4,000 Units by mouth every Friday.     [provider]  diltiazem (CARDIZEM CD) 180 MG 24 hr capsule Take 1 capsule (180 mg total) by mouth daily. 05/04/18   Elby Beck, FNP  fluticasone (FLONASE) 50 MCG/ACT nasal spray Place 1 spray into both nostrils daily as needed for allergies.  08/15/17   [provider]  hydrochlorothiazide (HYDRODIURIL) 12.5 MG tablet Take 12.5 mg by mouth daily.  04/21/18   [provider]  omeprazole (PRILOSEC) 40 MG capsule Take 1 capsule (40 mg total) by mouth daily. 05/04/18   Elby Beck, FNP    Allergies Patient has no known allergies.  Family History  Problem Relation Age of Onset   Cerebral aneurysm Mother    Heart disease Father    COPD Father    Breast cancer Sister    Cancer Brother        unknowsn    Social History Social History   Tobacco Use   Smoking status: Former Smoker    Packs/day: 0.50    Years: 20.00    Pack years: 10.00    Types: Cigarettes    Quit date: 11/20/1982    Years since quitting: 36.8   Smokeless tobacco: Never Used  Vaping Use   Vaping Use: Never used  Substance Use Topics   Alcohol use: No   Drug use: No    Review of Systems Constitutional: No fever/chills Eyes: No visual changes. ENT: No sore throat. Cardiovascular: Denies chest pain. Respiratory: Denies shortness of breath. Gastrointestinal: No abdominal pain.  No nausea, no vomiting.  No diarrhea.  No constipation. Genitourinary: Negative for dysuria. Musculoskeletal: Negative for neck pain.  Negative  for back pain. Integumentary: Negative for rash. Neurological: Right-sided headache for the last 2 days as described above.  No focal numbness nor weakness.   ____________________________________________   PHYSICAL EXAM:  VITAL SIGNS: ED Triage Vitals  Enc Vitals Group     BP 09/13/19 1452 (!) 149/81     Pulse Rate 09/13/19 1452 74     Resp 09/13/19 1452 18     Temp 09/13/19 1452 97.7 F (36.5 C)     Temp Source 09/13/19 1452 Oral     SpO2 09/13/19 1452 96 %     Weight 09/13/19 1453 78 kg (172 lb)     Height 09/13/19 1453 1.803 m (5\' 11" )     Head Circumference --      Peak Flow --      Pain Score 09/13/19 1457 8     Pain Loc --      Pain Edu? --  Excl. in [place]? --     Constitutional: Alert and oriented.  Eyes: Left eye has some conjunctival injection.  Pupils are equal and reactive bilaterally.  Extraocular motion is intact. Head: Atraumatic.  The patient has no tenderness to palpation of his temporal arteries.  There is no mastoid tenderness on the right but there was 1 small and very focal area of tenderness behind the mastoid.  No fluctuance nor induration. Ears: Right ear canal and tympanic membrane are normal in appearance. Nose: No congestion/rhinnorhea. Mouth/Throat: Patient is wearing a mask. Neck: No stridor.  No meningeal signs.   Cardiovascular: Normal rate, regular rhythm. Good peripheral circulation. Grossly normal heart sounds. Respiratory: Normal respiratory effort.  No retractions. Gastrointestinal: Soft and nontender. No distention.  Musculoskeletal: No lower extremity tenderness nor edema. No gross deformities of extremities. Neurologic:  Normal speech and language. No gross focal neurologic deficits are appreciated.  Skin:  Skin is warm, dry and intact. Psychiatric: Mood and affect are normal. Speech and behavior are normal.  ____________________________________________   LABS (all labs ordered are listed, but only abnormal results are  displayed)  Labs Reviewed  BASIC METABOLIC PANEL - Abnormal; Notable for the following components:      Result Value   CO2 21 (*)    Glucose, Bld 114 (*)    BUN 25 (*)    All other components within normal limits  SEDIMENTATION RATE  CBC WITH DIFFERENTIAL/PLATELET   ____________________________________________  EKG  No indication for emergent EKG ____________________________________________  RADIOLOGY I, Hinda Kehr, personally viewed and evaluated these images (plain radiographs) as part of my medical decision making, as well as reviewing the written report by the radiologist.  ED MD interpretation: Subtle asymmetric change on the left cerebral hemisphere on noncontrast head CT.  MRI brain and MRI head demonstrate no acute or emergent abnormalities to explain his headache.  Official radiology report(s): CT Head Wo Contrast  Result Date: 09/13/2019 CLINICAL DATA:  Headache, new or worsening. Headache. Additional history provided by technologist: Patient reports headaches since yesterday. EXAM: CT HEAD WITHOUT CONTRAST TECHNIQUE: Contiguous axial images were obtained from the base of the skull through the vertex without intravenous contrast. COMPARISON:  No pertinent prior exams are available for comparison. FINDINGS: Brain: Mild generalized parenchymal atrophy. Minimal ill-defined hypoattenuation within the cerebral white matter is nonspecific, but consistent with chronic small vessel ischemic disease. There is subtle asymmetric extra-axial CSF density prominence overlying the left cerebral hemisphere which could reflect a trace subdural hygroma or chronic subdural hematoma. There is no acute intracranial hemorrhage. No demarcated cortical infarct. No evidence of intracranial mass. No midline shift. Vascular: No hyperdense vessel. Skull: Normal. Negative for fracture or focal lesion. Sinuses/Orbits: Visualized orbits show no acute finding. Partial opacification of bilateral ethmoid air  cells. No significant mastoid effusion. IMPRESSION: No CT evidence of acute intracranial hemorrhage or acute infarct. Subtle asymmetric extra-axial CSF density prominence overlying the left cerebral hemisphere which could reflect a trace subdural hygroma or chronic subdural hematoma. Mild generalized parenchymal atrophy and chronic small vessel ischemic disease. Bilateral ethmoid sinusitis. Electronically Signed   By: Kellie Simmering DO   On: 09/13/2019 16:06   MR ANGIO HEAD WO CONTRAST  Result Date: 09/14/2019 CLINICAL DATA:  Headache with intracranial hemorrhage suspected EXAM: MRI HEAD WITHOUT CONTRAST MRA HEAD WITHOUT CONTRAST TECHNIQUE: Multiplanar, multiecho pulse sequences of the brain and surrounding structures were obtained without intravenous contrast. Angiographic images of the head were obtained using MRA technique without contrast. COMPARISON:  Head CT  from yesterday FINDINGS: MRI HEAD FINDINGS Brain: No acute infarction, hemorrhage, hydrocephalus, or mass lesion. Asymmetric prominence of CSF lateral to the left cerebellar hemisphere could reflect a hygroma, 15 mm in thickness. No significant cerebellar mass effect. Redemonstrated prominence of CSF around the left frontal lobe anteriorly and laterally, but there are regional traversing cortical vessels. Vascular: Normal flow voids Skull and upper cervical spine: Normal marrow signal Sinuses/Orbits: Negative. MRA HEAD FINDINGS Azygos A2 segment.  Right dominant vertebral artery. No branch occlusion, beading, or proximal stenosis. The right PCA appears more extensively filling than the left, but on source images this is less apparent. No visible fistula or arterialization of major venous structures. Negative for aneurysm. IMPRESSION: Brain MRI: 1. No emergent finding. 2. Possible small and noncontributory hygromas. Intracranial MRA: Negative. Electronically Signed   By: Monte Fantasia M.D.   On: 09/14/2019 04:32   MR BRAIN WO CONTRAST  Result Date:  09/14/2019 CLINICAL DATA:  Headache with intracranial hemorrhage suspected EXAM: MRI HEAD WITHOUT CONTRAST MRA HEAD WITHOUT CONTRAST TECHNIQUE: Multiplanar, multiecho pulse sequences of the brain and surrounding structures were obtained without intravenous contrast. Angiographic images of the head were obtained using MRA technique without contrast. COMPARISON:  Head CT from yesterday FINDINGS: MRI HEAD FINDINGS Brain: No acute infarction, hemorrhage, hydrocephalus, or mass lesion. Asymmetric prominence of CSF lateral to the left cerebellar hemisphere could reflect a hygroma, 15 mm in thickness. No significant cerebellar mass effect. Redemonstrated prominence of CSF around the left frontal lobe anteriorly and laterally, but there are regional traversing cortical vessels. Vascular: Normal flow voids Skull and upper cervical spine: Normal marrow signal Sinuses/Orbits: Negative. MRA HEAD FINDINGS Azygos A2 segment.  Right dominant vertebral artery. No branch occlusion, beading, or proximal stenosis. The right PCA appears more extensively filling than the left, but on source images this is less apparent. No visible fistula or arterialization of major venous structures. Negative for aneurysm. IMPRESSION: Brain MRI: 1. No emergent finding. 2. Possible small and noncontributory hygromas. Intracranial MRA: Negative. Electronically Signed   By: Monte Fantasia M.D.   On: 09/14/2019 04:32    ____________________________________________   PROCEDURES   Procedure(s) performed (including Critical Care):  Procedures   ____________________________________________   INITIAL IMPRESSION / MDM / [place] / ED COURSE  As part of my medical decision making, I reviewed the following data within the [place] notes reviewed and incorporated, Labs reviewed , Old chart reviewed and Notes from prior ED visits   Differential diagnosis includes, but is not limited to, intracranial  hemorrhage, meningitis/encephalitis, previous head trauma, temporal arteritis, cavernous venous thrombosis, tension headache, temporal arteritis, migraine or migraine equivalent, idiopathic intracranial hypertension, and non-specific headache.  The patient does not have a history of headaches and this would be an unusual presentation for first-time migraine.  He is not having any nausea or vomiting and no visual changes.  No focal numbness or weakness.  No history of trauma.  No tenderness to palpation of the temporal arteries and his sedimentation rate is normal.  No recent infectious signs or symptoms and the patient reports he is fully vaccinated for COVID-19.  There is a subtle asymmetric change on the head CT and I am concerned about the fact that the patient is on Xarelto, has a new and worsening headache, and has an abnormality on his head CT.  I will obtain MRI brain and MRA head for further assessment; MRI will evaluate for the possibility of both CVA and subtle intracranial  bleed, and MRA will better assess for the possibility of aneurysm which could lead to pain and/or bleeding and could be potentially catastrophic with high morbidity and mortality.  I feel both studies are medically indicated and appropraite in this patient given his age, symptoms, and co-morbidities.  Basic metabolic panel and CBC are also pending.  To treat his symptoms, I will give him a small dose of Compazine 5 mg IV and diphenhydramine 12.5 mg IV (given his age I will use a reduced dose).  I am also giving him a 500 mL normal saline IV bolus.  I discussed the evaluation and treatment plan with the patient and he understands and agrees.      Clinical Course as of Oct 09 1006  Tue Sep 14, 2019  0258 Normal CBC  CBC with Differential/Platelet [CF]  3151 Essentially normal BMP  Basic metabolic panel(!) [CF]  7616 The patient reports some improvement in his headache.  He is awake, alert, ambulatory without any difficulty  and without requiring assistance.  His MRI and MRA do not demonstrate an acute or emergent medical condition to explain his headache.  I went over the results with him and offered additional medication or discharge home for outpatient follow-up and he prefers to go home.  I think this is reasonable and gave my usual and customary management recommendations and return precautions.   [CF]    Clinical Course User Index [CF] Hinda Kehr, MD     ____________________________________________  FINAL CLINICAL IMPRESSION(S) / ED DIAGNOSES  Final diagnoses:  Acute intractable headache, unspecified headache type     MEDICATIONS GIVEN DURING THIS VISIT:  Medications  prochlorperazine (COMPAZINE) injection 5 mg (5 mg Intravenous Given 09/14/19 0251)  diphenhydrAMINE (BENADRYL) injection 12.5 mg (12.5 mg Intravenous Given 09/14/19 0251)  sodium chloride 0.9 % bolus 500 mL (0 mLs Intravenous Stopped 09/14/19 0542)     ED Discharge Orders     None       *Please note:  Gabriel Brewer was evaluated in Emergency Department on 09/14/2019 for the symptoms described in the history of present illness. He was evaluated in the context of the global COVID-19 pandemic, which necessitated consideration that the patient might be at risk for infection with the SARS-CoV-2 virus that causes COVID-19. Institutional protocols and algorithms that pertain to the evaluation of patients at risk for COVID-19 are in a state of rapid change based on information released by regulatory bodies including the CDC and federal and state organizations. These policies and algorithms were followed during the patient's care in the ED.  Some ED evaluations and interventions may be delayed as a result of limited staffing during and after the pandemic.*  Note:  This document was prepared using Dragon voice recognition software and may include unintentional dictation errors.   Hinda Kehr, MD 09/14/19 0737    Hinda Kehr,  MD 10/09/19 1010

## 2019-09-16 ENCOUNTER — Telehealth: Payer: Self-pay | Admitting: Family Medicine

## 2019-09-16 NOTE — Telephone Encounter (Signed)
Patient called. Patient was seen at [facility] ER and told to follow up with Debbie as soon as possible.  I scheduled patient with Debbie tomorrow at 4:00 (the only appt left). Patient was seen for a headache at the ER.  Patient said he doesn't have any other covid symptoms.  Can patient be seen in the office or a virtual visit?

## 2019-09-16 NOTE — Telephone Encounter (Signed)
Patient said he prefers to come in to the office.

## 2019-09-16 NOTE — Telephone Encounter (Signed)
I have reviewed his ER note and I think we can do follow up virtually if he is ok with that. He prefers to come into the office, that is fine.

## 2019-09-17 ENCOUNTER — Ambulatory Visit (INDEPENDENT_AMBULATORY_CARE_PROVIDER_SITE_OTHER): Payer: Medicare Other | Admitting: Family Medicine

## 2019-09-17 ENCOUNTER — Other Ambulatory Visit: Payer: Self-pay

## 2019-09-17 ENCOUNTER — Encounter: Payer: Self-pay | Admitting: Family Medicine

## 2019-09-17 VITALS — BP 110/62 | HR 87 | Temp 97.9°F | Ht 71.0 in | Wt 171.8 lb

## 2019-09-17 DIAGNOSIS — R519 Headache, unspecified: Secondary | ICD-10-CM | POA: Diagnosis not present

## 2019-09-17 NOTE — Progress Notes (Signed)
   Subjective:    Patient ID: Gabriel Brewer, male    DOB: [birth year], 79 y.o.   MRN: [id number]  HPI Chief Complaint  Patient presents with  . Follow-up    seen at [facility] for headache   This is a 79 yo male who presents today for follow up of [facility] er visit. Was seen 09/14/19- had severe headache on right side of head, constant hurt, some relief with lying down. No visual changes. Some tenderness behind right ear. Did not develop any rash. Had negative work up in ED with no cause for symptoms found. Labs including sed rate unremarkable. All symptoms have improved significantly (99% better per patient).    Review of Systems Per HPI, patient denies chest pain, palpitations, shortness of breath, abdominal pain, diarrhea, constipation    Objective:   Physical Exam Physical Exam  Constitutional: Oriented to person, place, and time. Appears well-developed and well-nourished.  HENT:  Head: Normocephalic and atraumatic. No rash, slightly tender behind right ear.  Eyes: Conjunctivae are normal.  Neck: Normal range of motion. Neck supple.  No lymphadenopathy Cardiovascular: Normal rate, regular rhythm and normal heart sounds.   Pulmonary/Chest: Effort normal and breath sounds normal.  Musculoskeletal: No lower extremity edema.   Neurological: Alert and oriented to person, place, and time.  Skin: Skin is warm and dry.  Psychiatric: Normal mood and affect. Behavior is normal. Judgment and thought content normal.  Vitals reviewed.     BP 110/62   Pulse 87   Temp 97.9 F (36.6 C) (Temporal)   Ht 5\' 11"  (1.803 m)   Wt 171 lb 12 oz (77.9 kg)   SpO2 96%   BMI 23.95 kg/m  Wt Readings from Last 3 Encounters:  09/17/19 171 lb 12 oz (77.9 kg)  09/13/19 172 lb (78 kg)  05/25/19 172 lb 12.8 oz (78.4 kg)       Assessment & Plan:  1. New onset headache -Significantly improved.  Reviewed his ER visit, labs, imaging studies with him. -No intervention needed at this time, he was instructed to  follow-up if return of severe pain or new symptoms -Follow-up on file for November.  This visit occurred during the SARS-CoV-2 public health emergency.  Safety protocols were in place, including screening questions prior to the visit, additional usage of staff PPE, and extensive cleaning of exam room while observing appropriate contact time as indicated for disinfecting solutions.      Clarene Reamer, FNP-BC  [facility]  09/17/2019 5:42 PM

## 2019-09-17 NOTE — Patient Instructions (Signed)
Good to see you today!  

## 2019-10-07 DIAGNOSIS — H34811 Central retinal vein occlusion, right eye, with macular edema: Secondary | ICD-10-CM | POA: Diagnosis not present

## 2019-10-07 DIAGNOSIS — H348322 Tributary (branch) retinal vein occlusion, left eye, stable: Secondary | ICD-10-CM | POA: Diagnosis not present

## 2019-11-22 ENCOUNTER — Encounter: Payer: Self-pay | Admitting: Family Medicine

## 2019-11-24 ENCOUNTER — Ambulatory Visit (INDEPENDENT_AMBULATORY_CARE_PROVIDER_SITE_OTHER): Payer: Medicare Other | Admitting: Family Medicine

## 2019-11-24 ENCOUNTER — Encounter: Payer: Self-pay | Admitting: Family Medicine

## 2019-11-24 ENCOUNTER — Other Ambulatory Visit: Payer: Self-pay

## 2019-11-24 VITALS — BP 134/70 | HR 78 | Temp 97.9°F | Ht 71.0 in | Wt 174.0 lb

## 2019-11-24 DIAGNOSIS — Z23 Encounter for immunization: Secondary | ICD-10-CM | POA: Diagnosis not present

## 2019-11-24 DIAGNOSIS — E559 Vitamin D deficiency, unspecified: Secondary | ICD-10-CM

## 2019-11-24 DIAGNOSIS — L989 Disorder of the skin and subcutaneous tissue, unspecified: Secondary | ICD-10-CM

## 2019-11-24 DIAGNOSIS — E118 Type 2 diabetes mellitus with unspecified complications: Secondary | ICD-10-CM | POA: Diagnosis not present

## 2019-11-24 DIAGNOSIS — Z86010 Personal history of colonic polyps: Secondary | ICD-10-CM | POA: Diagnosis not present

## 2019-11-24 DIAGNOSIS — I480 Paroxysmal atrial fibrillation: Secondary | ICD-10-CM

## 2019-11-24 DIAGNOSIS — I10 Essential (primary) hypertension: Secondary | ICD-10-CM

## 2019-11-24 LAB — MICROALBUMIN / CREATININE URINE RATIO
Creatinine,U: 77.4 mg/dL
Microalb Creat Ratio: 0.9 mg/g (ref 0.0–30.0)
Microalb, Ur: <0.7 mg/dL (ref 0.0–1.9)

## 2019-11-24 LAB — HEMOGLOBIN A1C: Hgb A1c MFr Bld: 5.8 % (ref 4.6–6.5)

## 2019-11-24 LAB — LIPID PANEL
Cholesterol: 145 mg/dL (ref 0–200)
HDL: 49.2 mg/dL (ref >39.00)
LDL Cholesterol: 65 mg/dL (ref 0–99)
NonHDL: 95.59
Total CHOL/HDL Ratio: 3
Triglycerides: 151 mg/dL — ABNORMAL HIGH (ref 0.0–149.0)
VLDL: 30.2 mg/dL (ref 0.0–40.0)

## 2019-11-24 LAB — VITAMIN D 25 HYDROXY (VIT D DEFICIENCY, FRACTURES): VITD: 36.13 ng/mL (ref 30.00–100.00)

## 2019-11-24 NOTE — Progress Notes (Signed)
Subjective:    Patient ID: Gabriel Brewer, male    DOB: [birth year], 79 y.o.   MRN: [id number]  HPI  Chief Complaint  Patient presents with  . Follow-up    16month follow up DM, HTN, discuss colonoscopy   This is a 79 yo male who presents today for 6 month follow up of chronic medical conditions.   DM type 2-diet controlled with good control of A1c  HTN-followed by cardiology for PAF, currently on diltiazem 180 daily, benazepril 40 mg daily, hydrochlorothiazide 12.5 mg daily.  He denies any side effects  Headache- has not had any more severe headaches, will have an occasional, short lasting pain in right side of head. No visual changes.   History of adenomatous polyps-he had a colonoscopy last year by [name].  He recommended a 1 year recall.  Patient is not sure if he wants to undergo another screening colonoscopy.  He has noticed some increasing size of a skin lesion on his right temple.  He has been seen previously at [facility] skin center and has an appointment several months away.  Review of Systems Denies visual change, chest pain, shortness of breath, palpitations, abdominal pain, diarrhea/constipation, leg swelling    Objective:   Physical Exam Physical Exam  Constitutional: Oriented to person, place, and time. Appears well-developed and well-nourished.  HENT:  Head: Normocephalic and atraumatic.  Eyes: Conjunctivae are normal.  Neck: Normal range of motion. Neck supple.  Cardiovascular: Normal rate, regular rhythm and normal heart sounds.   Pulmonary/Chest: Effort normal and breath sounds normal.  Musculoskeletal: No lower extremity edema.   Neurological: Alert and oriented to person, place, and time.  Skin: Skin is warm and dry.  Actinic keratosis right temple near her eye.  Approximately 8 mm. Psychiatric: Normal mood and affect. Behavior is normal. Judgment and thought content normal.  Vitals reviewed.    BP 134/70   Pulse 78   Temp 97.9 F (36.6 C) (Temporal)    Ht 5\' 11"  (1.803 m)   Wt 174 lb (78.9 kg)   SpO2 98%   BMI 24.27 kg/m  Wt Readings from Last 3 Encounters:  11/24/19 174 lb (78.9 kg)  09/17/19 171 lb 12 oz (77.9 kg)  09/13/19 172 lb (78 kg)        Assessment & Plan:  1. Essential hypertension -Well-controlled on current medication, denies side effects - Microalbumin / creatinine urine ratio  2. Controlled type 2 diabetes mellitus with complication, without long-term current use of insulin (HCC) - Hemoglobin A1c - Microalbumin / creatinine urine ratio - Lipid Panel  3. Vitamin D deficiency - VITAMIN D 25 Hydroxy (Vit-D Deficiency, Fractures)  4. Hx of adenomatous colonic polyps -Reviewed findings from last colonoscopy and recommendations.  Discussed pros and cons of procedure and encouraged him to follow-up with GI for further discussion.  5. Need for influenza vaccination - Flu Vaccine QUAD High Dose(Fluad)  6. PAF (paroxysmal atrial fibrillation) (HCC) -Continue follow-up with cardiology -Continue Eliquis 5 mg p.o. twice daily  7.  Skin lesion of face -Given proximity to his eye, I will defer treatment to dermatology.  He will call for a sooner appointment with his regular dermatologist and let me know if he needs any help getting in.  -Follow-up in 6 months  This visit occurred during the SARS-CoV-2 public health emergency.  Safety protocols were in place, including screening questions prior to the visit, additional usage of staff PPE, and extensive cleaning of exam room while observing appropriate  contact time as indicated for disinfecting solutions.     Clarene Reamer, FNP-BC  [facility] at [facility]  11/25/2019 3:39 PM

## 2019-11-24 NOTE — Patient Instructions (Signed)
Good to see you today  Please call [facility] and get a sooner appointment for your facial lesion  I will send your lab results through mychart  I recommend you follow up with [name] regarding repeat colonoscopy  Please see me in 6 months

## 2019-11-25 ENCOUNTER — Encounter: Payer: Self-pay | Admitting: Family Medicine

## 2019-11-29 DIAGNOSIS — X32XXXA Exposure to sunlight, initial encounter: Secondary | ICD-10-CM | POA: Diagnosis not present

## 2019-11-29 DIAGNOSIS — L821 Other seborrheic keratosis: Secondary | ICD-10-CM | POA: Diagnosis not present

## 2019-11-29 DIAGNOSIS — L570 Actinic keratosis: Secondary | ICD-10-CM | POA: Diagnosis not present

## 2019-12-02 DIAGNOSIS — H34811 Central retinal vein occlusion, right eye, with macular edema: Secondary | ICD-10-CM | POA: Diagnosis not present

## 2019-12-02 DIAGNOSIS — H348322 Tributary (branch) retinal vein occlusion, left eye, stable: Secondary | ICD-10-CM | POA: Diagnosis not present

## 2019-12-13 DIAGNOSIS — H348322 Tributary (branch) retinal vein occlusion, left eye, stable: Secondary | ICD-10-CM | POA: Diagnosis not present

## 2019-12-13 DIAGNOSIS — H2513 Age-related nuclear cataract, bilateral: Secondary | ICD-10-CM | POA: Diagnosis not present

## 2019-12-13 DIAGNOSIS — H348112 Central retinal vein occlusion, right eye, stable: Secondary | ICD-10-CM | POA: Diagnosis not present

## 2019-12-13 DIAGNOSIS — E113293 Type 2 diabetes mellitus with mild nonproliferative diabetic retinopathy without macular edema, bilateral: Secondary | ICD-10-CM | POA: Diagnosis not present

## 2019-12-13 DIAGNOSIS — H35372 Puckering of macula, left eye: Secondary | ICD-10-CM | POA: Diagnosis not present

## 2019-12-13 LAB — HM DIABETES EYE EXAM

## 2019-12-29 DIAGNOSIS — I480 Paroxysmal atrial fibrillation: Secondary | ICD-10-CM | POA: Diagnosis not present

## 2019-12-29 DIAGNOSIS — I340 Nonrheumatic mitral (valve) insufficiency: Secondary | ICD-10-CM | POA: Diagnosis not present

## 2019-12-29 DIAGNOSIS — I10 Essential (primary) hypertension: Secondary | ICD-10-CM | POA: Diagnosis not present

## 2020-03-18 IMAGING — US US ABDOMEN COMPLETE
1 series · 13 of 25 positions shown · non-contrast
Comparison: Chest CTA 11/05/2016.

CLINICAL DATA: 78-year-old male with 4 days of right upper quadrant
abdominal pain.

EXAM:
ABDOMEN ULTRASOUND COMPLETE

[Series 1: us abdomen complete · 0.22mm/px · 13 of 99 slices shown]
[im 1/99]
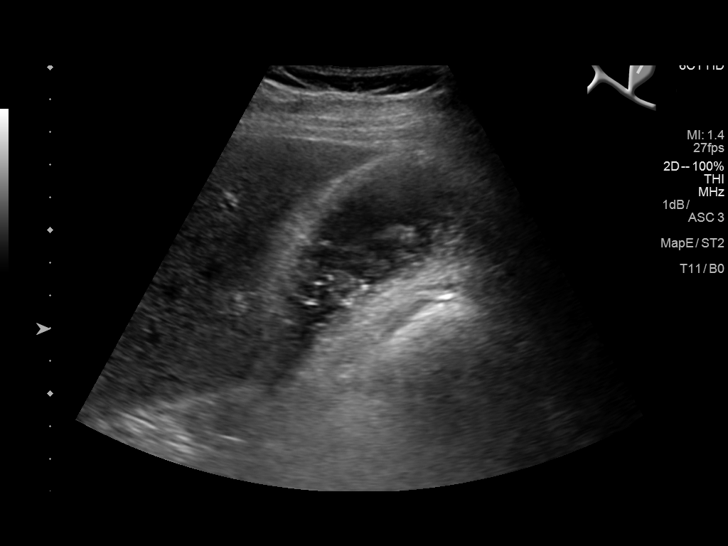
[im 9/99]
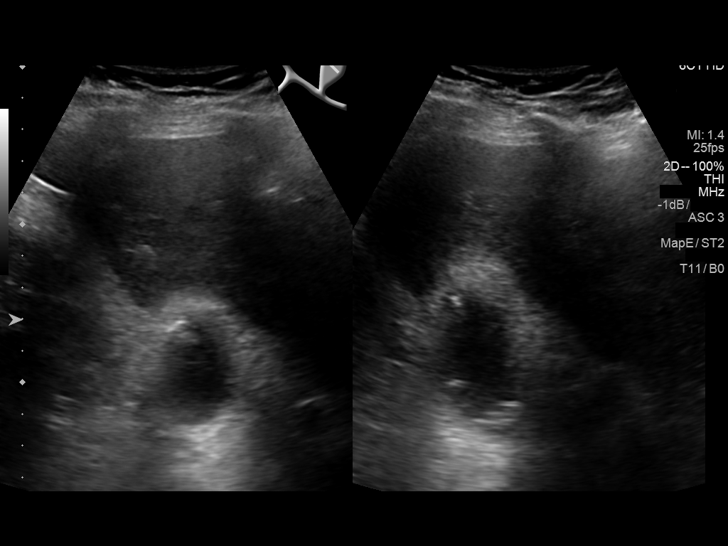
[im 17/99]
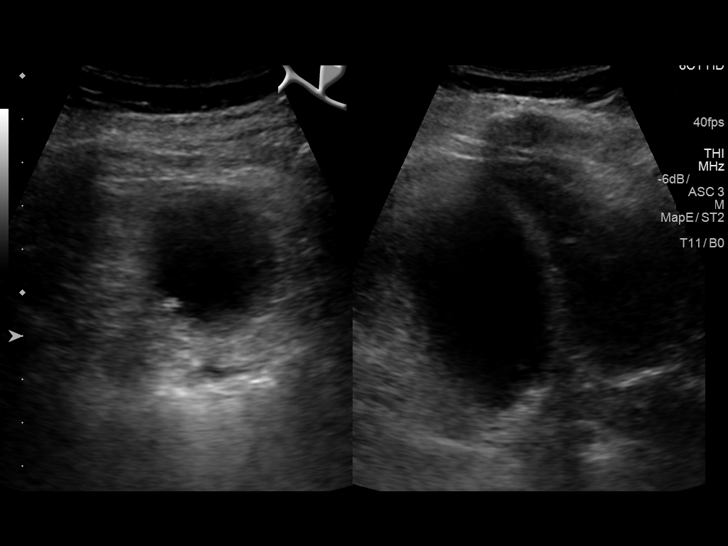
[im 25/99]
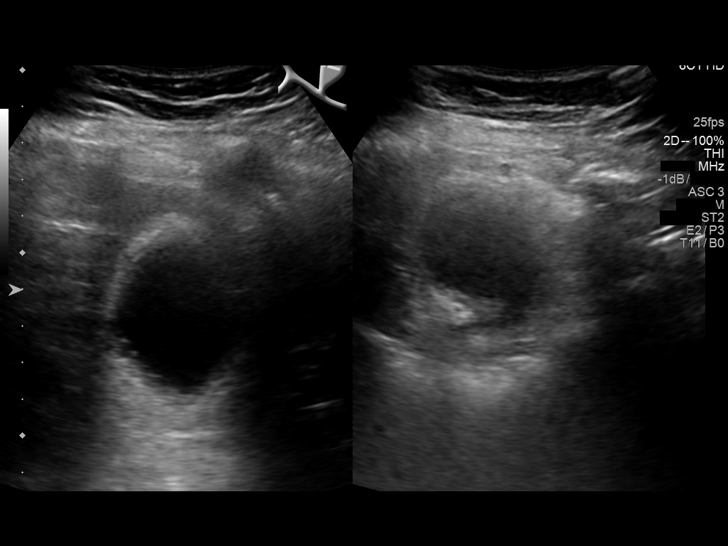
[im 33/99]
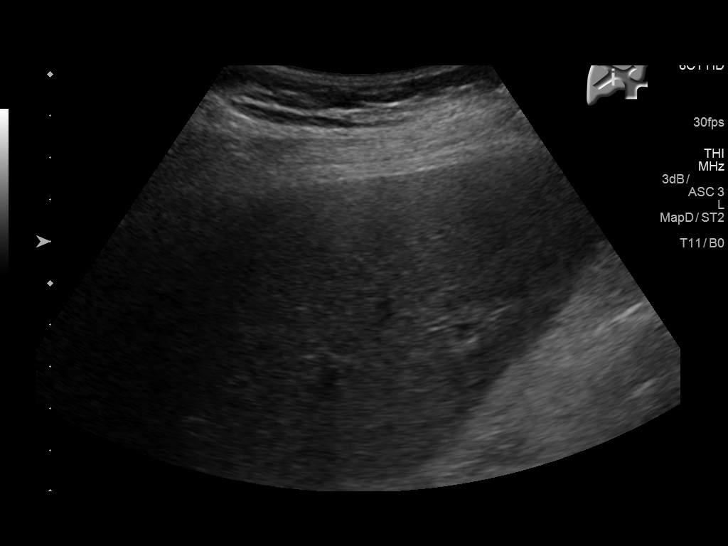
[im 41/99]
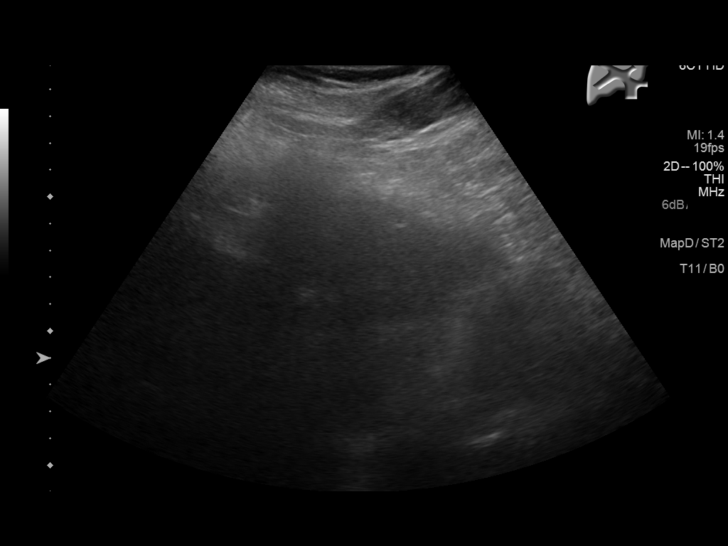
[im 50/99]
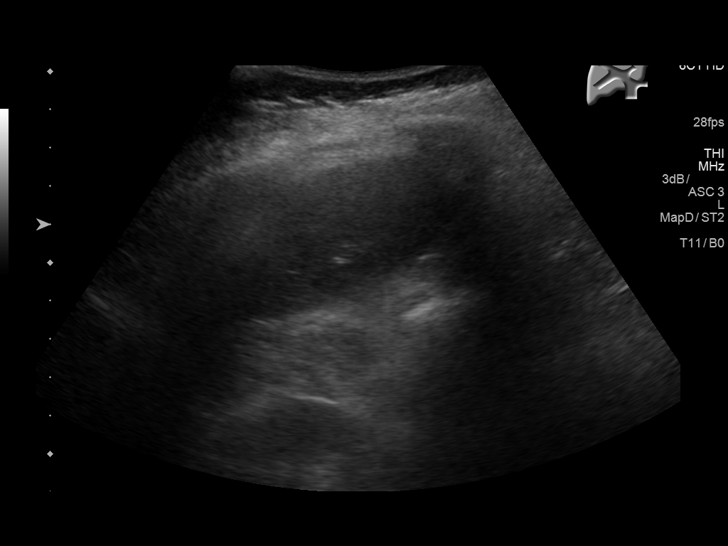
[im 58/99]
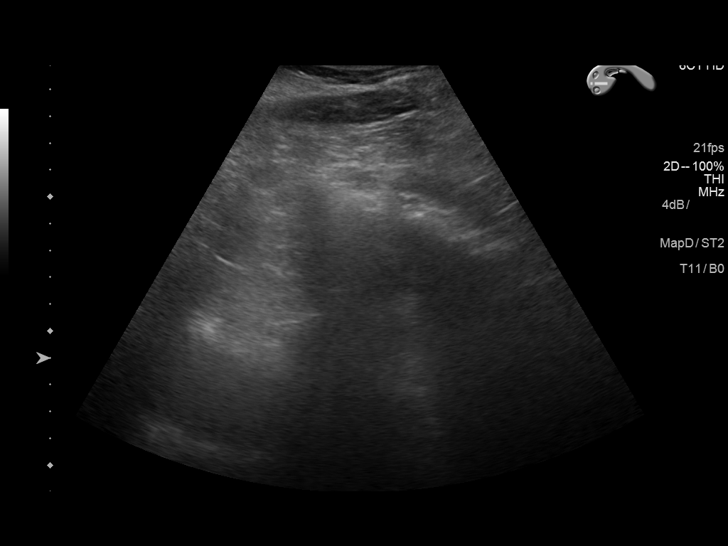
[im 66/99]
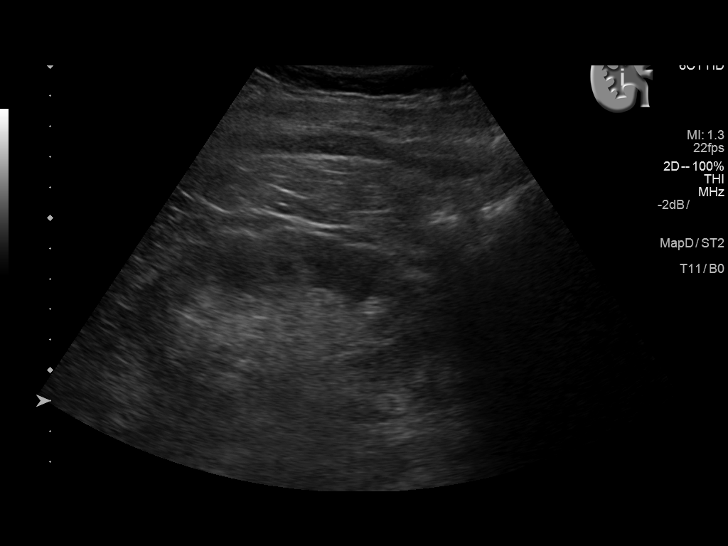
[im 74/99]
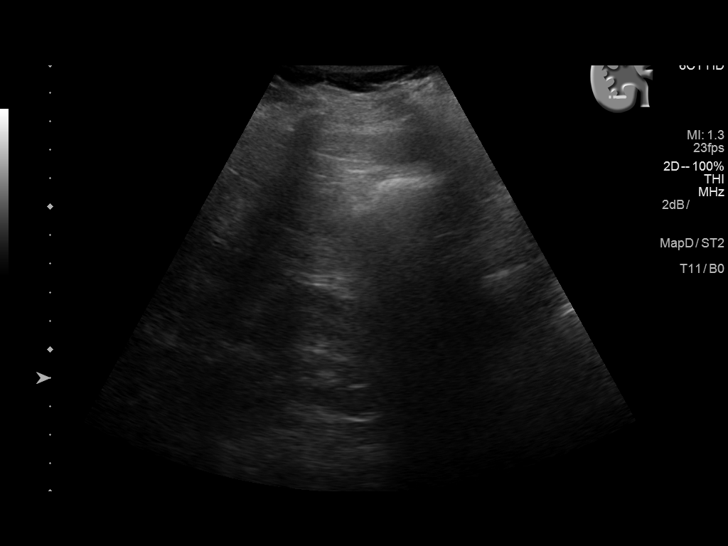
[im 82/99]
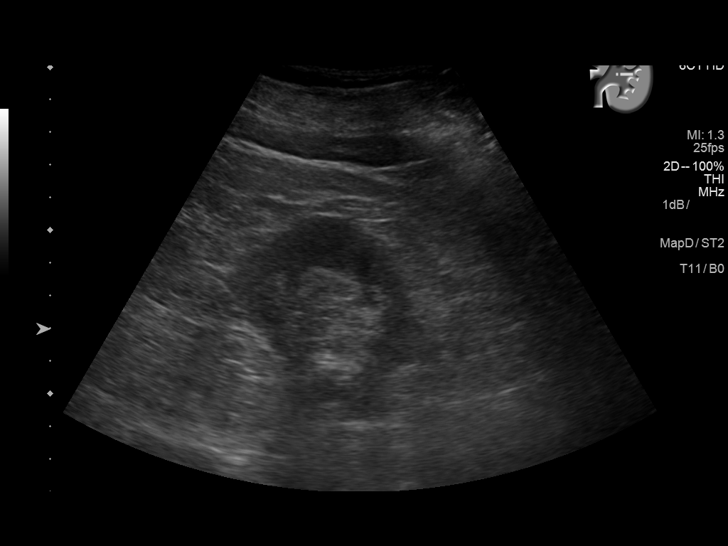
[im 90/99]
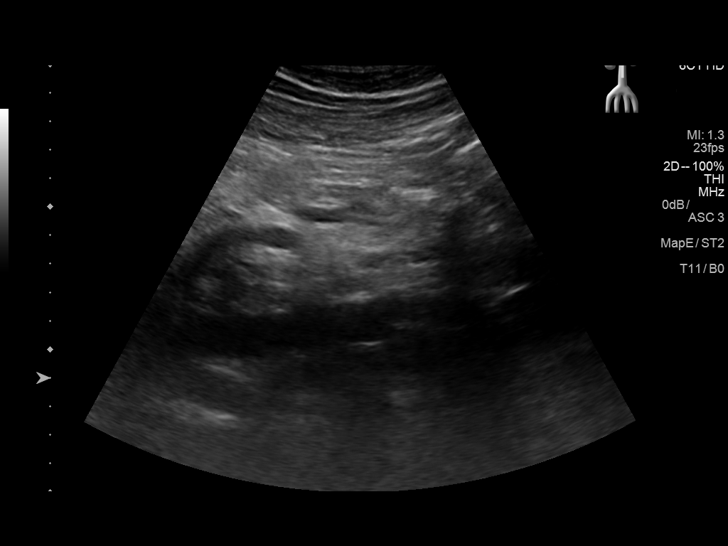
[im 99/99]
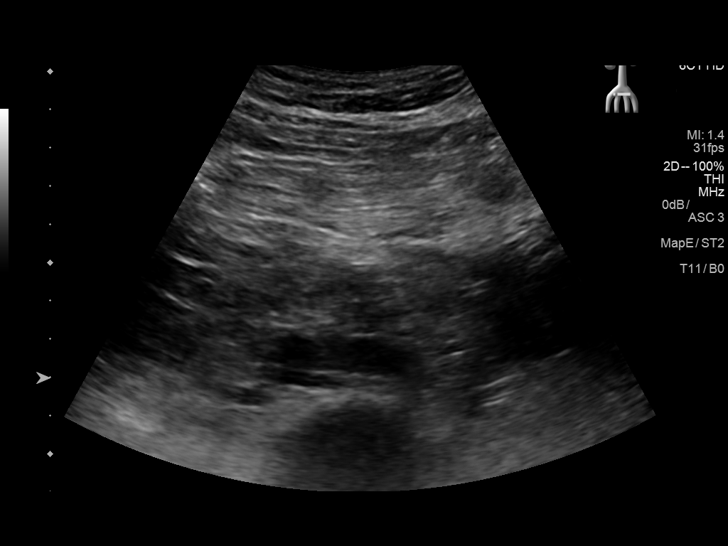

[13 of 25 positions shown; findings below may reference images not displayed]

FINDINGS: Gallbladder: Positive for cholelithiasis. Small dependent stones
individually estimated at 6 millimeters. Possible stones elsewhere
it here into the gallbladder wall (fundus image 22). Abnormal
gallbladder wall thickening of 7-8 millimeters. However, no
sonographic Murphy sign elicited. No pericholecystic fluid.

Common bile duct: Diameter: 3-4 millimeters, normal.

Liver: No focal lesion identified. Within normal limits in
parenchymal echogenicity. No intrahepatic biliary ductal dilatation.
Portal vein is patent on color Doppler imaging with normal direction
of blood flow towards the liver.

IVC: No abnormality visualized.

Pancreas: Obscured by bowel gas.

Spleen: Size and appearance within normal limits.

Right Kidney: Length: 10.0 centimeters. Echogenicity within normal
limits. No mass or hydronephrosis visualized.

Left Kidney: Length: 10.8 centimeters. Echogenicity within normal
limits. No mass or hydronephrosis visualized.

Abdominal aorta: No aneurysm visualized.

Other findings: None.
IMPRESSION: 1. Abnormal gallbladder with wall thickening and small stones. But
no pericholecystic fluid or sonographic Murphy sign to strongly
suggest Acute Cholecystitis. Consider Chronic or mild Acute on
Chronic Cholecystitis.
2. No evidence of bile duct obstruction.
3. Other visible abdominal viscera are normal.

## 2020-05-12 IMAGING — RF INTRAOPERATIVE CHOLANGIOGRAM
1 series · 4 of 4 positions shown · non-contrast
Comparison: Abdominal ultrasound-03/16/2018

CLINICAL DATA: Intraoperative cholangiogram during laparoscopic
cholecystectomy.

EXAM:
INTRAOPERATIVE CHOLANGIOGRAM
FLUOROSCOPY TIME:  10 seconds (2.34 mGy)

[Series 1: run · 4 of 34 frames shown]
[frame 6/34]
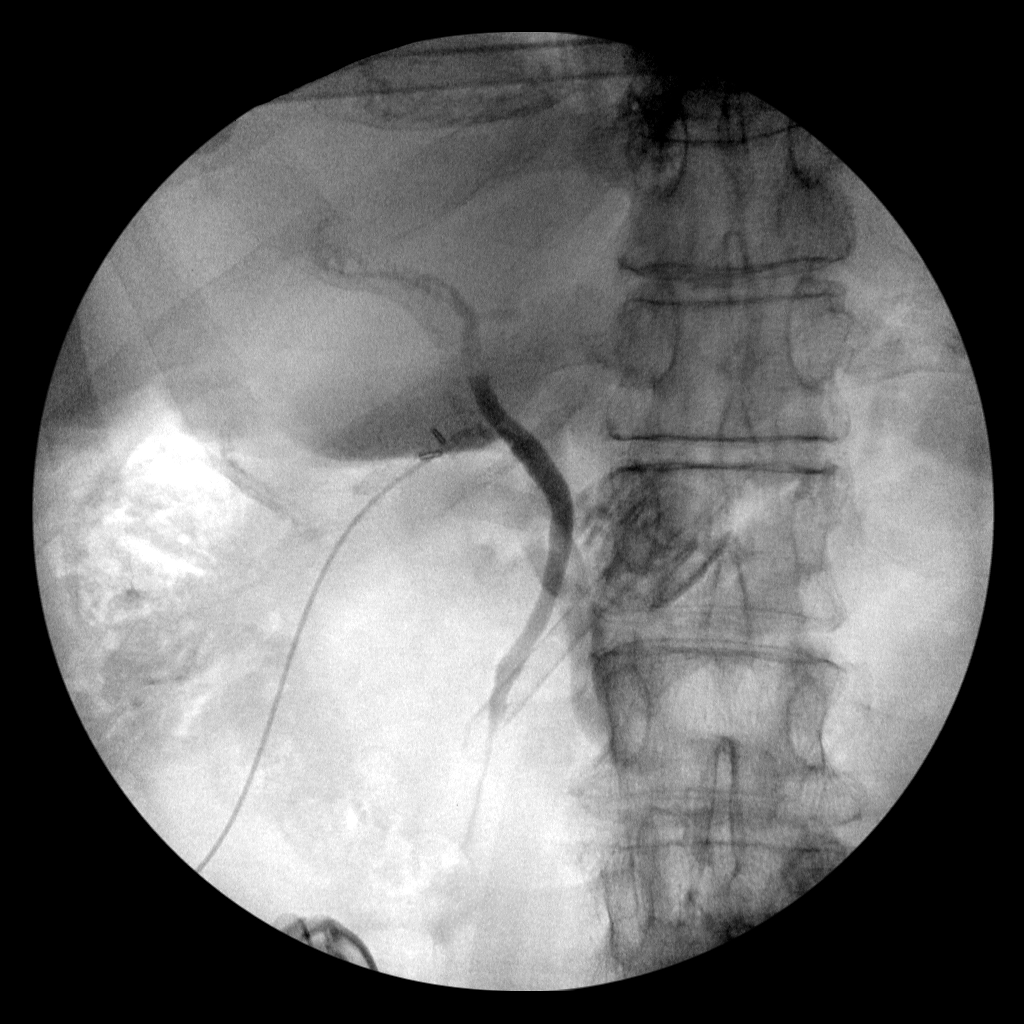
[frame 18/34]
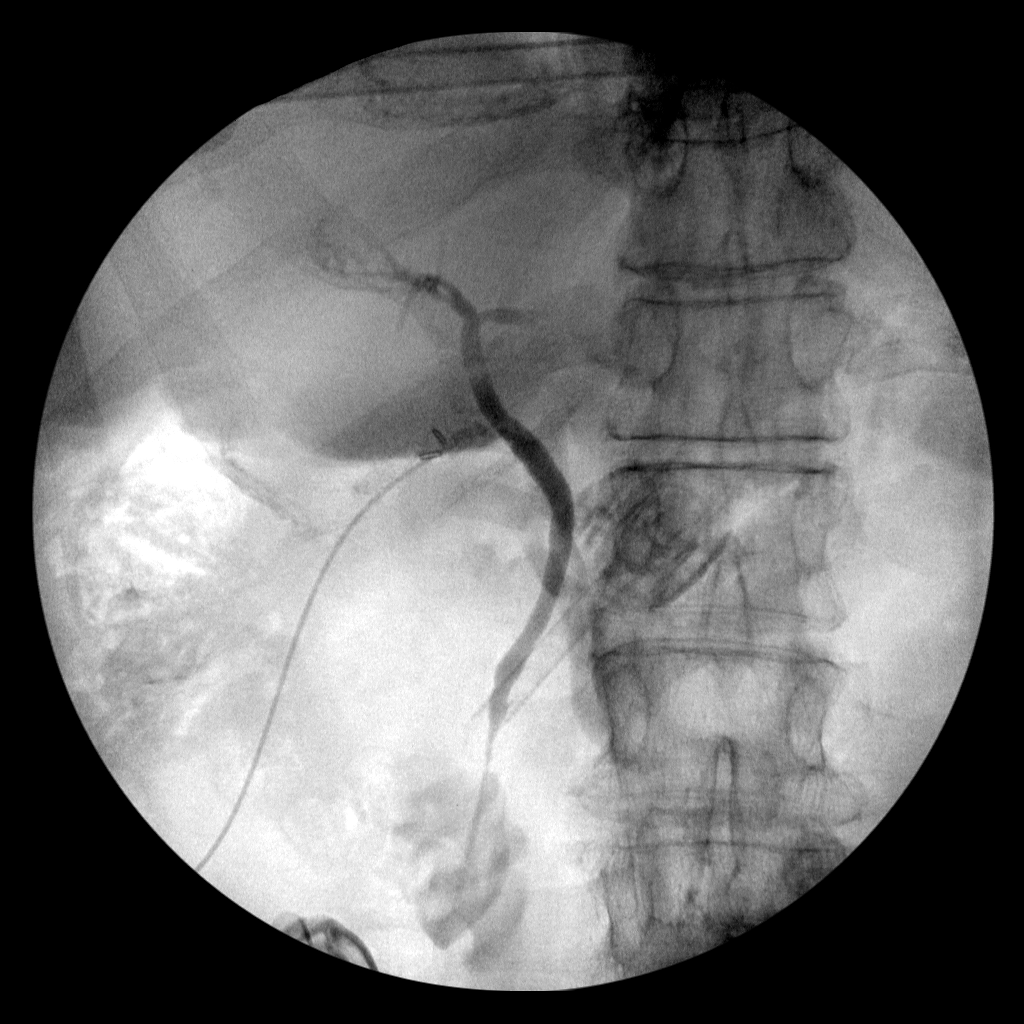
[frame 29/34]
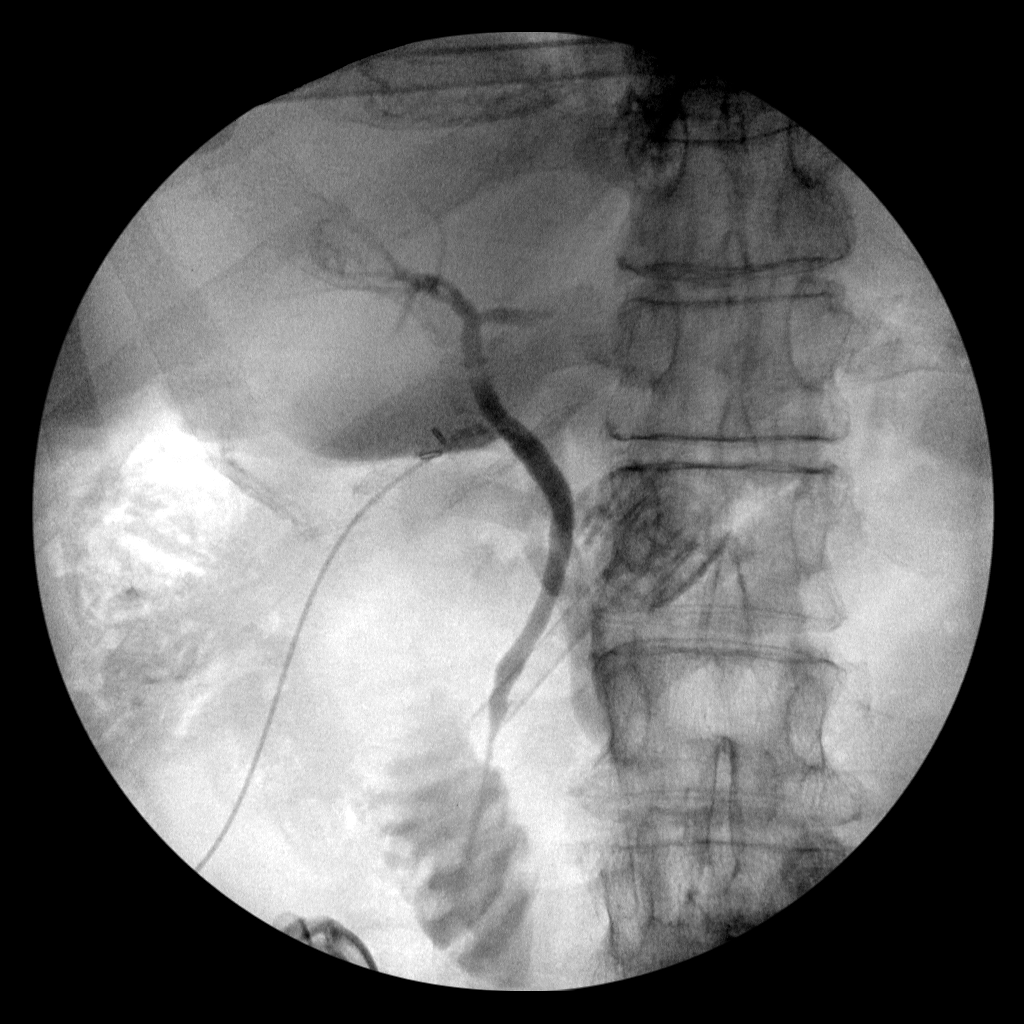
[frame 34/34]
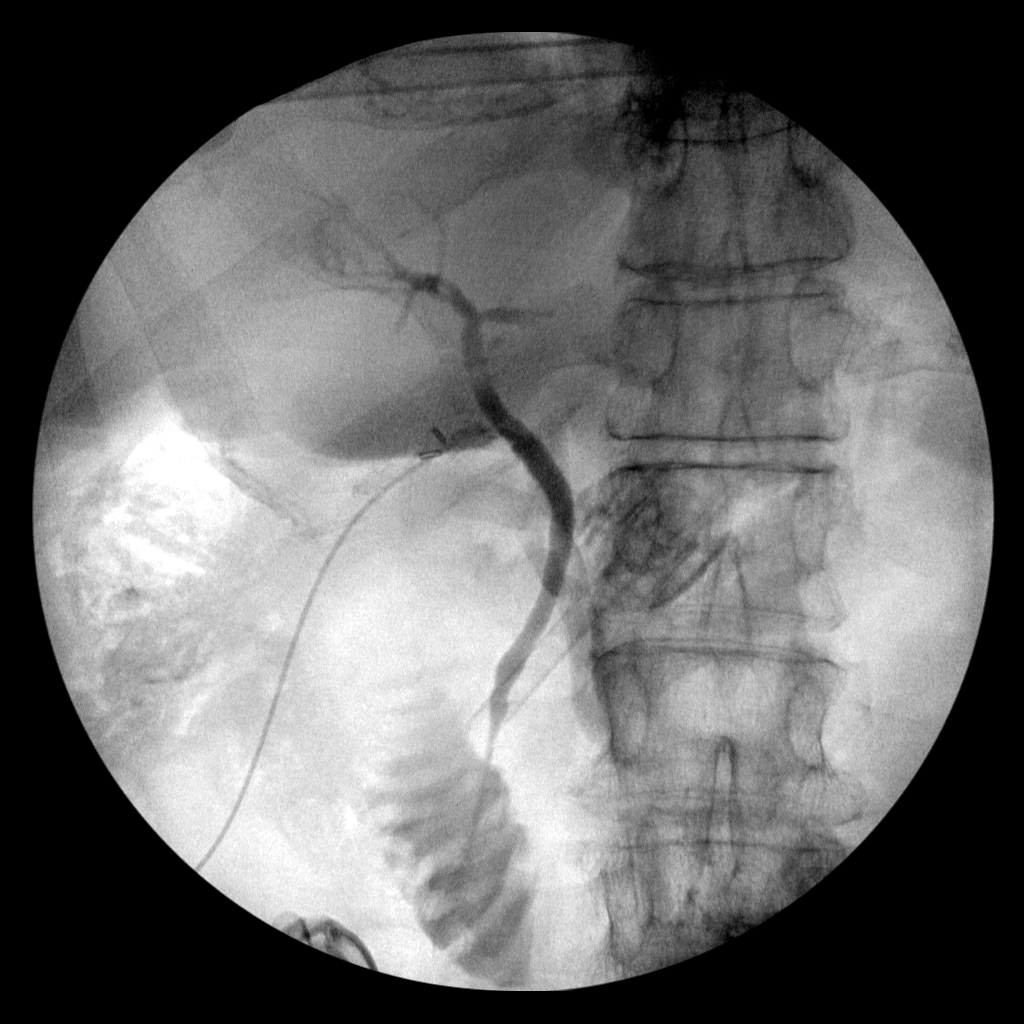

[4 of 4 positions shown; findings below may reference images not displayed]

FINDINGS: Intraoperative cholangiographic images of the right upper abdominal
quadrant during laparoscopic cholecystectomy are provided for
review.

Surgical clips overlie the expected location of the gallbladder
fossa.

Contrast injection demonstrates selective cannulation of the central
aspect of the cystic duct.

There is passage of contrast through the central aspect of the
cystic duct with filling of a non dilated common bile duct. There is
passage of contrast though the CBD and into the descending portion
of the duodenum.

There is minimal reflux of injected contrast into the common hepatic
duct and central aspect of the non dilated intrahepatic biliary
system.

Transient mobile filling defect within the common hepatic duct is
favored to represent an air bubble. There are no discrete filling
defects within the opacified portions of the biliary system to
suggest the presence of choledocholithiasis.
IMPRESSION: No definite evidence of choledocholithiasis.

## 2020-11-03 DIAGNOSIS — Z23 Encounter for immunization: Secondary | ICD-10-CM | POA: Diagnosis not present

## 2021-04-03 ENCOUNTER — Emergency Department
Admission: EM | Admit: 2021-04-03 | Discharge: 2021-04-03 | Disposition: A | Discharge status: Home / Self Care | Payer: Medicare Other | Attending: Emergency Medicine | Admitting: Emergency Medicine

## 2021-04-03 ENCOUNTER — Other Ambulatory Visit: Payer: Self-pay

## 2021-04-03 DIAGNOSIS — Z85828 Personal history of other malignant neoplasm of skin: Secondary | ICD-10-CM | POA: Diagnosis not present

## 2021-04-03 DIAGNOSIS — Z7901 Long term (current) use of anticoagulants: Secondary | ICD-10-CM | POA: Insufficient documentation

## 2021-04-03 DIAGNOSIS — I10 Essential (primary) hypertension: Secondary | ICD-10-CM | POA: Diagnosis not present

## 2021-04-03 DIAGNOSIS — E119 Type 2 diabetes mellitus without complications: Secondary | ICD-10-CM | POA: Diagnosis not present

## 2021-04-03 DIAGNOSIS — R040 Epistaxis: Secondary | ICD-10-CM | POA: Diagnosis present

## 2021-04-03 DIAGNOSIS — I480 Paroxysmal atrial fibrillation: Secondary | ICD-10-CM | POA: Insufficient documentation

## 2021-04-03 DIAGNOSIS — J449 Chronic obstructive pulmonary disease, unspecified: Secondary | ICD-10-CM | POA: Diagnosis not present

## 2021-04-03 LAB — CBC WITH DIFFERENTIAL/PLATELET
Abs Immature Granulocytes: 0.03 10*3/uL (ref 0.00–0.07)
Basophils Absolute: 0.1 10*3/uL (ref 0.0–0.1)
Basophils Relative: 1 %
Eosinophils Absolute: 0.1 10*3/uL (ref 0.0–0.5)
Eosinophils Relative: 2 %
HCT: 38.2 % — ABNORMAL LOW (ref 39.0–52.0)
Hemoglobin: 12.5 g/dL — ABNORMAL LOW (ref 13.0–17.0)
Immature Granulocytes: 0 %
Lymphocytes Relative: 26 %
Lymphs Abs: 1.7 10*3/uL (ref 0.7–4.0)
MCH: 25.8 pg — ABNORMAL LOW (ref 26.0–34.0)
MCHC: 32.7 g/dL (ref 30.0–36.0)
MCV: 78.8 fL — ABNORMAL LOW (ref 80.0–100.0)
Monocytes Absolute: 0.6 10*3/uL (ref 0.1–1.0)
Monocytes Relative: 9 %
Neutro Abs: 4.2 10*3/uL (ref 1.7–7.7)
Neutrophils Relative %: 62 %
Platelets: 216 10*3/uL (ref 150–400)
RBC: 4.85 MIL/uL (ref 4.22–5.81)
RDW: 15.7 % — ABNORMAL HIGH (ref 11.5–15.5)
WBC: 6.8 10*3/uL (ref 4.0–10.5)
nRBC: 0 % (ref 0.0–0.2)

## 2021-04-03 LAB — BASIC METABOLIC PANEL
Anion gap: 7 (ref 5–15)
BUN: 33 mg/dL — ABNORMAL HIGH (ref 8–23)
CO2: 25 mmol/L (ref 22–32)
Calcium: 9.1 mg/dL (ref 8.9–10.3)
Chloride: 105 mmol/L (ref 98–111)
Creatinine, Ser: 1.49 mg/dL — ABNORMAL HIGH (ref 0.61–1.24)
GFR, Estimated: 47 mL/min — ABNORMAL LOW (ref >60)
Glucose, Bld: 154 mg/dL — ABNORMAL HIGH (ref 70–99)
Potassium: 4 mmol/L (ref 3.5–5.1)
Sodium: 137 mmol/L (ref 135–145)

## 2021-04-03 MED ORDER — LIDOCAINE VISCOUS HCL 2 % MT SOLN
15.0000 mL | Freq: Once | OROMUCOSAL | Status: AC
  Administered 2021-04-03: 15 mL via OROMUCOSAL
  Filled 2021-04-03: qty 15

## 2021-04-03 NOTE — ED Provider Notes (Signed)
? ?[facility] ?Provider Note ? ? ? Event Date/Time  ? First MD Initiated Contact with Patient 04/03/21 2023   ?  (approximate) ? ? ?History  ? ?Epistaxis ? ? ?HPI ? ?Gabriel Brewer is a 81 y.o. male past medical history of skin cancer on the nose status post radiation, paroxysmal A-fib on Eliquis, hypertension hyperlipidemia and diabetes who presents with nosebleed.  Patient had 2 nosebleeds today both from the left nare.  Initially started this morning and was short-lived.  Had a longer episode this afternoon and then has had steady bleeding since around 5 PM.  Has had nosebleeds in the past but has never required an ED visit.  He denies other sources of bleeding including bleeding from his gums or blood in his stool.  No shortness of breath ?  ? ?Past Medical History:  ?Diagnosis Date  ? Cancer [facility]) 2017  ? skin   ? COPD (chronic obstructive pulmonary disease) ([place])   ? Diabetes mellitus without complication ([place])   ? GERD (gastroesophageal reflux disease)   ? Hyperlipidemia   ? Hypertension   ? Mitral regurgitation   ? PAF (paroxysmal atrial fibrillation) ([place])   ? Stroke [facility])   ? ? ?Patient Active Problem List  ? Diagnosis Date Noted  ? PAF (paroxysmal atrial fibrillation) ([place]) 11/09/2018  ? Lesion of right native kidney 08/24/2018  ? Controlled type 2 diabetes mellitus with complication, without long-term current use of insulin ([place]) 08/23/2018  ? Angiodysplasia of stomach and duodenum with hemorrhage   ? Melena   ? GI bleed 08/13/2018  ? Acute blood loss anemia   ? Iron deficiency anemia 11/14/2017  ? Elevated prostate specific antigen (PSA) 11/12/2017  ? Esophageal reflux 11/12/2017  ? Family history of malignant neoplasm of prostate 11/12/2017  ? Low back pain 11/12/2017  ? Vitamin D deficiency 11/12/2017  ? Pneumonia 11/03/2016  ? Endocarditis of mitral valve 09/27/2015  ? Mitral regurgitation 09/27/2015  ? Essential hypertension 08/18/2015  ? Skin cancer of face 02/06/2015  ? Mixed  hyperlipidemia 10/22/2014  ? ? ? ?Physical Exam  ?Triage Vital Signs: ?ED Triage Vitals  ?Enc Vitals Group  ?   BP 04/03/21 1933 (!) 144/72  ?   Pulse Rate 04/03/21 1933 91  ?   Resp 04/03/21 1933 16  ?   Temp 04/03/21 1933 98.2 ?F (36.8 ?C)  ?   Temp Source 04/03/21 1933 Oral  ?   SpO2 04/03/21 1933 94 %  ?   Weight 04/03/21 1934 170 lb (77.1 kg)  ?   Height 04/03/21 1934 '5\' 11"'$  (1.803 m)  ?   Head Circumference --   ?   Peak Flow --   ?   Pain Score 04/03/21 1934 0  ?   Pain Loc --   ?   Pain Edu? --   ?   Excl. in [place]? --   ? ? ?Most recent vital signs: ?Vitals:  ? 04/03/21 1933 04/03/21 2311  ?BP: (!) 144/72 (!) 150/74  ?Pulse: 91 72  ?Resp: 16 20  ?Temp: 98.2 ?F (36.8 ?C)   ?SpO2: 94% 93%  ? ? ? ?General: Awake, no distress.  ?CV:  Good peripheral perfusion.  ?Resp:  Normal effort.  ?Abd:  No distention.  ?Neuro:             Awake, Alert, Oriented x 3  ?Other:  Active bleeding from the left nare ? ? ?ED Results / Procedures / Treatments  ?Labs ?(all  labs ordered are listed, but only abnormal results are displayed) ?Labs Reviewed  ?CBC WITH DIFFERENTIAL/PLATELET - Abnormal; Notable for the following components:  ?    Result Value  ? Hemoglobin 12.5 (*)   ? HCT 38.2 (*)   ? MCV 78.8 (*)   ? MCH 25.8 (*)   ? RDW 15.7 (*)   ? All other components within normal limits  ?BASIC METABOLIC PANEL - Abnormal; Notable for the following components:  ? Glucose, Bld 154 (*)   ? BUN 33 (*)   ? Creatinine, Ser 1.49 (*)   ? GFR, Estimated 47 (*)   ? All other components within normal limits  ? ? ? ?EKG ? ? ? ? ?RADIOLOGY ? ? ? ?PROCEDURES: ? ?Critical Care performed: No ? ?.Epistaxis Management ? ?Date/Time: 04/03/2021 11:15 PM ?Performed by: Rada Hay, MD ?Authorized by: Rada Hay, MD  ? ?Consent:  ?  Consent obtained:  Verbal ?  Risks, benefits, and alternatives were discussed: yes   ?  Risks discussed:  Pain and nasal injury ?  Alternatives discussed:  Alternative treatment ?Universal protocol:  ?  Procedure  explained and questions answered to patient or proxy's satisfaction: yes   ?  Relevant documents present and verified: yes   ?  Patient identity confirmed:  Verbally with patient ?Anesthesia:  ?  Anesthesia method:  Topical application ?  Topical anesthetic:  Lidocaine gel ?Procedure details:  ?  Treatment site:  L anterior ?  Treatment method:  Nasal balloon ?  Treatment complexity:  Limited ?  Treatment episode: recurring   ?Post-procedure details:  ?  Assessment:  Bleeding decreased ?  Procedure completion:  Tolerated ? ?The patient is on the cardiac monitor to evaluate for evidence of arrhythmia and/or significant heart rate changes. ? ? ?MEDICATIONS ORDERED IN ED: ?Medications  ?lidocaine (XYLOCAINE) 2 % viscous mouth solution 15 mL (15 mLs Mouth/Throat Given by Other 04/03/21 2058)  ?lidocaine (XYLOCAINE) 2 % viscous mouth solution 15 mL (15 mLs Mouth/Throat Given 04/03/21 2218)  ? ? ? ?IMPRESSION / MDM / ASSESSMENT AND PLAN / ED COURSE  ?I reviewed the triage vital signs and the nursing notes. ?             ?               ? ?Differential diagnosis includes, but is not limited to, anterior versus posterior epistaxis ? ?81 year old male who is anticoagulated on Eliquis for paroxysmal A-fib who presents with bleeding from the left nare.  Has had 2 episodes earlier in the day and this episode not resolving with nasal clamp which she had at home.  On exam there is active bleeding from the left nare.  Discussed with patient using topical Afrin plus compression versus going right to anterior packing.  Given he has had the nasal clamp on for over 30 minutes and is still having this much bleeding I advised that I do expect we will need anterior packing so he preferred to go right to that.  Anterior pack was placed.  Initially he did have some ongoing bleeding in the posterior oropharynx but after the balloon was inflated more this stopped.  Prior to discharge did have 1 episode of scant spitting up blood.  Discussed  with patient the option of putting in a nasal pack on the contralateral nare for greater tamponade effect but he preferred to be discharged because he felt that he had significantly improved.  Ultimately I have low  suspicion for posterior bleed given his unilateral in the posterior oropharynx.  I did advise the patient that if he has more significant bleeding down the posterior oropharynx that this is a sign that the bleeding is not controlled and that he will have to return to the ED.  I also advised him to hold the Eliquis until he can follow-up with ENT.  ?  ? ? ?FINAL CLINICAL IMPRESSION(S) / ED DIAGNOSES  ? ?Final diagnoses:  ?Epistaxis  ? ? ? ?Rx / DC Orders  ? ?ED Discharge Orders   ? ? None  ? ?  ? ? ? ?Note:  This document was prepared using Dragon voice recognition software and may include unintentional dictation errors. ?  ?Rada Hay, MD ?04/03/21 2319 ? ?

## 2021-04-03 NOTE — ED Triage Notes (Signed)
Pt presents to ER c/o his 3rd nosebleed today.  Pt states he had 2 bleeds earlier that he was able to stop quickly, but his most recent nosebleed has been going on since around 1715 today.  Pt states he takes eliquis daily and has had previous radiation to his nasal area in the past.  Pt is otherwise A&O x4 at this time in NAD.  Nose is clamped with a clip at this time.   ?

## 2021-04-03 NOTE — Discharge Instructions (Addendum)
If you have increasing bleeding down your throat, please return to the emergency department as you may need to have another pack placed on the other nostril.  Please hold your Eliquis until you follow-up with ENT.  Please call ENT to schedule an appointment for the next 1 to 2 days.  If you are unable to have the packing removed in about 2 days, please return to the emergency department for reevaluation. ?

## 2021-04-03 NOTE — ED Notes (Signed)
Packing applied to both nostrils by MD at this time ?

## 2021-06-27 IMAGING — MR MR MRA HEAD W/O CM
1 series · 19 of 48 positions shown · non-contrast
Comparison: Head CT from yesterday

CLINICAL DATA: Headache with intracranial hemorrhage suspected

EXAM:
MRI HEAD WITHOUT CONTRAST
MRA HEAD WITHOUT CONTRAST
TECHNIQUE: Multiplanar, multiecho pulse sequences of the brain and surrounding
structures were obtained without intravenous contrast. Angiographic
images of the head were obtained using MRA technique without
contrast.

[Series 3: TOF · axial · 0.5mm · 0.41mm/px · z∈[-43,+61]mm · 19 of 224 slices shown]
[im 1/224]
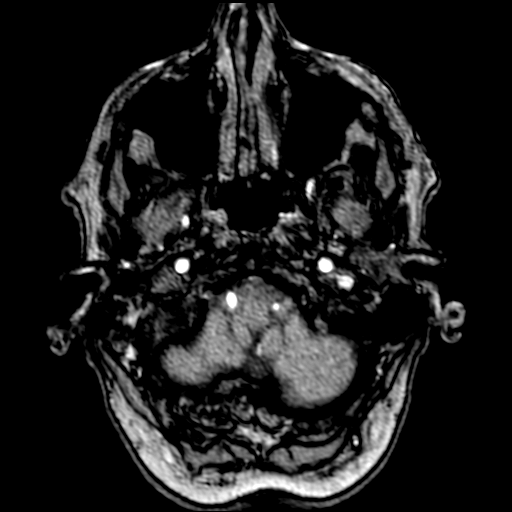
[im 5/224]
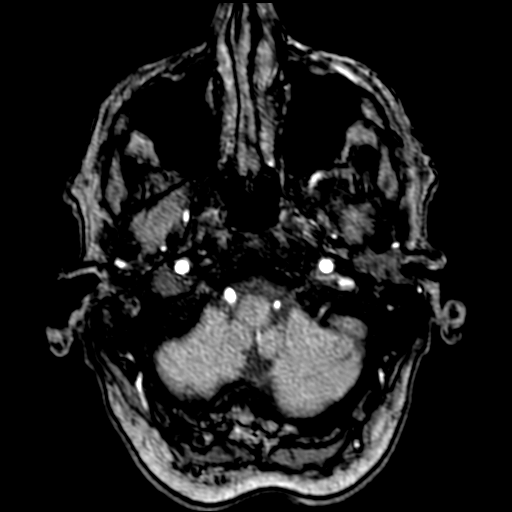
[im 10/224]
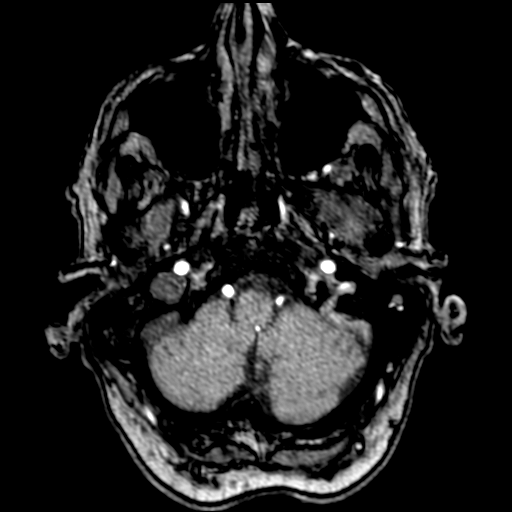
[im 15/224]
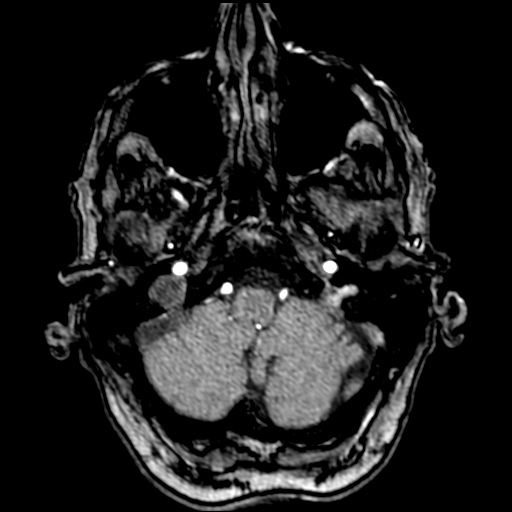
[im 19/224]
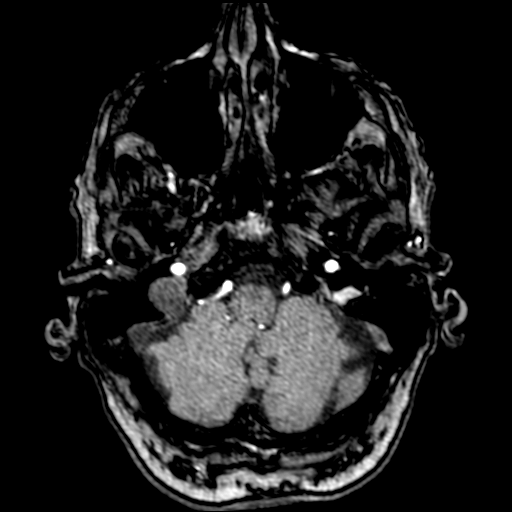
[im 24/224]
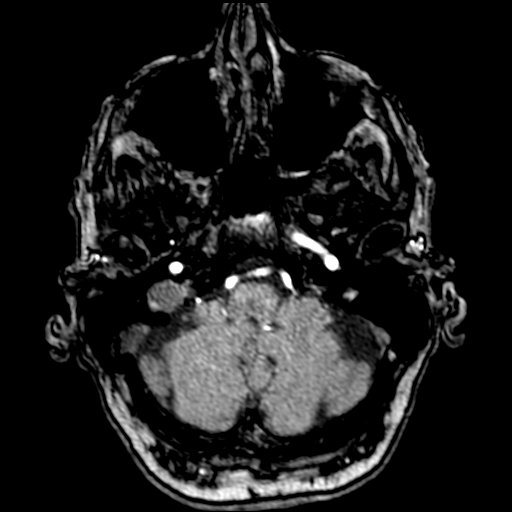
[im 29/224]
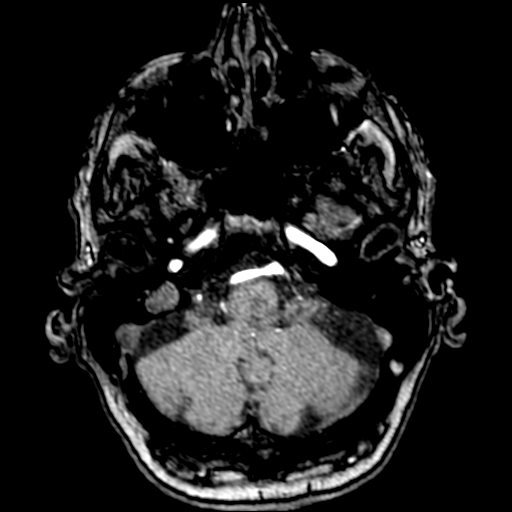
[im 34/224]
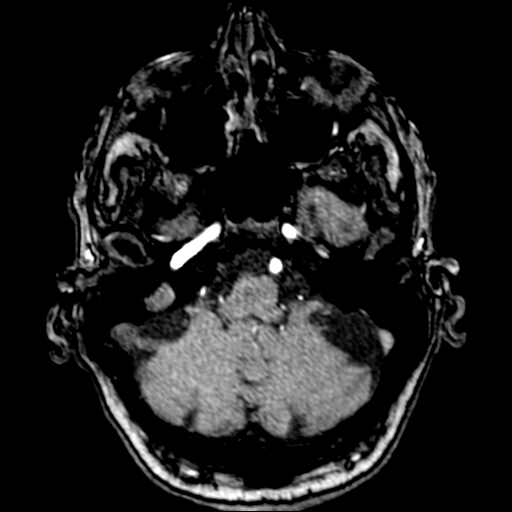
[im 38/224]
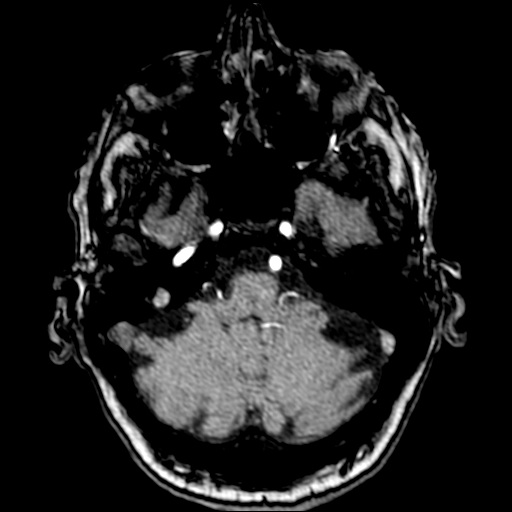
[im 43/224]
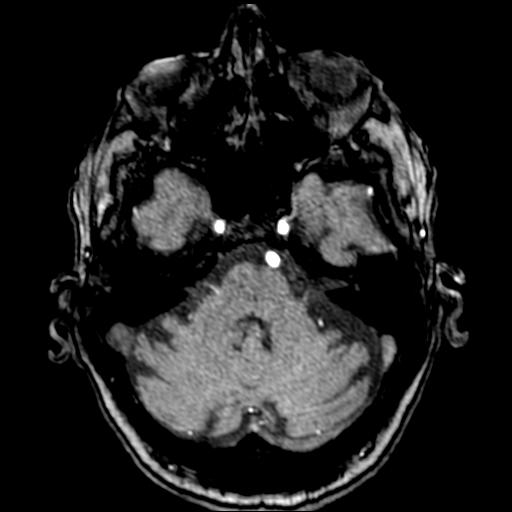
[im 48/224]
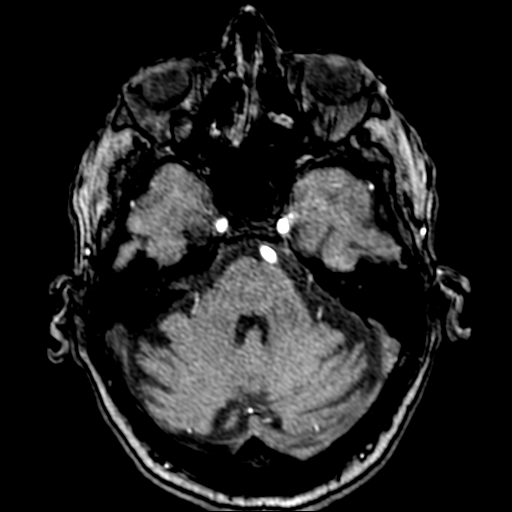
[im 72/224]
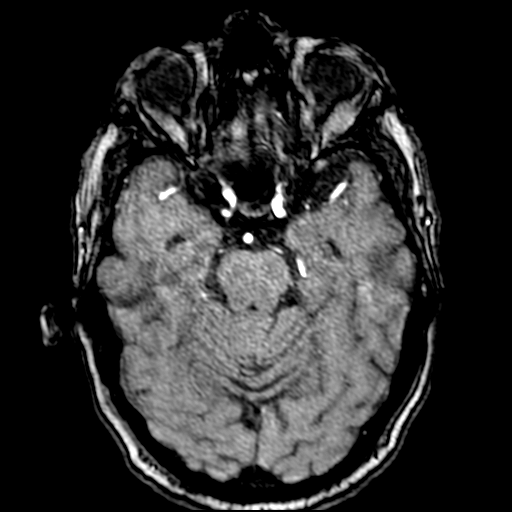
[im 100/224]
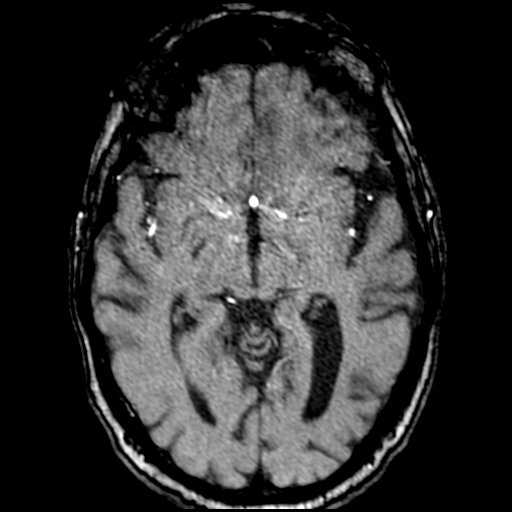
[im 114/224]
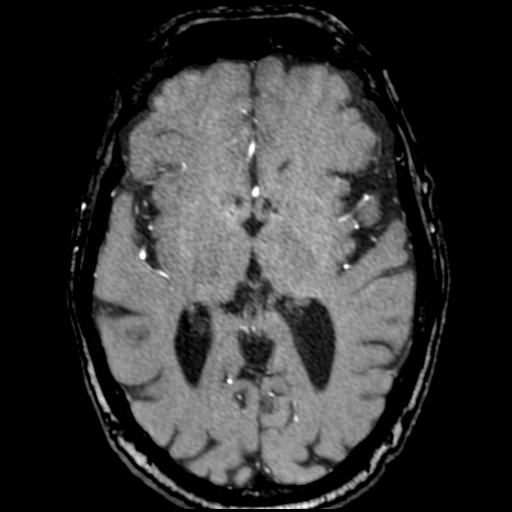
[im 129/224]
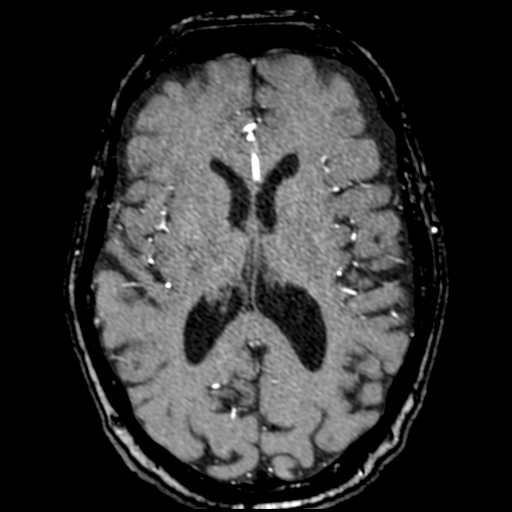
[im 157/224]
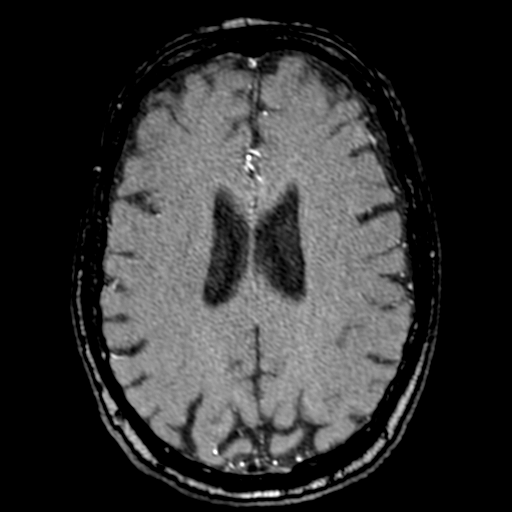
[im 186/224]
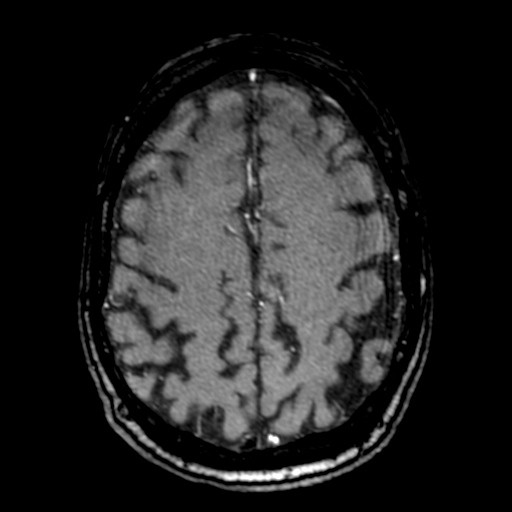
[im 190/224]
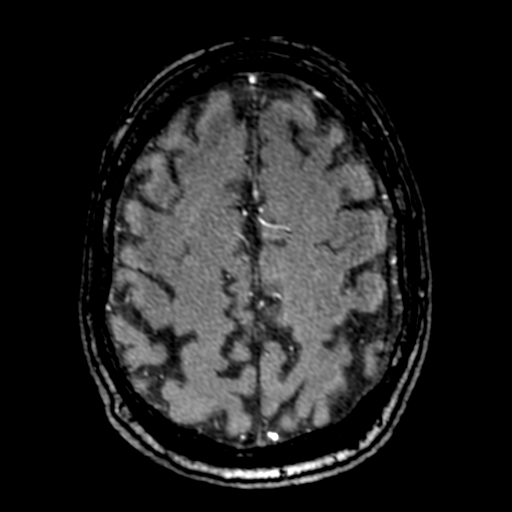
[im 214/224]
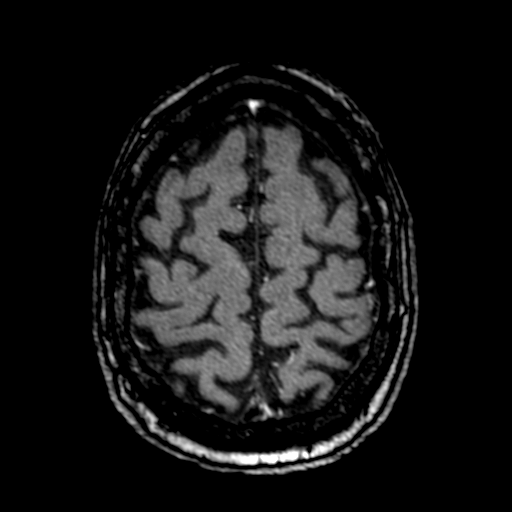

[19 of 48 positions shown; findings below may reference images not displayed]

FINDINGS: MRI HEAD FINDINGS

Brain: No acute infarction, hemorrhage, hydrocephalus, or mass
lesion. Asymmetric prominence of CSF lateral to the left cerebellar
hemisphere could reflect a hygroma, 15 mm in thickness. No
significant cerebellar mass effect. Redemonstrated prominence of CSF
around the left frontal lobe anteriorly and laterally, but there are
regional traversing cortical vessels.

Vascular: Normal flow voids

Skull and upper cervical spine: Normal marrow signal

Sinuses/Orbits: Negative.

MRA HEAD FINDINGS

Azygos A2 segment.  Right dominant vertebral artery.

No branch occlusion, beading, or proximal stenosis. The right PCA
appears more extensively filling than the left, but on source images
this is less apparent. No visible fistula or arterialization of
major venous structures. Negative for aneurysm.
IMPRESSION: Brain MRI:

1. No emergent finding.
2. Possible small and noncontributory hygromas.

Intracranial MRA:

Negative.

## 2021-10-30 ENCOUNTER — Ambulatory Visit (LOCAL_COMMUNITY_HEALTH_CENTER): Payer: Medicare Other

## 2021-10-30 DIAGNOSIS — Z23 Encounter for immunization: Secondary | ICD-10-CM | POA: Diagnosis not present

## 2021-10-30 DIAGNOSIS — Z719 Counseling, unspecified: Secondary | ICD-10-CM

## 2021-10-30 NOTE — Progress Notes (Signed)
  Are you feeling sick today? No   Have you ever received a dose of COVID-19 Vaccine? [employer], [place], [place], [place], Other) Yes  If yes, which vaccine and how many doses?    [place] 5  Did you bring the vaccination record card or other documentation?  Yes   Do you have a health condition or are undergoing treatment that makes you moderately or severely immunocompromised? This would include, but not be limited to: cancer, HIV, organ transplant, immunosuppressive therapy/high-dose corticosteroids, or moderate/severe primary immunodeficiency.  No  Have you received COVID-19 vaccine before or during hematopoietic cell transplant (HCT) or CAR-T-cell therapies? No  Have you ever had an allergic reaction to: (This would include a severe allergic reaction or a reaction that caused hives, swelling, or respiratory distress, including wheezing.) A component of a COVID-19 vaccine or a previous dose of COVID-19 vaccine? No   Have you ever had an allergic reaction to another vaccine (other thanCOVID-19 vaccine) or an injectable medication? (This would include a severe allergic reaction or a reaction that caused hives, swelling, or respiratory distress, including wheezing.)   No    Do you have a history of any of the following:  Myocarditis or Pericarditis Yes states he has been told in the past he has "carditis"  but not since 2017.  No problem with past covid vaccines per pt.    Dermal fillers:  No  Multisystem Inflammatory Syndrome (MIS-C or MIS-A)? No  COVID-19 disease within the past 3 months? No  Vaccinated with monkeypox vaccine in the last 4 weeks? No  Comirnaty 2023-24 12+ and flu vaccine administered; tolerated well.  VIS given.  [facility] updated and copy to pt.  Advised to wait 15 minutes for observation.    Tonny Branch, RN

## 2022-10-28 ENCOUNTER — Ambulatory Visit: Payer: Medicare Other

## 2022-10-28 DIAGNOSIS — Z719 Counseling, unspecified: Secondary | ICD-10-CM

## 2022-10-28 DIAGNOSIS — Z23 Encounter for immunization: Secondary | ICD-10-CM

## 2022-10-28 NOTE — Progress Notes (Signed)
In nurse clinic for [employer] Comirnaty [phone] (12 yrs+) and High Dose Flu. Both vaccines given and tolerated well. Updated [facility] Copy given and explained. Declines to stay for 15 min observation. Jerel Shepherd, RN
